# Patient Record
Sex: Male | Born: 1955 | Race: White | Hispanic: No | Marital: Married | Smoking: Former smoker
Health system: Southern US, Community
[De-identification: ages and names within clinical notes are randomized; demographics above are authoritative.]

## PROBLEM LIST (undated history)

## (undated) DIAGNOSIS — I82409 Acute embolism and thrombosis of unspecified deep veins of unspecified lower extremity: Secondary | ICD-10-CM

## (undated) DIAGNOSIS — J849 Interstitial pulmonary disease, unspecified: Secondary | ICD-10-CM

## (undated) DIAGNOSIS — D6851 Activated protein C resistance: Secondary | ICD-10-CM

## (undated) HISTORY — DX: Activated protein C resistance: D68.51

## (undated) HISTORY — DX: Acute embolism and thrombosis of unspecified deep veins of unspecified lower extremity: I82.409

## (undated) HISTORY — PX: LUNG TRANSPLANT: SHX234

## (undated) HISTORY — PX: KNEE SURGERY: SHX244

---
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
## 2016-12-26 ENCOUNTER — Emergency Department (HOSPITAL_COMMUNITY)
Admission: EM | Admit: 2016-12-26 | Discharge: 2016-12-26 | Disposition: A | Discharge status: Home / Self Care | Payer: 59 | Attending: Emergency Medicine | Admitting: Emergency Medicine

## 2016-12-26 ENCOUNTER — Encounter (HOSPITAL_COMMUNITY): Payer: Self-pay | Admitting: Emergency Medicine

## 2016-12-26 DIAGNOSIS — Z79899 Other long term (current) drug therapy: Secondary | ICD-10-CM | POA: Diagnosis not present

## 2016-12-26 DIAGNOSIS — M7989 Other specified soft tissue disorders: Secondary | ICD-10-CM

## 2016-12-26 DIAGNOSIS — F17200 Nicotine dependence, unspecified, uncomplicated: Secondary | ICD-10-CM | POA: Insufficient documentation

## 2016-12-26 DIAGNOSIS — I83891 Varicose veins of right lower extremities with other complications: Secondary | ICD-10-CM

## 2016-12-26 DIAGNOSIS — R2241 Localized swelling, mass and lump, right lower limb: Secondary | ICD-10-CM | POA: Insufficient documentation

## 2016-12-26 LAB — CBC WITH DIFFERENTIAL/PLATELET
BASOS ABS: 0 10*3/uL (ref 0.0–0.1)
BASOS PCT: 0 %
Eosinophils Absolute: 0.3 10*3/uL (ref 0.0–0.7)
Eosinophils Relative: 4 %
HEMATOCRIT: 42.1 % (ref 39.0–52.0)
HEMOGLOBIN: 14.8 g/dL (ref 13.0–17.0)
LYMPHS PCT: 18 %
Lymphs Abs: 1.5 10*3/uL (ref 0.7–4.0)
MCH: 29.4 pg (ref 26.0–34.0)
MCHC: 35.2 g/dL (ref 30.0–36.0)
MCV: 83.5 fL (ref 78.0–100.0)
MONO ABS: 1 10*3/uL (ref 0.1–1.0)
Monocytes Relative: 12 %
NEUTROS ABS: 5.6 10*3/uL (ref 1.7–7.7)
NEUTROS PCT: 66 %
Platelets: 193 10*3/uL (ref 150–400)
RBC: 5.04 MIL/uL (ref 4.22–5.81)
RDW: 13.7 % (ref 11.5–15.5)
WBC: 8.5 10*3/uL (ref 4.0–10.5)

## 2016-12-26 LAB — BASIC METABOLIC PANEL
ANION GAP: 6 (ref 5–15)
BUN: 21 mg/dL — ABNORMAL HIGH (ref 6–20)
CALCIUM: 9.1 mg/dL (ref 8.9–10.3)
CHLORIDE: 106 mmol/L (ref 101–111)
CO2: 28 mmol/L (ref 22–32)
Creatinine, Ser: 0.99 mg/dL (ref 0.61–1.24)
GFR calc non Af Amer: >60 mL/min (ref >60)
GLUCOSE: 98 mg/dL (ref 65–99)
POTASSIUM: 4.6 mmol/L (ref 3.5–5.1)
Sodium: 140 mmol/L (ref 135–145)

## 2016-12-26 MED ORDER — ENOXAPARIN SODIUM 150 MG/ML ~~LOC~~ SOLN
125.0000 mg | Freq: Once | SUBCUTANEOUS | Status: AC
  Administered 2016-12-26: 125 mg via SUBCUTANEOUS
  Filled 2016-12-26: qty 0.83

## 2016-12-26 NOTE — ED Triage Notes (Signed)
Patient c/o right lower leg swelling for a couple months. Came in today due to "a gust of bleeding from it." Patient has some varicose veins noted right above bandage. Bleeding controlled. No blood thinners.

## 2016-12-26 NOTE — ED Provider Notes (Signed)
[facility]-EMERGENCY DEPT Provider Note   CSN: [id number] Arrival date & time: 12/26/16  1924     History   Chief Complaint No chief complaint on file.   HPI Brent Holloway is a 61 y.o. male.  The history is provided by the patient.   CC: Swelling  Onset/Duration: 2-3 months Timing: Constant, worsening Location: Right lower extremity Severity: Moderate Modifying Factors:  Improved by: Nothing tried  Worsened by: Nothing tried Associated Signs/Symptoms:  Pertinent (+): New varicose veins which ruptured today; currently hemostatic  Pertinent (-): Pain, erythema. Chest pain, shortness of breath. Context: The patient denies any recent travels, surgeries, hormone replacement therapy, personal history of cancer, history of clotting disorders, no prior blood clots.  History reviewed. No pertinent past medical history.  There are no active problems to display for this patient.   Past Surgical History:  Procedure Laterality Date  . APPENDECTOMY  1976  . KNEE SURGERY Right        Home Medications    Prior to Admission medications   Medication Sig Start Date End Date Taking? Authorizing Provider  cetirizine (ZYRTEC) 10 MG tablet Take 10 mg by mouth daily.   Yes [provider]    Family History No family history on file.  Social History Social History  Substance Use Topics  . Smoking status: Current Every Day Smoker  . Smokeless tobacco: Not on file  . Alcohol use Not on file     Allergies   Patient has no known allergies.   Review of Systems Review of Systems  All other systems are reviewed and are negative for acute change except as noted in the HPI  Physical Exam Updated Vital Signs BP 125/83 (BP Location: Left Arm)   Pulse 78   Temp 97.8 F (36.6 C) (Oral)   Resp 18   SpO2 99%   Physical Exam  Constitutional: He is oriented to person, place, and time. He appears well-developed and well-nourished. No distress.  HENT:  Head: Normocephalic  and atraumatic.  Right Ear: External ear normal.  Left Ear: External ear normal.  Nose: Nose normal.  Mouth/Throat: Mucous membranes are normal. No trismus in the jaw.  Eyes: Conjunctivae and EOM are normal. No scleral icterus.  Neck: Normal range of motion and phonation normal.  Cardiovascular: Normal rate and regular rhythm.   Pulmonary/Chest: Effort normal. No stridor. No respiratory distress.  Abdominal: He exhibits no distension.  Musculoskeletal: Normal range of motion. He exhibits no edema.  Right lower extremity edema. Skin changes from prolonged edema. Varicose veins to the proximal aspect of the right lower leg, currently hemostatic. No erythema.  Neurological: He is alert and oriented to person, place, and time.  Skin: He is not diaphoretic.  Psychiatric: He has a normal mood and affect. His behavior is normal.  Vitals reviewed.    ED Treatments / Results  Labs (all labs ordered are listed, but only abnormal results are displayed) Labs Reviewed  BASIC METABOLIC PANEL - Abnormal; Notable for the following:       Result Value   BUN 21 (*)    All other components within normal limits  CBC WITH DIFFERENTIAL/PLATELET    EKG  EKG Interpretation None       Radiology No results found.  Procedures Procedures (including critical care time)  Medications Ordered in ED Medications  enoxaparin (LOVENOX) injection 125 mg (125 mg Subcutaneous Given 12/26/16 2331)     Initial Impression / Assessment and Plan / ED Course  I have  reviewed the triage vital signs and the nursing notes.  Pertinent labs & imaging results that were available during my care of the patient were reviewed by me and considered in my medical decision making (see chart for details).     Right lower leg swelling. Concerning for DVT. No evidence of compartment syndrome. Varicose veins hemostatic.  Unable to obtain a DVT ultrasound at this time. Screening labs obtained which revealed no evidence of  anemia, thrombocytopenia, or renal insufficiency.  Patient provided with a treatment dose of Lovenox. Varicose veins were bandaged. Patient provided with Pro- clot, in case the varicose veins bleed again.   [facility] for lower extremity DVT study in the morning.  The patient is safe for discharge with strict return precautions.   Final Clinical Impressions(s) / ED Diagnoses   Final diagnoses:  Right leg swelling  Varicose veins of right leg with edema   Disposition: Discharge  Condition: Good  I have discussed the results, Dx and Tx plan with the patient who expressed understanding and agree(s) with the plan. Discharge instructions discussed at great length. The patient was given strict return precautions who verbalized understanding of the instructions. No further questions at time of discharge.    New Prescriptions   No medications on file    Follow Up: [facility] [address] [place] [phone]    Brent LaddWong, Francis P, MD [address] [place] [address] [phone]  Schedule an appointment as soon as possible for a visit  if DVT study is negative      Cardama, Amadeo GarnetPedro Eduardo, MD 12/26/16 [phone]

## 2016-12-26 NOTE — ED Notes (Signed)
Cleansed right lower leg with normal saline and patted dry with gauze. Wrapped with kerlix and ace wrap.

## 2016-12-27 ENCOUNTER — Emergency Department (HOSPITAL_COMMUNITY): Admission: EM | Admit: 2016-12-27 | Payer: 59 | Source: Home / Self Care

## 2016-12-27 ENCOUNTER — Ambulatory Visit (HOSPITAL_BASED_OUTPATIENT_CLINIC_OR_DEPARTMENT_OTHER)
Admission: RE | Admit: 2016-12-27 | Discharge: 2016-12-27 | Disposition: A | Discharge status: Home / Self Care | Payer: 59 | Source: Ambulatory Visit | Attending: Emergency Medicine | Admitting: Emergency Medicine

## 2016-12-27 ENCOUNTER — Emergency Department (HOSPITAL_COMMUNITY)
Admission: EM | Admit: 2016-12-27 | Discharge: 2016-12-27 | Disposition: A | Discharge status: Home / Self Care | Payer: 59 | Attending: Emergency Medicine | Admitting: Emergency Medicine

## 2016-12-27 ENCOUNTER — Encounter (HOSPITAL_COMMUNITY): Payer: Self-pay | Admitting: Emergency Medicine

## 2016-12-27 ENCOUNTER — Encounter (HOSPITAL_COMMUNITY): Payer: Self-pay

## 2016-12-27 DIAGNOSIS — M7989 Other specified soft tissue disorders: Secondary | ICD-10-CM | POA: Insufficient documentation

## 2016-12-27 DIAGNOSIS — F17290 Nicotine dependence, other tobacco product, uncomplicated: Secondary | ICD-10-CM | POA: Diagnosis not present

## 2016-12-27 DIAGNOSIS — I824Z1 Acute embolism and thrombosis of unspecified deep veins of right distal lower extremity: Secondary | ICD-10-CM | POA: Diagnosis not present

## 2016-12-27 MED ORDER — RIVAROXABAN (XARELTO) EDUCATION KIT FOR DVT/PE PATIENTS
PACK | Freq: Once | Status: AC
  Administered 2016-12-27: 11:00:00
  Filled 2016-12-27: qty 1

## 2016-12-27 MED ORDER — RIVAROXABAN 15 MG PO TABS
15.0000 mg | ORAL_TABLET | Freq: Once | ORAL | Status: AC
  Administered 2016-12-27: 15 mg via ORAL
  Filled 2016-12-27: qty 1

## 2016-12-27 MED ORDER — RIVAROXABAN (XARELTO) VTE STARTER PACK (15 & 20 MG): ORAL_TABLET | ORAL | 0 refills | Status: DC

## 2016-12-27 NOTE — ED Triage Notes (Signed)
Pt [facility]. Sent here to have vascular study for possible DVT. Pt denies any long travel. Pt denies shortness of breath.

## 2016-12-27 NOTE — Progress Notes (Signed)
VASCULAR LAB PRELIMINARY  PRELIMINARY  PRELIMINARY  PRELIMINARY  Right lower extremity venous duplex completed.    Preliminary report:  There is subacute DVT noted in the right mid to distal femoral, popliteal, posterior tibial, and peroneal veins.  There is no propagation noted to the left lower extremity.   Koralyn Prestage, RVT 12/27/2016, 9:33 AM

## 2016-12-27 NOTE — ED Notes (Signed)
PT instructed to follow up with PCP for management and continuation of Xarelto. PT has no questions at discharge. PT instructed to return to ED for new or worsening symptoms.

## 2016-12-27 NOTE — ED Provider Notes (Signed)
This patient was here for an ultrasound study only. Has no new complaints at this time. Will return for encounter if US is positive for DVT.   SHOULD NOT BE CHARGED FOR THIS EMERGENCY DEPARTMENT ENCOUNTER.      Avice Funchess, Barbara CowerJason, MD 12/27/16 0900

## 2016-12-27 NOTE — ED Notes (Signed)
Pt scheduled to have vascular study done. Pt taken to lobby and ultrasound tech will come to get him.

## 2016-12-27 NOTE — ED Provider Notes (Signed)
[facility] DEPT Provider Note   CSN: [id number] Arrival date & time: 12/27/16  2297     History   Chief Complaint Chief Complaint  Patient presents with  . DVT    HPI Brent Holloway is a 61 y.o. male.   approximately 3 months of uchanged right lower extremtiy swelling with redness but no pain, fever, warmth. Seen at [facility] ED last night and returned for DVT study here today which was positive for DVT as seen below.  No leg pain, worsening swelling, chest pain, palpitations, sob or other new concerns. On review of records from [place] yesterday, kidney function ok, VS WNL.   History reviewed. No pertinent past medical history.  There are no active problems to display for this patient.   Past Surgical History:  Procedure Laterality Date  . APPENDECTOMY  1976  . KNEE SURGERY Right        Home Medications    Prior to Admission medications   Medication Sig Start Date End Date Taking? Authorizing Provider  cetirizine (ZYRTEC) 10 MG tablet Take 10 mg by mouth daily.    [provider]  Rivaroxaban 15 & 20 MG TBPK Take as directed on package: Start with one 46m tablet by mouth twice a day with food. On Day 22, switch to one 215mtablet once a day with food. 12/27/16   Carlean Crowl, JaCorene CorneaMD    Family History No family history on file.  Social History Social History  Substance Use Topics  . Smoking status: Current Every Day Smoker    Types: Cigars  . Smokeless tobacco: Never Used  . Alcohol use Yes     Comment: occ     Allergies   Patient has no known allergies.   Review of Systems Review of Systems  All other systems reviewed and are negative.    Physical Exam Updated Vital Signs BP 105/80   Pulse 74   Temp 98.7 F (37.1 C) (Oral)   Resp 16   Ht 6' (1.829 m)   Wt 124.7 kg (275 lb)   SpO2 97%   BMI 37.30 kg/m   Physical Exam  Constitutional: He appears well-developed and well-nourished.  HENT:  Head: Normocephalic and atraumatic.  Eyes:  Conjunctivae and EOM are normal.  Neck: Normal range of motion.  Cardiovascular: Normal rate.   Pulmonary/Chest: Effort normal. No respiratory distress.  Abdominal: Soft. He exhibits no distension.  Musculoskeletal: Normal range of motion. He exhibits edema (RLE with erythema, no tenderness). He exhibits no tenderness or deformity.  Neurological: He is alert.  Skin: Skin is warm and dry. There is erythema.  Nursing note and vitals reviewed.    ED Treatments / Results  Labs (all labs ordered are listed, but only abnormal results are displayed) Labs Reviewed - No data to display  EKG  EKG Interpretation None       Radiology No results found.  Procedures Procedures (including critical care time)  KaSharion Dove, RVS  Vascular Lab    VASCULAR LAB PRELIMINARY  PRELIMINARY  PRELIMINARY  PRELIMINARY  Right lower extremity venous duplex completed.    Preliminary report:  There is subacute DVT noted in the right mid to distal femoral, popliteal, posterior tibial, and peroneal veins.  There is no propagation noted to the left lower extremity.   KANADY, CANDACE, RVT 12/27/2016, 9:33 AM       Medications Ordered in ED Medications  rivaroxaban (XAlveda ReasonsEducation Kit for DVT/PE patients ( Does not apply Given 12/27/16 1040)  Rivaroxaban (XARELTO) tablet 15 mg (15 mg Oral Given 12/27/16 1040)     Initial Impression / Assessment and Plan / ED Course  I have reviewed the triage vital signs and the nursing notes.  Pertinent labs & imaging results that were available during my care of the patient were reviewed by me and considered in my medical decision making (see chart for details).    Positive DVT, no e/o PE. Been going on for 2-3 months, after review of vascular indciations for intervention, not a candidate at this time. No evidence of phlegmasia. Stable for dc on Xarelto. Will initiate here and starter pack Rx.    Final Clinical Impressions(s) / ED Diagnoses   Final  diagnoses:  Acute deep vein thrombosis (DVT) of distal vein of right lower extremity ([place])    New Prescriptions Discharge Medication List as of 12/27/2016 10:59 AM    START taking these medications   Details  Rivaroxaban 15 & 20 MG TBPK Take as directed on package: Start with one 6m tablet by mouth twice a day with food. On Day 22, switch to one 224mtablet once a day with food., Print         Sayyid Harewood, JaCorene CorneaMD 12/28/16 [phone]

## 2017-01-12 ENCOUNTER — Telehealth: Payer: Self-pay | Admitting: Hematology

## 2017-01-12 NOTE — Telephone Encounter (Signed)
Spoke with patient re 7/16 new patient appointment with Dr. Mosetta PuttFeng at 11 am to arrive 10:30 am. Demographic and insurance information confirmed.

## 2017-01-30 NOTE — Progress Notes (Signed)
[place]  [phone] [phone]  Clinic New Consult Note   Patient Care Team: Vernie Shanks, MD as PCP - General (Family Medicine) 02/02/2017  CHIEF COMPLAINTS/PURPOSE OF CONSULTATION:  subacute DVT of right LE  Referred By: Dr. Jacelyn Grip  HISTORY OF PRESENTING ILLNESS:  Brent Holloway 61 y.o. male is here because of subacute DVT of right lower extremity. He was referred by his primary care physician .he presents to my clinic by himself today.   He woke up one morning with pain and swelling down his right leg in late April 2018. The pain stopped spontaneously, but is swollen persistent. He did not seek medical attention until 12/27/2016, when his wife made him go to the hospital due to a popped blood vessel and  complaints of unchanged right lower leg swelling for the past three  months. He had a vascular study done that confirmed a subacute DVT in the right mid to distal femoral, popliteal, posterior tibial and peroneal veins. He denies any leg pain, worsening swelling, chest pains, palpitations, sob, fever or warmth while at the ED.  Today, he presents to the clinic to discuss treatment. His right leg is still slightly red and swollen. He has compression bandage. Denied any SOB or pain. He is a Dealer and works on trucks. He admits to sitting more than he should. He does household activities like mowing the lawn. He has been smoking cigar for about 20 years, 1-2 daily, which he is trying to stop. He denies any night sweats or itchiness.  He denies recent chest pain on exertion, shortness of breath on minimal exertion, pre-syncopal episodes, hemoptysis, or palpitation. he denies recent history of trauma,  long distance travel, dehydration, recent surgery, prolonged immobilization. He smokes Cigars a few daily he had no prior history or diagnosis of cancer. He  he had prior surgeries before and never had perioperative thromboembolic events. The patient  has never been exposed to hormone replacement therapy, testosterone replacement therapy and never had thrombotic events. There is no family history of blood clots or miscarriages.  MEDICAL HISTORY:  History reviewed. No pertinent past medical history.  SURGICAL HISTORY: Past Surgical History:  Procedure Laterality Date  . APPENDECTOMY  1976  . KNEE SURGERY Right     SOCIAL HISTORY: Social History   Social History  . Marital status: Married    Spouse name: N/A  . Number of children: N/A  . Years of education: N/A   Occupational History  . Not on file.   Social History Main Topics  . Smoking status: Current Every Day Smoker    Packs/day: 0.25    Years: 40.00    Types: Cigars  . Smokeless tobacco: Never Used  . Alcohol use Yes     Comment: occ  . Drug use: No  . Sexual activity: Not on file   Other Topics Concern  . Not on file   Social History Narrative  . No narrative on file    FAMILY HISTORY: Family History  Problem Relation Age of Onset  . Heart disease Father     ALLERGIES:  has No Known Allergies.  MEDICATIONS:  Current Outpatient Prescriptions  Medication Sig Dispense Refill  . cetirizine (ZYRTEC) 10 MG tablet Take 10 mg by mouth daily.    . Omega-3 Fatty Acids (FISH OIL) 1000 MG CAPS Take 1 capsule by mouth daily.    Alveda Reasons 20 MG TABS tablet Take 20 mg by mouth daily.  2  No current facility-administered medications for this visit.     REVIEW OF SYSTEMS:   Constitutional: Denies fevers, chills or abnormal night sweats Eyes: Denies blurriness of vision, double vision or watery eyes Ears, nose, mouth, throat, and face: Denies mucositis or sore throat Respiratory: Denies cough, dyspnea or wheezes Cardiovascular: Denies palpitation, chest discomfort or lower extremity swelling Gastrointestinal:  Denies nausea, heartburn or change in bowel habits Skin: Denies abnormal skin rashes Lymphatics: Denies new lymphadenopathy or easy  bruising Neurological:Denies numbness, tingling or new weaknesses Behavioral/Psych: Mood is stable, no new changes  MSK: (+)erythema and swelling of right leg All other systems were reviewed with the patient and are negative.  PHYSICAL EXAMINATION:  ECOG PERFORMANCE STATUS: 0 - Asymptomatic  Vitals:   02/02/17 1127  BP: (!) 154/82  Pulse: 71  Resp: 19  Temp: 98.1 F (36.7 C)   Filed Weights   02/02/17 1127  Weight: 287 lb 6.4 oz (130.4 kg)    GENERAL:alert, no distress and comfortable SKIN: skin color, texture, turgor are normal, no rashes or significant lesions (+) Diffuse mild skin erythema in the right lower extremity below knee. EYES: normal, conjunctiva are pink and non-injected, sclera clear OROPHARYNX:no exudate, no erythema and lips, buccal mucosa, and tongue normal  NECK: supple, thyroid normal size, non-tender, without nodularity LYMPH:  no palpable lymphadenopathy in the cervical, axillary or inguinal LUNGS: clear to auscultation and percussion with normal breathing effort HEART: regular rate & rhythm and no murmurs and no lower extremity edema ABDOMEN:abdomen soft, non-tender and normal bowel sounds  Musculoskeletal:no cyanosis of digits and no clubbing (+)erythema and swelling of right leg up to knee. PSYCH: alert & oriented x 3 with fluent speech NEURO: no focal motor/sensory deficits  LABORATORY DATA:  I have reviewed the data as listed Recent Results (from the past 2160 hour(s))  CBC with Differential/Platelet     Status: None   Collection Time: 12/26/16 10:02 PM  Result Value Ref Range   WBC 8.5 4.0 - 10.5 K/uL   RBC 5.04 4.22 - 5.81 MIL/uL   Hemoglobin 14.8 13.0 - 17.0 g/dL   HCT 42.1 39.0 - 52.0 %   MCV 83.5 78.0 - 100.0 fL   MCH 29.4 26.0 - 34.0 pg   MCHC 35.2 30.0 - 36.0 g/dL   RDW 13.7 11.5 - 15.5 %   Platelets 193 150 - 400 K/uL   Neutrophils Relative % 66 %   Neutro Abs 5.6 1.7 - 7.7 K/uL   Lymphocytes Relative 18 %   Lymphs Abs 1.5 0.7 -  4.0 K/uL   Monocytes Relative 12 %   Monocytes Absolute 1.0 0.1 - 1.0 K/uL   Eosinophils Relative 4 %   Eosinophils Absolute 0.3 0.0 - 0.7 K/uL   Basophils Relative 0 %   Basophils Absolute 0.0 0.0 - 0.1 K/uL  Basic metabolic panel     Status: Abnormal   Collection Time: 12/26/16 10:02 PM  Result Value Ref Range   Sodium 140 135 - 145 mmol/L   Potassium 4.6 3.5 - 5.1 mmol/L   Chloride 106 101 - 111 mmol/L   CO2 28 22 - 32 mmol/L   Glucose, Bld 98 65 - 99 mg/dL   BUN 21 (H) 6 - 20 mg/dL   Creatinine, Ser 0.99 0.61 - 1.24 mg/dL   Calcium 9.1 8.9 - 10.3 mg/dL   GFR calc non Af Amer >60 >60 mL/min   GFR calc Af Amer >60 >60 mL/min    Comment: (NOTE) The eGFR  has been calculated using the CKD EPI equation. This calculation has not been validated in all clinical situations. eGFR's persistently <60 mL/min signify possible Chronic Kidney Disease.    Anion gap 6 5 - 15    RADIOGRAPHIC STUDIES: I have personally reviewed the radiological images as listed and agreed with the findings in the report. No results found.  ASSESSMENT:  61 year old Caucasian male without past medical history, presented with right leg pain and edema  1. Unprovoked right lower extremity DVT, subacute   This is his first episode of thrombosis, unprovoked.   Hypercoagulopathy workup We discussed about the pros and cons about testing for thrombophilia disorder. his current on anticoagulation therapy will interfere with some the tests and it is not possible to interpret the test results. Taking him off the anticoagulation therapy to do the tests may precipitate another thrombotic event. I will recommend him to be tested for prothrombin gene mutation and factor V Leiden which are not interfered by his anticoagulation. I would consider to complete the rest of hypercoagulation workup after he is off anticoagulation.  We'll discussed risk factors for thrombosis, such as smoking, obesity, sedentary lifestyle, family  history, etc. His moderate smoking history and obesity are his risk factors. He is pretty physically active. Due to his age, never had cancer screening, unprovoked DVT, I recommend him to ruled out malignancy, we'll obtain a CT chest, abdomen and pelvis with contrast. His CBC is normal, no sings for myeloproliferative disorder. I also strongly encouraging him to have PSA test and screening colonoscopy for cancer screening at her primary care physician's office.  Duration of anticoagulation Giving the severity of his thrombosis, I recommend continue Xarelto for 6 months. If his factor V Leyden and a prothrombin gene mutation were negative, I will repeat a Doppler of right lower extremity in 6 months, if it is negative, will take him off Xarelto after 6 months of therapy, and do the rest of hypercoagulation workup. If he does have hypercoagulopathy, I would recommend continue anticoagulation indefinitely.   On the other hand, giving his unprovoked DVT and family history of thrombosis, it is not unreasonable to continue A/C indefinitely.  Anticoagulation options We discussed about various options of anticoagulation therapies including warfarin, low molecular weight heparin such as Lovenox or newer agents such as Rivaroxaban. Some of the risks and benefits discussed including costs involved, the need for monitoring, risks of life-threatening bleeding/hospitalization, reversibility of each agent in the event of bleeding or overdose, safety profile of each drug and taking into account other social issues such as ease of administration of medications, etc. Ultimately, we have made an informed decision for the patient to continue his treatment with Xarelto.  Other preventive strategy for DVT  I recommend the patient to use elastic compression stockings at 20-30 mmHg to reduce risks of chronic thrombophlebitis.  Finally, at the end of our consultation today, I reinforced the importance of preventive strategies  such as avoiding hormonal supplement, avoiding cigarette smoking, keeping up-to-date with screening programs for early cancer detection, frequent ambulation for long distance travel and aggressive DVT prophylaxis in all surgical settings.  Family counseling Another main issue we discussed today included the role of screening other family members for thrombophilia disorder. At present time, I would not recommend testing the patient's family members as it would not benefit them. He does not have children.  Recommendations:  - I strongly encouraged him to quit smoking, eat healthier, exercise and try to lose weight -Use compression stock to  reduce his swelling of the right leg, and prevent DVT  -We'll obtain a CT chest, abdomen and pelvis with contrast to ruled out malignancy. - I encouraged him to begin cancer screenings. I encouraged him to have PSA screening test and colonoscopy by Dr. Jacelyn Grip.  -He will f/u with Dr. Jacelyn Grip later this week. - I recommend staying on Xarelto for 6 months if repeated Doppler of right lower extremity becomes negative for DVT.  I will copy my note to Dr. Jacelyn Grip - f/u in 6 months with R LE Doppler one week before, lab today for LETs, prothrombin gene mutation and factor V Leyden mutation    Orders Placed This Encounter  Procedures  . CT Abdomen Pelvis W Contrast    Standing Status:   Future    Standing Expiration Date:   02/02/2018    Order Specific Question:   If indicated for the ordered procedure, I authorize the administration of contrast media per Radiology protocol    Answer:   Yes    Order Specific Question:   Reason for Exam (SYMPTOM  OR DIAGNOSIS REQUIRED)    Answer:   rule out malignancy    Order Specific Question:   Preferred imaging location?    Answer:   [facility]    Order Specific Question:   Radiology Contrast Protocol - do NOT remove file path    Answer:   \\[email or web address]  . CT Chest W Contrast    Standing  Status:   Future    Standing Expiration Date:   02/02/2018    Order Specific Question:   If indicated for the ordered procedure, I authorize the administration of contrast media per Radiology protocol    Answer:   Yes    Order Specific Question:   Reason for Exam (SYMPTOM  OR DIAGNOSIS REQUIRED)    Answer:   rule out malignancy    Order Specific Question:   Preferred imaging location?    Answer:   [facility]    Order Specific Question:   Radiology Contrast Protocol - do NOT remove file path    Answer:   \\[email or web address]  . Hepatic function panel    Standing Status:   Future    Number of Occurrences:   1    Standing Expiration Date:   02/02/2018  . Prothrombin gene mutation*    Standing Status:   Future    Number of Occurrences:   1    Standing Expiration Date:   02/02/2018  . Factor 5 Leiden*    Standing Status:   Future    Number of Occurrences:   1    Standing Expiration Date:   02/02/2018    All questions were answered. The patient knows to call the clinic with any problems, questions or concerns. I spent 40 minutes counseling the patient face to face. The total time spent in the appointment was 45 minutes and more than 50% was on counseling.   This document serves as a record of services personally performed by Truitt Merle, MD. It was created on her behalf by Brandt Loosen, a trained medical scribe. The creation of this record is based on the scribe's personal observations and the provider's statements to them. This document has been checked and approved by the attending provider.   Truitt Merle, MD 02/02/2017

## 2017-02-02 ENCOUNTER — Other Ambulatory Visit (HOSPITAL_BASED_OUTPATIENT_CLINIC_OR_DEPARTMENT_OTHER): Payer: 59

## 2017-02-02 ENCOUNTER — Encounter: Payer: Self-pay | Admitting: Hematology

## 2017-02-02 ENCOUNTER — Ambulatory Visit (HOSPITAL_BASED_OUTPATIENT_CLINIC_OR_DEPARTMENT_OTHER): Payer: 59 | Admitting: Hematology

## 2017-02-02 ENCOUNTER — Encounter (INDEPENDENT_AMBULATORY_CARE_PROVIDER_SITE_OTHER): Payer: Self-pay

## 2017-02-02 ENCOUNTER — Telehealth: Payer: Self-pay | Admitting: Hematology

## 2017-02-02 DIAGNOSIS — I82431 Acute embolism and thrombosis of right popliteal vein: Secondary | ICD-10-CM

## 2017-02-02 DIAGNOSIS — I82501 Chronic embolism and thrombosis of unspecified deep veins of right lower extremity: Secondary | ICD-10-CM | POA: Insufficient documentation

## 2017-02-02 DIAGNOSIS — I82441 Acute embolism and thrombosis of right tibial vein: Secondary | ICD-10-CM

## 2017-02-02 DIAGNOSIS — I82890 Acute embolism and thrombosis of other specified veins: Secondary | ICD-10-CM | POA: Diagnosis not present

## 2017-02-02 DIAGNOSIS — I82411 Acute embolism and thrombosis of right femoral vein: Secondary | ICD-10-CM

## 2017-02-02 NOTE — Telephone Encounter (Signed)
Gv pt appt for Jan 2019. Advised radiology will call to sched ct in 2-3 wks per 7/16 los and they will call to sched doppler LLE prior to Jan MD visit. Gv pt contrast and sent to labs for draw.

## 2017-02-03 LAB — HEPATIC FUNCTION PANEL
ALK PHOS: 71 IU/L (ref 39–117)
ALT: 12 IU/L (ref 0–44)
AST (SGOT): 14 IU/L (ref 0–40)
Albumin, Serum: 4.5 g/dL (ref 3.6–4.8)
Bilirubin Total: 0.4 mg/dL (ref 0.0–1.2)
Bilirubin, Direct: 0.12 mg/dL (ref 0.00–0.40)
TOTAL PROTEIN: 6.7 g/dL (ref 6.0–8.5)

## 2017-02-07 LAB — FACTOR 5 LEIDEN

## 2017-02-07 LAB — PROTHROMBIN GENE MUTATION

## 2017-02-23 ENCOUNTER — Telehealth: Payer: Self-pay | Admitting: Hematology

## 2017-02-23 ENCOUNTER — Other Ambulatory Visit: Payer: Self-pay | Admitting: *Deleted

## 2017-02-23 DIAGNOSIS — I82501 Chronic embolism and thrombosis of unspecified deep veins of right lower extremity: Secondary | ICD-10-CM

## 2017-02-23 NOTE — Telephone Encounter (Signed)
sw pt to confirm lab appt 8/9 at 0800 per sch msg

## 2017-02-26 ENCOUNTER — Ambulatory Visit (HOSPITAL_COMMUNITY)
Admission: RE | Admit: 2017-02-26 | Discharge: 2017-02-26 | Disposition: A | Discharge status: Home / Self Care | Payer: 59 | Source: Ambulatory Visit | Attending: Hematology | Admitting: Hematology

## 2017-02-26 ENCOUNTER — Other Ambulatory Visit (HOSPITAL_BASED_OUTPATIENT_CLINIC_OR_DEPARTMENT_OTHER): Payer: 59

## 2017-02-26 DIAGNOSIS — I82501 Chronic embolism and thrombosis of unspecified deep veins of right lower extremity: Secondary | ICD-10-CM

## 2017-02-26 DIAGNOSIS — I700 Atherosclerosis of aorta: Secondary | ICD-10-CM | POA: Insufficient documentation

## 2017-02-26 DIAGNOSIS — R590 Localized enlarged lymph nodes: Secondary | ICD-10-CM | POA: Insufficient documentation

## 2017-02-26 DIAGNOSIS — R918 Other nonspecific abnormal finding of lung field: Secondary | ICD-10-CM | POA: Insufficient documentation

## 2017-02-26 DIAGNOSIS — K449 Diaphragmatic hernia without obstruction or gangrene: Secondary | ICD-10-CM | POA: Diagnosis not present

## 2017-02-26 DIAGNOSIS — I82411 Acute embolism and thrombosis of right femoral vein: Secondary | ICD-10-CM

## 2017-02-26 LAB — BASIC METABOLIC PANEL
Anion Gap: 10 mEq/L (ref 3–11)
BUN: 17.6 mg/dL (ref 7.0–26.0)
CHLORIDE: 104 meq/L (ref 98–109)
CO2: 25 meq/L (ref 22–29)
Calcium: 9.4 mg/dL (ref 8.4–10.4)
Creatinine: 1 mg/dL (ref 0.7–1.3)
EGFR: 79 mL/min/{1.73_m2} — AB (ref >90)
Glucose: 100 mg/dl (ref 70–140)
POTASSIUM: 4.5 meq/L (ref 3.5–5.1)
SODIUM: 139 meq/L (ref 136–145)

## 2017-02-26 MED ORDER — IOPAMIDOL (ISOVUE-300) INJECTION 61%
INTRAVENOUS | Status: AC
  Filled 2017-02-26: qty 100

## 2017-02-26 MED ORDER — IOPAMIDOL (ISOVUE-300) INJECTION 61%
100.0000 mL | Freq: Once | INTRAVENOUS | Status: AC | PRN
  Administered 2017-02-26: 100 mL via INTRAVENOUS

## 2017-03-09 ENCOUNTER — Telehealth: Payer: Self-pay | Admitting: Hematology

## 2017-03-09 NOTE — Telephone Encounter (Signed)
sw pt to confirm 8/21 appt at 0915 per sch msg

## 2017-03-10 ENCOUNTER — Ambulatory Visit (HOSPITAL_BASED_OUTPATIENT_CLINIC_OR_DEPARTMENT_OTHER): Payer: 59 | Admitting: Hematology

## 2017-03-10 ENCOUNTER — Telehealth: Payer: Self-pay | Admitting: Hematology

## 2017-03-10 ENCOUNTER — Encounter: Payer: Self-pay | Admitting: Hematology

## 2017-03-10 VITALS — BP 135/83 | HR 73 | Temp 97.7°F | Resp 20 | Ht 72.0 in | Wt 291.1 lb

## 2017-03-10 DIAGNOSIS — M7989 Other specified soft tissue disorders: Secondary | ICD-10-CM | POA: Diagnosis not present

## 2017-03-10 DIAGNOSIS — I82441 Acute embolism and thrombosis of right tibial vein: Secondary | ICD-10-CM

## 2017-03-10 DIAGNOSIS — R918 Other nonspecific abnormal finding of lung field: Secondary | ICD-10-CM

## 2017-03-10 DIAGNOSIS — F17200 Nicotine dependence, unspecified, uncomplicated: Secondary | ICD-10-CM | POA: Diagnosis not present

## 2017-03-10 DIAGNOSIS — R591 Generalized enlarged lymph nodes: Secondary | ICD-10-CM

## 2017-03-10 DIAGNOSIS — I82890 Acute embolism and thrombosis of other specified veins: Secondary | ICD-10-CM

## 2017-03-10 DIAGNOSIS — R599 Enlarged lymph nodes, unspecified: Secondary | ICD-10-CM

## 2017-03-10 DIAGNOSIS — D6851 Activated protein C resistance: Secondary | ICD-10-CM | POA: Diagnosis not present

## 2017-03-10 DIAGNOSIS — I82411 Acute embolism and thrombosis of right femoral vein: Secondary | ICD-10-CM

## 2017-03-10 DIAGNOSIS — I82431 Acute embolism and thrombosis of right popliteal vein: Secondary | ICD-10-CM

## 2017-03-10 DIAGNOSIS — J849 Interstitial pulmonary disease, unspecified: Secondary | ICD-10-CM

## 2017-03-10 NOTE — Telephone Encounter (Signed)
Scheduled appt per 8/21 los - gave patient AVS and calender per los.  

## 2017-03-10 NOTE — Progress Notes (Signed)
[facility]  [phone] [phone]  Clinic Follow up Note   Patient Care Team: Vernie Shanks, MD as PCP - General (Family Medicine) 03/10/2017  CHIEF COMPLAINTS:  F/u subacute DVT of right LE, factor V Leyden mutation positive  HISTORY OF PRESENTING ILLNESS: 02/02/17 Brent Holloway 61 y.o. male is here because of subacute DVT of right lower extremity. He was referred by his primary care physician .he presents to my clinic by himself today.   He woke up one morning with pain and swelling down his right leg in late April 2018. The pain stopped spontaneously, but is swollen persistent. He did not seek medical attention until 12/27/2016, when his wife made him go to the hospital due to a popped blood vessel and  complaints of unchanged right lower leg swelling for the past three  months. He had a vascular study done that confirmed a subacute DVT in the right mid to distal femoral, popliteal, posterior tibial and peroneal veins. He denies any leg pain, worsening swelling, chest pains, palpitations, sob, fever or warmth while at the ED.  Today, he presents to the clinic to discuss treatment. His right leg is still slightly red and swollen. He has compression bandage. Denied any SOB or pain. He is a Dealer and works on trucks. He admits to sitting more than he should. He does household activities like mowing the lawn. He has been smoking cigar for about 20 years, 1-2 daily, which he is trying to stop. He denies any night sweats or itchiness.  He denies recent chest pain on exertion, shortness of breath on minimal exertion, pre-syncopal episodes, hemoptysis, or palpitation. he denies recent history of trauma,  long distance travel, dehydration, recent surgery, prolonged immobilization. He smokes Cigars a few daily he had no prior history or diagnosis of cancer. He  he had prior surgeries before and never had perioperative thromboembolic events. The patient has  never been exposed to hormone replacement therapy, testosterone replacement therapy and never had thrombotic events. There is no family history of blood clots or miscarriages.    CURRENT THERAPY:  Xarelto 4m once dialy   INTERVAL HISTORY:  Brent Holloway here for a follow up. He presents to the clinic today with his wife. He reports his right leg is still swollen some but no pain. In the morning he will cough aggressively and his nose will run. He reports to recently stopping smoking. They are willing to notify family of positive gene mutation.    MEDICAL HISTORY:  History reviewed. No pertinent past medical history.  SURGICAL HISTORY: Past Surgical History:  Procedure Laterality Date  . APPENDECTOMY  1976  . KNEE SURGERY Right     SOCIAL HISTORY: Social History   Social History  . Marital status: Married    Spouse name: N/A  . Number of children: N/A  . Years of education: N/A   Occupational History  . Not on file.   Social History Main Topics  . Smoking status: Current Every Day Smoker    Packs/day: 0.25    Years: 40.00    Types: Cigars  . Smokeless tobacco: Never Used  . Alcohol use Yes     Comment: occ  . Drug use: No  . Sexual activity: Not on file   Other Topics Concern  . Not on file   Social History Narrative  . No narrative on file    FAMILY HISTORY: Family History  Problem Relation Age of Onset  . Heart  disease Father     ALLERGIES:  has No Known Allergies.  MEDICATIONS:  Current Outpatient Prescriptions  Medication Sig Dispense Refill  . cetirizine (ZYRTEC) 10 MG tablet Take 10 mg by mouth daily.    . Omega-3 Fatty Acids (FISH OIL) 1000 MG CAPS Take 1 capsule by mouth daily.    Alveda Reasons 20 MG TABS tablet Take 20 mg by mouth daily.  2   No current facility-administered medications for this visit.     REVIEW OF SYSTEMS:   Constitutional: Denies fevers, chills or abnormal night sweats Eyes: Denies blurriness of vision, double vision  or watery eyes Ears, nose, mouth, throat, and face: Denies mucositis or sore throat Respiratory: Denies dyspnea or wheezes  (+) coughing and runny nose mostly in mornings Cardiovascular: Denies palpitation, chest discomfort or lower extremity swelling Gastrointestinal:  Denies nausea, heartburn or change in bowel habits Skin: Denies abnormal skin rashes Lymphatics: Denies new lymphadenopathy or easy bruising Neurological:Denies numbness, tingling or new weaknesses Behavioral/Psych: Mood is stable, no new changes  MSK: (+)erythema and swelling of right leg All other systems were reviewed with the patient and are negative.  PHYSICAL EXAMINATION:  ECOG PERFORMANCE STATUS: 0 - Asymptomatic  Vitals:   03/10/17 0925  BP: 135/83  Pulse: 73  Resp: 20  Temp: 97.7 F (36.5 C)  SpO2: 99%   Filed Weights   03/10/17 0925  Weight: 291 lb 1.6 oz (132 kg)    GENERAL:alert, no distress and comfortable SKIN: skin color, texture, turgor are normal, no rashes or significant lesions (+) Diffuse mild skin erythema in the right lower extremity below knee. EYES: normal, conjunctiva are pink and non-injected, sclera clear OROPHARYNX:no exudate, no erythema and lips, buccal mucosa, and tongue normal  NECK: supple, thyroid normal size, non-tender, without nodularity LYMPH:  no palpable lymphadenopathy in the cervical, axillary or inguinal LUNGS: clear to auscultation and percussion with normal breathing effort HEART: regular rate & rhythm and no murmurs and no lower extremity edema ABDOMEN:abdomen soft, non-tender and normal bowel sounds  Musculoskeletal:no cyanosis of digits and no clubbing (+)erythema and swelling of right leg up to knee. PSYCH: alert & oriented x 3 with fluent speech NEURO: no focal motor/sensory deficits  LABORATORY DATA:  I have reviewed the data as listed Recent Results (from the past 2160 hour(s))  CBC with Differential/Platelet     Status: None   Collection Time: 12/26/16  10:02 PM  Result Value Ref Range   WBC 8.5 4.0 - 10.5 K/uL   RBC 5.04 4.22 - 5.81 MIL/uL   Hemoglobin 14.8 13.0 - 17.0 g/dL   HCT 42.1 39.0 - 52.0 %   MCV 83.5 78.0 - 100.0 fL   MCH 29.4 26.0 - 34.0 pg   MCHC 35.2 30.0 - 36.0 g/dL   RDW 13.7 11.5 - 15.5 %   Platelets 193 150 - 400 K/uL   Neutrophils Relative % 66 %   Neutro Abs 5.6 1.7 - 7.7 K/uL   Lymphocytes Relative 18 %   Lymphs Abs 1.5 0.7 - 4.0 K/uL   Monocytes Relative 12 %   Monocytes Absolute 1.0 0.1 - 1.0 K/uL   Eosinophils Relative 4 %   Eosinophils Absolute 0.3 0.0 - 0.7 K/uL   Basophils Relative 0 %   Basophils Absolute 0.0 0.0 - 0.1 K/uL  Basic metabolic panel     Status: Abnormal   Collection Time: 12/26/16 10:02 PM  Result Value Ref Range   Sodium 140 135 - 145 mmol/L  Potassium 4.6 3.5 - 5.1 mmol/L   Chloride 106 101 - 111 mmol/L   CO2 28 22 - 32 mmol/L   Glucose, Bld 98 65 - 99 mg/dL   BUN 21 (H) 6 - 20 mg/dL   Creatinine, Ser 0.99 0.61 - 1.24 mg/dL   Calcium 9.1 8.9 - 10.3 mg/dL   GFR calc non Af Amer >60 >60 mL/min   GFR calc Af Amer >60 >60 mL/min    Comment: (NOTE) The eGFR has been calculated using the CKD EPI equation. This calculation has not been validated in all clinical situations. eGFR's persistently <60 mL/min signify possible Chronic Kidney Disease.    Anion gap 6 5 - 15  Hepatic function panel     Status: None   Collection Time: 02/02/17 12:56 PM  Result Value Ref Range   Total Protein 6.7 6.0 - 8.5 g/dL   Albumin, Serum 4.5 3.6 - 4.8 g/dL   Bilirubin Total 0.4 0.0 - 1.2 mg/dL   Bilirubin, Direct 0.12 0.00 - 0.40 mg/dL   Alkaline Phosphatase, S 71 39 - 117 IU/L   AST (SGOT) 14 0 - 40 IU/L   ALT 12 0 - 44 IU/L  Prothrombin gene mutation*     Status: None   Collection Time: 02/02/17 12:56 PM  Result Value Ref Range   Factor II, DNA Analysis Comment     Comment: NEGATIVE No mutation identified. Comment: A point mutation (G20210A) in the factor II (prothrombin) gene is the second  most common cause of inherited thrombophilia. The incidence of this mutation in the [place] Caucasian population is about 2% and in the [place] American population it is approximately 0.5%. This mutation is rare in the [place] and Native American population. Being heterozygous for a prothrombin mutation increases the risk for developing venous thrombosis about 2 to 3 times above the general population risk. Being homozygous for the prothrombin gene mutation increases the relative risk for venous thrombosis further, although it is not yet known how much further the risk is increased. In women heterozygous for the prothrombin gene mutation, the use of estrogen containing oral contraceptives increases the relative risk of venous thrombosis about 16 times and the risk of developing cerebral thrombosis is also significantly increased. In pregnancy the prothrombin  gene mutation increases risk for venous thrombosis and may increase risk for stillbirth, placental abruption, pre-eclampsia and fetal growth restriction. If the patient possesses two or more congenital or acquired thrombophilic risk factors, the risk for thrombosis may rise to more than the sum of the risk ratios for the individual mutations. This assay detects only the prothrombin G20210A mutation and does not measure genetic abnormalities elsewhere in the genome. Other thrombotic risk factors may be pursued through systematic clinical laboratory analysis. These factors include the R506Q (Leiden) mutation in the Factor V gene, plasma homocysteine levels, as well as testing for deficiencies of antithrombin III, protein C and protein S. Genetic Counselors are available for health care providers to discuss results at [phone] [phone]). Methodology: DNA analysis of the Factor II gene was performed by PCR amplification followed by restriction analysis. The diagnostic  sensitivity is >99% for both. All the tests must be combined with  clinical information for the most accurate interpretation. Molecular-based testing is highly accurate, but as in any laboratory test, diagnostic errors may occur. This test was developed and its performance characteristics determined by [facility]. It has not been cleared or approved by the Food and Drug Administration. Poort SR, et al. Blood. 1996; 16:3845-3646.  Varga EA. Circulation. 2004; 334:D56-Y61. Mervin Hack, et Redbird Smith; 19:700-703. Allison Quarry, PhD, [facility] Ruben Reason, PhD, [facility] Annetta Maw, M.S., PhD, [facility] Alfredo Bach, PhD, [facility] Norva Riffle, PhD, [facility] Earlean Polka, PhD, [facility]   Factor 5 Leiden*     Status: Abnormal   Collection Time: 02/02/17 12:56 PM  Result Value Ref Range   Factor V Leiden Comment (A)     Comment: RESULT: SINGLE R506Q MUTATION IDENTIFIED (HETEROZYGOTE) Factor V Leiden is a specific mutation (R506Q) in the factor V gene that is associated with an increased risk of venous thrombosis. Factor V Leiden is more resistant to inactivation by activated protein C.  As a result, factor V persists in the circulation leading to a mild hypercoagulable state.  Factor V Leiden has been reported in patients with deep vein thrombosis, pulmonary embolus, central retinal vein occulsion, cerebral sinus thrombosis, and hepatic vein thrombosis. The relative risk of venous thrombosis is increased approximately 4-8 fold in individuals who are heterozygous.  About 3-8% of the general [place] and European population are heterozygous.  The risk of venous thrombosis increases exponentially in patients with more than one risk factor, including:  age, surgery, oral contraceptive use, pregnancy, elevated homocysteine levels, or a Factor II/prothrombin mutation (G20210A).  Additionally, for individuals  found to be heterozygous for the Factor V Leiden mutation, presence of a second mutation, Factor V R2, further increases the risk  if venous thrombosis. Contact [facility]'s Genetics Customer Service at [phone] for further information on both the Factor II (Prothrombin) DNA Analysis, and Factor V R2 DNA Analysis tests. **Genetic counselors are available for health care providers to**   discuss results at [phone] [phone]). Methodology: DNA analysis of the Factor V gene was performed by allele-specific PCR. The diagnostic sensitivity and specificity is >99% for both. Molecular-based testing is highly accurate, but as in any laboratory test, diagnostic errors may occur. All test results must be combined with clinical information for the most accurate interpretation. This test was developed and its performance characteristics determined by [facility]. It has not been cleared or approved by the Food and Drug Administration. References: Voelkerding K (1996).  Clin Lab  Med [phone]. Allison Quarry, PhD, [facility] Ruben Reason, PhD, [facility] Annetta Maw, M.S., PhD, [facility] Alfredo Bach, PhD, [facility] Norva Riffle, PhD, [facility] Earlean Polka PhD, [facility]   Basic metabolic panel     Status: Abnormal   Collection Time: 02/26/17  8:25 AM  Result Value Ref Range   Sodium 139 136 - 145 mEq/L   Potassium 4.5 3.5 - 5.1 mEq/L   Chloride 104 98 - 109 mEq/L   CO2 25 22 - 29 mEq/L   Glucose 100 70 - 140 mg/dl    Comment: Glucose reference range is for nonfasting patients. Fasting glucose reference range is 70- 100.   BUN 17.6 7.0 - 26.0 mg/dL   Creatinine 1.0 0.7 - 1.3 mg/dL   Calcium 9.4 8.4 - 10.4 mg/dL   Anion Gap 10 3 - 11 mEq/L   EGFR 79 (L) >90 ml/min/1.73 m2    Comment: eGFR is calculated using the CKD-EPI Creatinine Equation (2009)   PATHOLOGY   Factor 5 Leiden 02/02/17 Order: [id number]  Status:  Final result  Visible to patient:  No (Not Released)  Next appt:  07/06/2017 at 09:00 AM in Vascular Surgery ([facility] Vasc US 1)  Dx:  Leg DVT (deep venous thromboembolism)...  Component 42moago  Factor V Leiden  Comment  Comment: RESULT: SINGLE R506Q MUTATION IDENTIFIED (HETEROZYGOTE)          RADIOGRAPHIC STUDIES: I have personally reviewed the radiological images as listed and agreed with the findings in the report. Ct Chest W Contrast  Result Date: 02/26/2017 CLINICAL DATA:  Recent diagnosis of unprovoked subacute deep venous thrombosis in the right lower extremity. Clinical request to evaluate for malignancy. History of appendectomy. EXAM: CT CHEST, ABDOMEN, AND PELVIS WITH CONTRAST TECHNIQUE: Multidetector CT imaging of the chest, abdomen and pelvis was performed following the standard protocol during bolus administration of intravenous contrast. CONTRAST:  168m ISOVUE-300 IOPAMIDOL (ISOVUE-300) INJECTION 61% COMPARISON:  None. FINDINGS: CT CHEST FINDINGS Cardiovascular: Normal heart size. No significant pericardial fluid/thickening. Mildly atherosclerotic nonaneurysmal thoracic aorta. Normal caliber pulmonary arteries. No central pulmonary emboli. Mediastinum/Nodes: No discrete thyroid nodules. Unremarkable esophagus. No pathologically enlarged axillary, mediastinal or hilar lymph nodes. Lungs/Pleura: No pneumothorax. No pleural effusion. No acute consolidative airspace disease or lung masses. There a few scattered solid pulmonary nodules in the right upper lobe, largest 6 mm (series 6/image 34). There is patchy confluent subpleural reticulation and ground-glass attenuation throughout both lungs with associated mild traction bronchiectasis. There is a basilar gradient to these findings. No frank honeycombing. Musculoskeletal: No aggressive appearing focal osseous lesions. Mild thoracic spondylosis. CT ABDOMEN PELVIS FINDINGS Hepatobiliary: There are several simple liver cysts scattered throughout the liver, largest 12.6 cm in the inferior right liver lobe. There are several subcentimeter hypodense lesions scattered throughout the liver, too small to characterize. Normal gallbladder with no radiopaque  cholelithiasis. No biliary ductal dilatation. Pancreas: Normal, with no mass or duct dilation. Spleen: Normal size. No mass. Adrenals/Urinary Tract: Normal adrenals. Exophytic simple 1.3 cm renal cyst in the lateral upper left kidney. No additional renal lesions. No hydronephrosis. Normal bladder. Stomach/Bowel: Small hiatal hernia. Otherwise collapsed and grossly normal stomach. Normal caliber small bowel with no small bowel wall thickening. Appendectomy. Normal large bowel with no diverticulosis, large bowel wall thickening or pericolonic fat stranding. Oral contrast progresses to the rectum. Vascular/Lymphatic: Atherosclerotic nonaneurysmal abdominal aorta. Patent portal, splenic, hepatic and renal veins. Asymmetric mildly enlarged 1.9 cm right inguinal node (series 2/ image 130). No additional pathologically enlarged abdominopelvic nodes. Reproductive: Top-normal size prostate. Other: No pneumoperitoneum, ascites or focal fluid collection. Musculoskeletal: No aggressive appearing focal osseous lesions. Moderate to marked lumbar spondylosis. IMPRESSION: 1. Nonspecific asymmetric mild right inguinal lymphadenopathy, which is probably amenable to ultrasound-guided biopsy if clinically warranted. Alternatively, a follow-up CT of the abdomen and pelvis with IV and oral contrast could be obtained in 3 months to assess stability. 2. Nonspecific scattered right upper lobe solid pulmonary nodules, largest 6 mm. Non-contrast chest CT at 3-6 months is recommended. If the nodules are stable at time of repeat CT, then future CT at 18-24 months (from today's scan) is considered optional for low-risk patients, but is recommended for high-risk patients. This recommendation follows the consensus statement: Guidelines for Management of Incidental Pulmonary Nodules Detected on CT Images: From the Fleischner Society 2017; Radiology 2017; 284:228-243. 3. 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 Differential includes early usual interstitial pneumonia (UIP) or fibrotic nonspecific interstitial pneumonia (NSIP). Consider pulmonology consultation and follow-up high-resolution chest CT study in 6-12 months, as clinically warranted . 4.  Aortic Atherosclerosis (ICD10-I70.0). 5. Small hiatal hernia. Electronically Signed   By: JIlona SorrelM.D.   On: 02/26/2017 13:33   Ct Abdomen Pelvis W Contrast  Result Date: 02/26/2017  CLINICAL DATA:  Recent diagnosis of unprovoked subacute deep venous thrombosis in the right lower extremity. Clinical request to evaluate for malignancy. History of appendectomy. EXAM: CT CHEST, ABDOMEN, AND PELVIS WITH CONTRAST TECHNIQUE: Multidetector CT imaging of the chest, abdomen and pelvis was performed following the standard protocol during bolus administration of intravenous contrast. CONTRAST:  147m ISOVUE-300 IOPAMIDOL (ISOVUE-300) INJECTION 61% COMPARISON:  None. FINDINGS: CT CHEST FINDINGS Cardiovascular: Normal heart size. No significant pericardial fluid/thickening. Mildly atherosclerotic nonaneurysmal thoracic aorta. Normal caliber pulmonary arteries. No central pulmonary emboli. Mediastinum/Nodes: No discrete thyroid nodules. Unremarkable esophagus. No pathologically enlarged axillary, mediastinal or hilar lymph nodes. Lungs/Pleura: No pneumothorax. No pleural effusion. No acute consolidative airspace disease or lung masses. There a few scattered solid pulmonary nodules in the right upper lobe, largest 6 mm (series 6/image 34). There is patchy confluent subpleural reticulation and ground-glass attenuation throughout both lungs with associated mild traction bronchiectasis. There is a basilar gradient to these findings. No frank honeycombing. Musculoskeletal: No aggressive appearing focal osseous lesions. Mild thoracic spondylosis. CT ABDOMEN PELVIS FINDINGS Hepatobiliary: There are several simple liver cysts scattered  throughout the liver, largest 12.6 cm in the inferior right liver lobe. There are several subcentimeter hypodense lesions scattered throughout the liver, too small to characterize. Normal gallbladder with no radiopaque cholelithiasis. No biliary ductal dilatation. Pancreas: Normal, with no mass or duct dilation. Spleen: Normal size. No mass. Adrenals/Urinary Tract: Normal adrenals. Exophytic simple 1.3 cm renal cyst in the lateral upper left kidney. No additional renal lesions. No hydronephrosis. Normal bladder. Stomach/Bowel: Small hiatal hernia. Otherwise collapsed and grossly normal stomach. Normal caliber small bowel with no small bowel wall thickening. Appendectomy. Normal large bowel with no diverticulosis, large bowel wall thickening or pericolonic fat stranding. Oral contrast progresses to the rectum. Vascular/Lymphatic: Atherosclerotic nonaneurysmal abdominal aorta. Patent portal, splenic, hepatic and renal veins. Asymmetric mildly enlarged 1.9 cm right inguinal node (series 2/ image 130). No additional pathologically enlarged abdominopelvic nodes. Reproductive: Top-normal size prostate. Other: No pneumoperitoneum, ascites or focal fluid collection. Musculoskeletal: No aggressive appearing focal osseous lesions. Moderate to marked lumbar spondylosis. IMPRESSION: 1. Nonspecific asymmetric mild right inguinal lymphadenopathy, which is probably amenable to ultrasound-guided biopsy if clinically warranted. Alternatively, a follow-up CT of the abdomen and pelvis with IV and oral contrast could be obtained in 3 months to assess stability. 2. Nonspecific scattered right upper lobe solid pulmonary nodules, largest 6 mm. Non-contrast chest CT at 3-6 months is recommended. If the nodules are stable at time of repeat CT, then future CT at 18-24 months (from today's scan) is considered optional for low-risk patients, but is recommended for high-risk patients. This recommendation follows the consensus statement:  Guidelines for Management of Incidental Pulmonary Nodules Detected on CT Images: From the Fleischner Society 2017; Radiology 2017; 284:228-243. 3. Spectrum of findings suggestive of basilar predominant fibrotic interstitial lung disease, without frank honeycombing at this time. Differential includes early usual interstitial pneumonia (UIP) or fibrotic nonspecific interstitial pneumonia (NSIP). Consider pulmonology consultation and follow-up high-resolution chest CT study in 6-12 months, as clinically warranted . 4.  Aortic Atherosclerosis (ICD10-I70.0). 5. Small hiatal hernia. Electronically Signed   By: JIlona SorrelM.D.   On: 02/26/2017 13:33    ASSESSMENT:  61year old Caucasian male without past medical history, presented with right leg pain and edema  1. Unprovoked right lower extremity DVT, subacute -I reviewed her lab tests results, which showed positive factor V Leyden mutation, heterozygous. This is a high risk for thrombosis, increased risk of thrombosis by 3-4  times. - I recommend continue Xarelto indefinitely   --I previously recommended the patient to use elastic compression stockings at 20-30 mmHg to reduce risks of chronic thrombophlebitis. -I again reinforced the importance of preventive strategies such as avoiding hormonal supplement, avoiding cigarette smoking, keeping up-to-date with screening programs for early cancer detection, frequent ambulation for long distance travel and aggressive DVT prophylaxis in all surgical settings.  2. Inguinal adenopathy -Found on 02/26/17 CT Scan, but no palpable adenopathy found on PE. Most likely due to DVT in lower right leg. He has no other adenopathy on the scan and exam, lymphoma or other malignancy I much less likely. -Will repeat CT scan before next visit to monitor   3. Interstitial pneumonitis -We further reviewed his 02/26/17 CT scan and shows evidence of lesions in his lung as well as inflammation. I suggest if change occurs in repeat CT he  should see a Pulmonologist. I strongly encouraged him to stop smoking as it can negatively effects his lungs.  -Will repeat scan to monitor   4. Family counseling/Genetics for factor V Leyden mutation -Another main issue we previously discussed included the role of screening other family members for thrombophilia disorder. He does not have children. 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 I suggest he notifies his siblings and recommend them to be tested for factor V Leyden mutation. I discussed avoiding increased risks of blood clots if they are positive.   5. Left Leg swelling, secondary to blood clot -I suggest to help his swelling to elevate his feet and wear compression socks -I discussed this blood clot may not resolve due to his condition. I do not suggest a doppler/US at this time -We will continue to monitor.    PLAN:   -Continue Xarelto indefinitely  -F/u in 4 months with lab and CT CAP a few days before     Orders Placed This Encounter  Procedures  . CT Chest W Contrast    Standing Status:   Future    Standing Expiration Date:   03/10/2018    Order Specific Question:   If indicated for the ordered procedure, I authorize the administration of contrast media per Radiology protocol    Answer:   Yes    Order Specific Question:   Preferred imaging location?    Answer:   [facility]    Order Specific Question:   Radiology Contrast Protocol - do NOT remove file path    Answer:   _0 [email or web address]  . CT Abdomen Pelvis W Contrast    Standing Status:   Future    Standing Expiration Date:   03/10/2018    Order Specific Question:   If indicated for the ordered procedure, I authorize the administration of contrast media per Radiology protocol    Answer:   Yes    Order Specific Question:   Preferred imaging location?    Answer:   [facility]    Order Specific Question:   Radiology Contrast Protocol - do NOT remove  file path    Answer:   _1 [email or web address]    All questions were answered. The patient knows to call the clinic with any problems, questions or concerns. I spent 25 minutes counseling the patient face to face. The total time spent in the appointment was 30 minutes and more than 50% was on counseling.  This document serves as a record of services personally performed by Truitt Merle, MD. It was created on her behalf by Joslyn Devon,  a trained medical scribe. The creation of this record is based on the scribe's personal observations and the provider's statements to them. This document has been checked and approved by the attending provider.    Truitt Merle, MD 03/10/2017

## 2017-05-29 ENCOUNTER — Other Ambulatory Visit: Payer: Self-pay | Admitting: Hematology

## 2017-07-03 ENCOUNTER — Telehealth: Payer: Self-pay | Admitting: Hematology

## 2017-07-03 NOTE — Telephone Encounter (Signed)
Spoke to patient regarding upcoming January appointment updates  °

## 2017-07-06 ENCOUNTER — Ambulatory Visit (HOSPITAL_COMMUNITY)
Admission: RE | Admit: 2017-07-06 | Discharge: 2017-07-06 | Disposition: A | Discharge status: Home / Self Care | Payer: 59 | Source: Ambulatory Visit | Attending: Hematology | Admitting: Hematology

## 2017-07-06 ENCOUNTER — Telehealth: Payer: Self-pay | Admitting: *Deleted

## 2017-07-06 ENCOUNTER — Other Ambulatory Visit: Payer: Self-pay | Admitting: Hematology

## 2017-07-06 ENCOUNTER — Other Ambulatory Visit (HOSPITAL_BASED_OUTPATIENT_CLINIC_OR_DEPARTMENT_OTHER): Payer: 59

## 2017-07-06 DIAGNOSIS — I82501 Chronic embolism and thrombosis of unspecified deep veins of right lower extremity: Secondary | ICD-10-CM

## 2017-07-06 DIAGNOSIS — I82411 Acute embolism and thrombosis of right femoral vein: Secondary | ICD-10-CM

## 2017-07-06 LAB — CBC WITH DIFFERENTIAL/PLATELET
BASO%: 1.1 % (ref 0.0–2.0)
BASOS ABS: 0.1 10*3/uL (ref 0.0–0.1)
EOS%: 2.5 % (ref 0.0–7.0)
Eosinophils Absolute: 0.2 10*3/uL (ref 0.0–0.5)
HEMATOCRIT: 40.3 % (ref 38.4–49.9)
HGB: 14 g/dL (ref 13.0–17.1)
LYMPH#: 1.1 10*3/uL (ref 0.9–3.3)
LYMPH%: 15.3 % (ref 14.0–49.0)
MCH: 28.9 pg (ref 27.2–33.4)
MCHC: 34.8 g/dL (ref 32.0–36.0)
MCV: 83.1 fL (ref 79.3–98.0)
MONO#: 0.8 10*3/uL (ref 0.1–0.9)
MONO%: 11 % (ref 0.0–14.0)
NEUT#: 5.2 10*3/uL (ref 1.5–6.5)
NEUT%: 70.1 % (ref 39.0–75.0)
PLATELETS: 193 10*3/uL (ref 140–400)
RBC: 4.85 10*6/uL (ref 4.20–5.82)
RDW: 13.9 % (ref 11.0–14.6)
WBC: 7.5 10*3/uL (ref 4.0–10.3)

## 2017-07-06 LAB — COMPREHENSIVE METABOLIC PANEL
ALT: 18 U/L (ref 0–55)
AST: 14 U/L (ref 5–34)
Albumin: 3.7 g/dL (ref 3.5–5.0)
Alkaline Phosphatase: 75 U/L (ref 40–150)
Anion Gap: 8 mEq/L (ref 3–11)
BILIRUBIN TOTAL: 0.68 mg/dL (ref 0.20–1.20)
BUN: 15.1 mg/dL (ref 7.0–26.0)
CO2: 27 meq/L (ref 22–29)
Calcium: 9.7 mg/dL (ref 8.4–10.4)
Chloride: 105 mEq/L (ref 98–109)
Creatinine: 1 mg/dL (ref 0.7–1.3)
GLUCOSE: 87 mg/dL (ref 70–140)
POTASSIUM: 5.1 meq/L (ref 3.5–5.1)
SODIUM: 140 meq/L (ref 136–145)
TOTAL PROTEIN: 6.8 g/dL (ref 6.4–8.3)

## 2017-07-06 NOTE — Progress Notes (Signed)
Right lower extremity venous duplex has been completed. There is evidence of age indeterminate deep vein thrombosis involving the distal femoral, and popliteal veins of the right lower extremity. [facility] Latanya MaudlinFeng's office.  07/06/17 9:51 AM Olen CordialGreg Theran Vandergrift RVT

## 2017-07-06 NOTE — Telephone Encounter (Signed)
Received call from [facility]Greg reporting that pt US was positive for DVT RLE, age indeterminate but in same place as previous clot.  Reported to Dr Mosetta PuttFeng & pt is on xarelto.

## 2017-07-08 ENCOUNTER — Ambulatory Visit (HOSPITAL_COMMUNITY)
Admission: RE | Admit: 2017-07-08 | Discharge: 2017-07-08 | Disposition: A | Discharge status: Home / Self Care | Payer: 59 | Source: Ambulatory Visit | Attending: Hematology | Admitting: Hematology

## 2017-07-08 ENCOUNTER — Encounter (HOSPITAL_COMMUNITY): Payer: Self-pay

## 2017-07-08 DIAGNOSIS — R590 Localized enlarged lymph nodes: Secondary | ICD-10-CM | POA: Diagnosis not present

## 2017-07-08 DIAGNOSIS — J849 Interstitial pulmonary disease, unspecified: Secondary | ICD-10-CM | POA: Insufficient documentation

## 2017-07-08 DIAGNOSIS — M47816 Spondylosis without myelopathy or radiculopathy, lumbar region: Secondary | ICD-10-CM | POA: Insufficient documentation

## 2017-07-08 DIAGNOSIS — R591 Generalized enlarged lymph nodes: Secondary | ICD-10-CM | POA: Diagnosis present

## 2017-07-08 DIAGNOSIS — R918 Other nonspecific abnormal finding of lung field: Secondary | ICD-10-CM | POA: Insufficient documentation

## 2017-07-08 DIAGNOSIS — K7689 Other specified diseases of liver: Secondary | ICD-10-CM | POA: Insufficient documentation

## 2017-07-08 DIAGNOSIS — I700 Atherosclerosis of aorta: Secondary | ICD-10-CM | POA: Diagnosis not present

## 2017-07-08 DIAGNOSIS — N281 Cyst of kidney, acquired: Secondary | ICD-10-CM | POA: Insufficient documentation

## 2017-07-08 DIAGNOSIS — R599 Enlarged lymph nodes, unspecified: Secondary | ICD-10-CM

## 2017-07-08 MED ORDER — IOPAMIDOL (ISOVUE-300) INJECTION 61%
INTRAVENOUS | Status: AC
  Filled 2017-07-08: qty 100

## 2017-07-08 MED ORDER — IOPAMIDOL (ISOVUE-300) INJECTION 61%
100.0000 mL | Freq: Once | INTRAVENOUS | Status: AC | PRN
  Administered 2017-07-08: 100 mL via INTRAVENOUS

## 2017-07-09 ENCOUNTER — Ambulatory Visit: Payer: 59 | Admitting: Hematology

## 2017-07-23 ENCOUNTER — Ambulatory Visit (HOSPITAL_BASED_OUTPATIENT_CLINIC_OR_DEPARTMENT_OTHER): Payer: Managed Care, Other (non HMO) | Admitting: Hematology

## 2017-07-23 ENCOUNTER — Encounter: Payer: Self-pay | Admitting: Hematology

## 2017-07-23 VITALS — BP 131/92 | HR 90 | Temp 98.1°F | Resp 18 | Ht 72.0 in | Wt 303.6 lb

## 2017-07-23 DIAGNOSIS — M7989 Other specified soft tissue disorders: Secondary | ICD-10-CM

## 2017-07-23 DIAGNOSIS — I82431 Acute embolism and thrombosis of right popliteal vein: Secondary | ICD-10-CM

## 2017-07-23 DIAGNOSIS — I82890 Acute embolism and thrombosis of other specified veins: Secondary | ICD-10-CM | POA: Diagnosis not present

## 2017-07-23 DIAGNOSIS — J8489 Other specified interstitial pulmonary diseases: Secondary | ICD-10-CM | POA: Diagnosis not present

## 2017-07-23 DIAGNOSIS — I82441 Acute embolism and thrombosis of right tibial vein: Secondary | ICD-10-CM | POA: Diagnosis not present

## 2017-07-23 DIAGNOSIS — D6851 Activated protein C resistance: Secondary | ICD-10-CM | POA: Diagnosis not present

## 2017-07-23 DIAGNOSIS — R599 Enlarged lymph nodes, unspecified: Secondary | ICD-10-CM

## 2017-07-23 DIAGNOSIS — I82501 Chronic embolism and thrombosis of unspecified deep veins of right lower extremity: Secondary | ICD-10-CM

## 2017-07-23 DIAGNOSIS — J849 Interstitial pulmonary disease, unspecified: Secondary | ICD-10-CM | POA: Insufficient documentation

## 2017-07-23 DIAGNOSIS — I82411 Acute embolism and thrombosis of right femoral vein: Secondary | ICD-10-CM

## 2017-07-23 NOTE — Progress Notes (Signed)
[facility]  Telephone:([phone]) [phone] [phone]) [phone]  Clinic Follow up Note   Patient Care Team: Vernie Shanks, MD as PCP - General (Family Medicine) 07/23/2017  CHIEF COMPLAINTS:  F/u subacute DVT of right LE, factor V Leyden mutation positive  HISTORY OF PRESENTING ILLNESS: 02/02/17 Brent Holloway 62 y.o. male is here because of subacute DVT of right lower extremity. He was referred by his primary care physician .he presents to my clinic by himself today.   He woke up one morning with pain and swelling down his right leg in late April 2018. The pain stopped spontaneously, but is swollen persistent. He did not seek medical attention until 12/27/2016, when his wife made him go to the hospital due to a popped blood vessel and  complaints of unchanged right lower leg swelling for the past three  months. He had a vascular study done that confirmed a subacute DVT in the right mid to distal femoral, popliteal, posterior tibial and peroneal veins. He denies any leg pain, worsening swelling, chest pains, palpitations, sob, fever or warmth while at the ED.  Today, he presents to the clinic to discuss treatment. His right leg is still slightly red and swollen. He has compression bandage. Denied any SOB or pain. He is a Dealer and works on trucks. He admits to sitting more than he should. He does household activities like mowing the lawn. He has been smoking cigar for about 20 years, 1-2 daily, which he is trying to stop. He denies any night sweats or itchiness.  He denies recent chest pain on exertion, shortness of breath on minimal exertion, pre-syncopal episodes, hemoptysis, or palpitation. he denies recent history of trauma,  long distance travel, dehydration, recent surgery, prolonged immobilization. He smokes Cigars a few daily he had no prior history or diagnosis of cancer. He  he had prior surgeries before and never had perioperative thromboembolic events. The patient has  never been exposed to hormone replacement therapy, testosterone replacement therapy and never had thrombotic events. There is no family history of blood clots or miscarriages.   CURRENT THERAPY:  Xarelto 40m once dialy   INTERVAL HISTORY:  Brent Nesteris here for a follow up and review his repeated CT scan findings. He reports that he is doing well since we last saw him. He reports that he has been tolerating Xarelto well and he has not been having any issues with bruising/bleeding. He continues to have swelling in his right lower leg, but this has been improving. He denies any dyspnea on exertion, shortness of breath, or claudication.  MEDICAL HISTORY:  History reviewed. No pertinent past medical history.  SURGICAL HISTORY: Past Surgical History:  Procedure Laterality Date  . APPENDECTOMY  1976  . KNEE SURGERY Right     SOCIAL HISTORY: Social History   Socioeconomic History  . Marital status: Married    Spouse name: Not on file  . Number of children: Not on file  . Years of education: Not on file  . Highest education level: Not on file  Social Needs  . Financial resource strain: Not on file  . Food insecurity - worry: Not on file  . Food insecurity - inability: Not on file  . Transportation needs - medical: Not on file  . Transportation needs - non-medical: Not on file  Occupational History  . Not on file  Tobacco Use  . Smoking status: Current Every Day Smoker    Packs/day: 0.25    Years: 40.00  Pack years: 10.00    Types: Cigars  . Smokeless tobacco: Never Used  Substance and Sexual Activity  . Alcohol use: Yes    Comment: occ  . Drug use: No  . Sexual activity: Not on file  Other Topics Concern  . Not on file  Social History Narrative  . Not on file    FAMILY HISTORY: Family History  Problem Relation Age of Onset  . Heart disease Father     ALLERGIES:  has No Known Allergies.  MEDICATIONS:  Current Outpatient Medications  Medication Sig  Dispense Refill  . cetirizine (ZYRTEC) 10 MG tablet Take 10 mg by mouth daily.    . Omega-3 Fatty Acids (FISH OIL) 1000 MG CAPS Take 1 capsule by mouth daily.    Alveda Reasons 20 MG TABS tablet Take 20 mg by mouth daily.  2   No current facility-administered medications for this visit.     REVIEW OF SYSTEMS:   Constitutional: Denies fevers, chills or abnormal night sweats Eyes: Denies blurriness of vision, double vision or watery eyes Ears, nose, mouth, throat, and face: Denies mucositis or sore throat Respiratory: Denies dyspnea or wheezes  (+) coughing and runny nose mostly in mornings Cardiovascular: Denies palpitation, chest discomfort or lower extremity swelling Gastrointestinal:  Denies nausea, heartburn or change in bowel habits Skin: Denies abnormal skin rashes Lymphatics: Denies new lymphadenopathy or easy bruising Neurological:Denies numbness, tingling or new weaknesses Behavioral/Psych: Mood is stable, no new changes  MSK: (+)erythema and swelling of right leg All other systems were reviewed with the patient and are negative.  PHYSICAL EXAMINATION:  ECOG PERFORMANCE STATUS: 0 - Asymptomatic  Vitals:   07/23/17 1120  BP: (!) 131/92  Pulse: 90  Resp: 18  Temp: 98.1 F (36.7 C)  SpO2: 98%   Filed Weights   07/23/17 1120  Weight: (!) 303 lb 9.6 oz (137.7 kg)    GENERAL:alert, no distress and comfortable SKIN: skin color, texture, turgor are normal, no rashes or significant lesions (+) Diffuse mild skin erythema in the right lower extremity below knee. EYES: normal, conjunctiva are pink and non-injected, sclera clear OROPHARYNX:no exudate, no erythema and lips, buccal mucosa, and tongue normal  NECK: supple, thyroid normal size, non-tender, without nodularity LYMPH:  no palpable lymphadenopathy in the cervical, axillary or inguinal LUNGS: clear to auscultation and percussion with normal breathing effort HEART: regular rate & rhythm and no murmurs and no lower extremity  edema ABDOMEN:abdomen soft, non-tender and normal bowel sounds  Musculoskeletal:no cyanosis of digits and no clubbing (+) mild swelling of right leg up to knee. PSYCH: alert & oriented x 3 with fluent speech NEURO: no focal motor/sensory deficits  LABORATORY DATA:  I have reviewed the data as listed Recent Results (from the past 2160 hour(s))  Comprehensive metabolic panel     Status: None   Collection Time: 07/06/17  8:45 AM  Result Value Ref Range   Sodium 140 136 - 145 mEq/L   Potassium 5.1 3.5 - 5.1 mEq/L   Chloride 105 98 - 109 mEq/L   CO2 27 22 - 29 mEq/L   Glucose 87 70 - 140 mg/dl    Comment: Glucose reference range is for nonfasting patients. Fasting glucose reference range is 70- 100.   BUN 15.1 7.0 - 26.0 mg/dL   Creatinine 1.0 0.7 - 1.3 mg/dL   Total Bilirubin 0.68 0.20 - 1.20 mg/dL   Alkaline Phosphatase 75 40 - 150 U/L   AST 14 5 - 34 U/L  ALT 18 0 - 55 U/L   Total Protein 6.8 6.4 - 8.3 g/dL   Albumin 3.7 3.5 - 5.0 g/dL   Calcium 9.7 8.4 - 10.4 mg/dL   Anion Gap 8 3 - 11 mEq/L   EGFR >60 >60 ml/min/1.73 m2    Comment: eGFR is calculated using the CKD-EPI Creatinine Equation (2009)  CBC with Differential     Status: None   Collection Time: 07/06/17  8:45 AM  Result Value Ref Range   WBC 7.5 4.0 - 10.3 10e3/uL   NEUT# 5.2 1.5 - 6.5 10e3/uL   HGB 14.0 13.0 - 17.1 g/dL   HCT 40.3 38.4 - 49.9 %   Platelets 193 140 - 400 10e3/uL   MCV 83.1 79.3 - 98.0 fL   MCH 28.9 27.2 - 33.4 pg   MCHC 34.8 32.0 - 36.0 g/dL   RBC 4.85 4.20 - 5.82 10e6/uL   RDW 13.9 11.0 - 14.6 %   lymph# 1.1 0.9 - 3.3 10e3/uL   MONO# 0.8 0.1 - 0.9 10e3/uL   Eosinophils Absolute 0.2 0.0 - 0.5 10e3/uL   Basophils Absolute 0.1 0.0 - 0.1 10e3/uL   NEUT% 70.1 39.0 - 75.0 %   LYMPH% 15.3 14.0 - 49.0 %   MONO% 11.0 0.0 - 14.0 %   EOS% 2.5 0.0 - 7.0 %   BASO% 1.1 0.0 - 2.0 %   PATHOLOGY   Factor 5 Leiden 02/02/17 Order: [id number]  Status:  Final result  Visible to patient:  No (Not Released)   Next appt:  07/06/2017 at 09:00 AM in Vascular Surgery ([facility] US [facility])  Dx:  Leg DVT (deep venous thromboembolism)...  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  Ct Chest W Contrast Result Date: 07/08/2017  IMPRESSION: 1. Stable mildly enlarged right inguinal lymph node from baseline study of 4.5 months ago. This favors a benign etiology. No other enlarged lymph nodes. 2. Stable small pulmonary nodules bilaterally, also favoring a benign etiology. 3. Mildly progressive basilar predominant fibrotic interstitial lung disease, again favoring usual interstitial pneumonia or nonspecific interstitial pneumonia. Pulmonary consultation and follow-up high-resolution chest CT suggested. 4. Stable additional incidental findings, including hepatic and left renal cysts, mild aortic atherosclerosis and lumbar spondylosis. Electronically Signed   By: WRichardean SaleM.D.   On: 07/08/2017 13:31   Ct Abdomen Pelvis W Contrast Result Date: 07/08/2017  IMPRESSION: 1. Stable mildly enlarged right inguinal lymph node from baseline study of 4.5 months ago. This favors a benign etiology. No other enlarged lymph nodes. 2. Stable small pulmonary nodules bilaterally, also favoring a benign etiology. 3. Mildly progressive basilar predominant fibrotic interstitial lung disease, again favoring usual interstitial pneumonia or nonspecific interstitial pneumonia. Pulmonary consultation and follow-up high-resolution chest CT suggested. 4. Stable additional incidental findings, including hepatic and left renal cysts, mild aortic atherosclerosis and lumbar spondylosis. Electronically Signed   By: WRichardean SaleM.D.   On: 07/08/2017 13:31    ASSESSMENT:  62year old Caucasian male without past medical history, presented with  right leg pain and edema  1. Unprovoked right lower extremity DVT, subacute -I reviewed her lab tests results, which showed positive factor V Leyden mutation, heterozygous. This is a high risk for thrombosis, increased risk of thrombosis by 3-4 times. -I recommend continue Xarelto indefinitely   -  I previously recommended the patient to use elastic compression stockings at 20-30 mmHg to reduce risks of chronic thrombophlebitis. -I again reinforced the importance of preventive strategies such as avoiding hormonal supplement, avoiding cigarette smoking, keeping up-to-date with screening programs for early cancer detection, frequent ambulation for long distance travel and aggressive DVT prophylaxis in all surgical settings. -His right lower lower extremity swelling has improved, but not completely resolved.  2. Inguinal adenopathy -Found on 02/26/17 CT Scan, but no palpable adenopathy found on PE. Most likely due to DVT in lower right leg. He has no other adenopathy on the scan and exam, lymphoma or other malignancy I much less likely. -Repeat CT in 06/2017 showed this to be stable and not worsened from previous.  This is likely benign and reactive.  No further workup at this point.  3. Interstitial pneumonitis -We reviewed his 02/26/17 CT scan and shows evidence of lesions in his lung as well as inflammation. I strongly encouraged him to stop smoking as it can negatively effects his lungs.  -Given his recent repeat CT Chest in 06/2017 findings of persistent interstitial lung disease, I have recommended that he seek referral from his PCP for consultation with a Pulmonologist for further workup of this.   4. Family counseling/Genetics for factor V Leyden mutation -Another main issue we previously discussed included the role of screening other family members for thrombophilia disorder. He does not have children. 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 I suggest he notifies his  siblings and recommend them to be tested for factor V Leyden mutation. I discussed avoiding increased risks of blood clots if they are positive.   5. Left Leg swelling, secondary to blood clot -I suggest to help his swelling to elevate his feet and wear compression socks -I discussed this blood clot may not resolve due to his condition. I do not suggest a doppler/US at this time  PLAN:   -Continue Xarelto indefinitely  -F/u in clinic PRN -he will discuss with PCP Dr. Jacelyn Grip regarding pulmonary referral. I will copy Dr. Jacelyn Grip     No orders of the defined types were placed in this encounter.   All questions were answered. The patient knows to call the clinic with any problems, questions or concerns. I spent 25 minutes counseling the patient face to face. The total time spent in the appointment was 30 minutes and more than 50% was on counseling.  This document serves as a record of services personally performed by Truitt Merle, MD. It was created on her behalf by Reola Mosher, a trained medical scribe. The creation of this record is based on the scribe's personal observations and the provider's statements to them. This document has been checked and approved by the attending provider.  I have reviewed the above documentation for accuracy and completeness, and I agree with the above.    Truitt Merle, MD 07/23/2017

## 2017-08-05 ENCOUNTER — Ambulatory Visit: Payer: 59 | Admitting: Hematology

## 2017-08-05 ENCOUNTER — Other Ambulatory Visit: Payer: 59

## 2019-03-30 ENCOUNTER — Telehealth: Payer: Self-pay | Admitting: Pulmonary Disease

## 2019-03-30 ENCOUNTER — Other Ambulatory Visit: Payer: Self-pay

## 2019-03-30 ENCOUNTER — Ambulatory Visit (INDEPENDENT_AMBULATORY_CARE_PROVIDER_SITE_OTHER): Payer: Managed Care, Other (non HMO) | Admitting: Pulmonary Disease

## 2019-03-30 ENCOUNTER — Encounter: Payer: Self-pay | Admitting: Pulmonary Disease

## 2019-03-30 VITALS — BP 134/82 | HR 89 | Temp 98.0°F | Ht 71.0 in | Wt 274.0 lb

## 2019-03-30 DIAGNOSIS — J849 Interstitial pulmonary disease, unspecified: Secondary | ICD-10-CM | POA: Diagnosis not present

## 2019-03-30 DIAGNOSIS — R0602 Shortness of breath: Secondary | ICD-10-CM | POA: Diagnosis not present

## 2019-03-30 MED ORDER — ALBUTEROL SULFATE HFA 108 (90 BASE) MCG/ACT IN AERS: 2.0000 | INHALATION_SPRAY | Freq: Four times a day (QID) | RESPIRATORY_TRACT | 6 refills | Status: DC | PRN

## 2019-03-30 NOTE — Telephone Encounter (Signed)
Dr.  [name] would like PFT ASAP with follow with her

## 2019-03-30 NOTE — Patient Instructions (Addendum)
Shortness of breath Pulmonary function test ASAP START Albuterol inhaler for shortness of breath. You can take this inhaler before activity  Consider sleep study if PFTs return normal (borderline low oxygen status 89% on exertion)  Follow-up with me after PFTs

## 2019-03-30 NOTE — Progress Notes (Signed)
Subjective:   PATIENT ID: Brent Holloway GENDER: male DOB: 04/02/1956, MRN: [id number]   HPI  Chief Complaint  Patient presents with  . Consult    increased shortness of breath with activity 01/19/19 worse    Reason for Visit: New consult for shortness of breath  [name] is a 63 year old male former smoker (pipes and cigar) with heterozygous factor V Leyden mutation on Xarelto who presents with shortness of breath.  Reviewed PCP notes from Dr. Leodis SiasFrancis Wong on 03/08/19 and 03/15/19. Summarized as follows: He reports shortness of breath with exertion and worsened by hot weather and humidity. Has chest mucous which improves with steamy showers. CXR was negative.   For the last three months, he began having gradually worsening shortness of breath with exertion. Associated with productive cough. No wheezing. Every morning he lets his dogs out in the kennel and walk with them and noticed he was not able to keep up. He does not stop but feels his symptoms are significant. Worsens with heat and humidity. Improves with rest and air conditioning. 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 When he showers, he has chest congestion that improves productive cough and does not have issues after that.   He has gained 50lbs since the pandemic. He is restarting weight watchers which he previously did well with in the past. Reports snoring. Denies excessive daytime sleepiness, witnessed apnea or morning headaches. Energy and concentration is good.  Social History: Quit 2 years ago 4-5 cigars a day  Environmental exposures: Truck [employer]  I have personally reviewed patient's past medical/family/social history, allergies, current medications.  Past Medical History:  Diagnosis Date  . DVT (deep venous thrombosis) (HCC)      Family History  Problem Relation Age of Onset  . Heart disease Father   . Congestive Heart Failure Father   . Dementia Mother      Social History    Occupational History  . Not on file  Tobacco Use  . Smoking status: Former Smoker    Packs/day: 0.25    Years: 44.00    Pack years: 11.00    Types: Cigars    Start date: 341974    Quit date: 03/29/2017    Years since quitting: 2.0  . Smokeless tobacco: Never Used  . Tobacco comment: 5-6 cigars daily   Substance and Sexual Activity  . Alcohol use: Yes    Comment: occ  . Drug use: No  . Sexual activity: Not on file    No Known Allergies   Outpatient Medications Prior to Visit  Medication Sig Dispense Refill  . cetirizine (ZYRTEC) 10 MG tablet Take 10 mg by mouth daily.    . Omega-3 Fatty Acids (FISH OIL) 1000 MG CAPS Take 1 capsule by mouth daily.    Carlena Hurl. XARELTO 20 MG TABS tablet Take 20 mg by mouth daily.  2   No facility-administered medications prior to visit.     Review of Systems  Constitutional: Negative for chills, diaphoresis, fever, malaise/fatigue and weight loss.  HENT: Negative for congestion, ear pain and sore throat.   Respiratory: Positive for cough, sputum production and shortness of breath. Negative for hemoptysis and wheezing.   Cardiovascular: Negative for chest pain, palpitations and leg swelling.  Gastrointestinal: Negative for abdominal pain, heartburn and nausea.  Genitourinary: Positive for frequency.  Musculoskeletal: Negative for joint pain and myalgias.  Skin: Negative for itching and rash.  Neurological: Negative for dizziness, weakness and headaches.  Endo/Heme/Allergies:  Does not bruise/bleed easily.  Psychiatric/Behavioral: Negative for depression. The patient is not nervous/anxious.      Objective:   Vitals:   03/30/19 1020 03/30/19 1022  BP:  134/82  Pulse:  89  Temp: 98 F (36.7 C)   TempSrc: Temporal   SpO2:  98%  Weight: 274 lb (124.3 kg)   Height: 5\' 11"  (1.803 m)    SpO2: 98 % O2 Device: None (Room air)  Physical Exam: General: BMI 38.22. Well-appearing, no acute distress HENT: [place], AT Eyes: EOMI, no scleral icterus  Respiratory: Clear to auscultation bilaterally.  No crackles, wheezing or rales Cardiovascular: RRR, -M/R/G, no JVD GI: BS+, soft, nontender Extremities:Mild RLE edema, non-pitting,-tenderness Neuro: AAO x4, CNII-XII grossly intact Skin: RLE chronic venous stasis changes Psych: Normal mood, normal affect  Data Reviewed:  Imaging: CT chest 07/08/2017 - Upper lobe predominant L>R subpleural reticulation with cystic-appearing lesions that may represent early honeycombing  PFT: None  Labs: CBC 03/15/19 WBC 6.9 with N 70% (4800) L 15% (1100) M 11% (800) E 2.3% (200) Hg 14.9 Plt 232  BMP 03/15/19 CO2 31, BUN/Cr 12/0.9  Imaging, labs and tests noted above have been reviewed independently by me.    Assessment & Plan:   Discussion: 63 year old male with hx RLE DVT secondary to heterozygous Factor V Leiden who presents with shortness of breath with exertion x 3 months. Differential is broad however due to his prior smoking hx (cigars) and work as a Dealer, will need to rule out airway disease. Of note, he has previously had evidence of fibrotic changes in the upper lobes of his lungs that are suggestive of interstitial lung process. On his walk into clinic, he also had low-normal oxygen saturations with exertion (nadir 89%). We discussed work-up for sleep apnea as would be intermediate risk based on BMI, dyspnea and recent serum CO2 suggestive of compensatory metabolic alkalosis however patient preferred to wait on additional work-up before pursuing.  Shortness of breath Pulmonary function test ASAP START Albuterol inhaler for shortness of breath. You can take this inhaler before activity  Consider sleep study if PFTs return normal (borderline low oxygen status 89% on exertion)  Fibrotic lung disease seen on CT Consider further evaluation   Health Maintenance Immunization History  Administered Date(s) Administered  . Influenza Inj Mdck Quad Pf 03/23/2019   CT Lung Screen - not  qualified  Orders Placed This Encounter  Procedures  . Pulmonary function test    Standing Status:   Future    Standing Expiration Date:   03/29/2020    Scheduling Instructions:     ASAP    Order Specific Question:   Where should this test be performed?    Answer:   [facility]    Order Specific Question:   Full PFT: includes the following: basic spirometry, spirometry pre & post bronchodilator, diffusion capacity (DLCO), lung volumes    Answer:   Full PFT   Meds ordered this encounter  Medications  . albuterol (VENTOLIN HFA) 108 (90 Base) MCG/ACT inhaler    Sig: Inhale 2 puffs into the lungs every 6 (six) hours as needed for wheezing or shortness of breath.    Dispense:  6.7 g    Refill:  6    Return After PFTs.  Turner, MD [facility] 03/30/2019 10:58 AM  Office Number [phone]

## 2019-03-31 ENCOUNTER — Encounter: Payer: Self-pay | Admitting: Cardiology

## 2019-03-31 ENCOUNTER — Ambulatory Visit (INDEPENDENT_AMBULATORY_CARE_PROVIDER_SITE_OTHER): Payer: Managed Care, Other (non HMO) | Admitting: Cardiology

## 2019-03-31 ENCOUNTER — Encounter: Payer: Self-pay | Admitting: Pulmonary Disease

## 2019-03-31 ENCOUNTER — Other Ambulatory Visit: Payer: Self-pay

## 2019-03-31 VITALS — BP 122/90 | HR 90 | Temp 96.8°F | Ht 71.0 in | Wt 268.0 lb

## 2019-03-31 DIAGNOSIS — R0600 Dyspnea, unspecified: Secondary | ICD-10-CM

## 2019-03-31 NOTE — Patient Instructions (Signed)
Medication Instructions:  Your physician recommends that you continue on your current medications as directed. Please refer to the Current Medication list given to you today.  If you need a refill on your cardiac medications before your next appointment, please call your pharmacy.   Lab work:None If you have labs (blood work) drawn today and your tests are completely normal, you will receive your results only by: . MyChart Message (if you have MyChart) OR . A paper copy in the mail If you have any lab test that is abnormal or we need to change your treatment, we will call you to review the results.  Testing/Procedures: Your physician has requested that you have an echocardiogram. Echocardiography is a painless test that uses sound waves to create images of your heart. It provides your doctor with information about the size and shape of your heart and how well your heart's chambers and valves are working. This procedure takes approximately one hour. There are no restrictions for this procedure.    Follow-Up: At [facility], you and your health needs are our priority.  As part of our continuing mission to provide you with exceptional heart care, we have created designated Provider Care Teams.  These Care Teams include your primary Cardiologist (physician) and Advanced Practice Providers (APPs -  Physician Assistants and Nurse Practitioners) who all work together to provide you with the care you need, when you need it. You will need a follow up appointment in 2 months with Dr Tobb. Any Other Special Instructions Will Be Listed Below (If Applicable).    Echocardiogram An echocardiogram is a procedure that uses painless sound waves (ultrasound) to produce an image of the heart. Images from an echocardiogram can provide important information about:  Signs of coronary artery disease (CAD).  Aneurysm detection. An aneurysm is a weak or damaged part of an artery wall that bulges out from the  normal force of blood pumping through the body.  Heart size and shape. Changes in the size or shape of the heart can be associated with certain conditions, including heart failure, aneurysm, and CAD.  Heart muscle function.  Heart valve function.  Signs of a past heart attack.  Fluid buildup around the heart.  Thickening of the heart muscle.  A tumor or infectious growth around the heart valves. Tell a health care provider about:  Any allergies you have.  All medicines you are taking, including vitamins, herbs, eye drops, creams, and over-the-counter medicines.  Any blood disorders you have.  Any surgeries you have had.  Any medical conditions you have.  Whether you are pregnant or may be pregnant. What are the risks? Generally, this is a safe procedure. However, problems may occur, including:  Allergic reaction to dye (contrast) that may be used during the procedure. What happens before the procedure? No specific preparation is needed. You may eat and drink normally. What happens during the procedure?   An IV tube may be inserted into one of your veins.  You may receive contrast through this tube. A contrast is an injection that improves the quality of the pictures from your heart.  A gel will be applied to your chest.  A wand-like tool (transducer) will be moved over your chest. The gel will help to transmit the sound waves from the transducer.  The sound waves will harmlessly bounce off of your heart to allow the heart images to be captured in real-time motion. The images will be recorded on a computer. The procedure may   vary among health care providers and hospitals. What happens after the procedure?  You may return to your normal, everyday life, including diet, activities, and medicines, unless your health care provider tells you not to do that. Summary  An echocardiogram is a procedure that uses painless sound waves (ultrasound) to produce an image of the  heart.  Images from an echocardiogram can provide important information about the size and shape of your heart, heart muscle function, heart valve function, and fluid buildup around your heart.  You do not need to do anything to prepare before this procedure. You may eat and drink normally.  After the echocardiogram is completed, you may return to your normal, everyday life, unless your health care provider tells you not to do that. This information is not intended to replace advice given to you by your health care provider. Make sure you discuss any questions you have with your health care provider. Document Released: 07/04/2000 Document Revised: 10/28/2018 Document Reviewed: 08/09/2016 Elsevier Patient Education  2020 Elsevier Inc.    

## 2019-03-31 NOTE — Progress Notes (Signed)
Cardiology Office Note:    Date:  03/31/2019   ID:  Brent Holloway, DOB 01/20/1956, MRN [id number]  PCP:  Brent LaddWong, Francis P, MD  Cardiologist:  No primary care provider on file.  Electrophysiologist:  None   Referring MD: Brent LaddWong, Francis P, MD   Chief Complaint  Patient presents with  . Shortness of Breath    History of Present Illness:    Brent Holloway is a 63 y.o. male with a hx of factor V Leiden mutation with previous DVT currently on Xarelto, recent CT scan with characteristics suggestive of interstitial lung disease, former smoker presents to be evaluated for shortness of breath.  The patient reports that since July 2020  he started experience gradual onset of shortness of breath with exertion.  He states that it would initially begin when he did excessive work around the house where he will be limited by shortness of breath.  He notes at that time he was able to let his dog out walked him without any problems.  But recently he tells me that even walking the dogs is now a significant problem with the shortness of breath.  It is getting worse.  He reports that on humid days is worse than other days.  He tells me that his shortness of breath improved with rest and also much better when he sits either in his truck or in the house and put on the air conditioning. He denies any chest pain, palpitations, nausea, vomiting.  Past Medical History:  Diagnosis Date  . DVT (deep venous thrombosis) (HCC)   . Factor 5 Leiden mutation, heterozygous [name]     Past Surgical History:  Procedure Laterality Date  . APPENDECTOMY  1976  . KNEE SURGERY Right     Current Medications: Current Meds  Medication Sig  . albuterol (VENTOLIN HFA) 108 (90 Base) MCG/ACT inhaler Inhale 2 puffs into the lungs every 6 (six) hours as needed for wheezing or shortness of breath.  . cetirizine (ZYRTEC) 10 MG tablet Take 10 mg by mouth daily.  . Omega-3 Fatty Acids (FISH OIL) 1000 MG CAPS Take 1 capsule by mouth daily.   Carlena Hurl. XARELTO 20 MG TABS tablet Take 20 mg by mouth daily.     Allergies:   Patient has no known allergies.   Social History   Socioeconomic History  . Marital status: Married    Spouse name: Not on file  . Number of children: Not on file  . Years of education: Not on file  . Highest education level: Not on file  Occupational History  . Not on file  Social Needs  . Financial resource strain: Not on file  . Food insecurity    Worry: Not on file    Inability: Not on file  . Transportation needs    Medical: Not on file    Non-medical: Not on file  Tobacco Use  . Smoking status: Former Smoker    Packs/day: 0.25    Years: 44.00    Pack years: 11.00    Types: Cigars    Start date: 341974    Quit date: 03/29/2017    Years since quitting: 2.0  . Smokeless tobacco: Never Used  . Tobacco comment: 5-6 cigars daily   Substance and Sexual Activity  . Alcohol use: Yes    Comment: occ  . Drug use: No  . Sexual activity: Not on file  Lifestyle  . Physical activity    Days per week: Not on file  Minutes per session: Not on file  . Stress: Not on file  Relationships  . Social Musician on phone: Not on file    Gets together: Not on file    Attends religious service: Not on file    Active member of club or organization: Not on file    Attends meetings of clubs or organizations: Not on file    Relationship status: Not on file  Other Topics Concern  . Not on file  Social History Narrative  . Not on file     Family History: The patient's family history includes Congestive Heart Failure in his father; Dementia in his mother; Heart disease in his father.  ROS:   Please see the history of present illness.   Review of Systems  Constitution: Negative for decreased appetite, malaise/fatigue and weight gain.  HENT: Negative for ear pain, nosebleeds and sore throat.   Eyes: Negative for blurred vision, pain and vision loss in left eye.  Cardiovascular: Positive for dyspnea  on exertion. Negative for chest pain, near-syncope and syncope.  Respiratory: Negative for cough and sputum production.   Endocrine: Negative for heat intolerance.  Hematologic/Lymphatic: Negative for bleeding problem.  Skin: Negative for nail changes, skin cancer and suspicious lesions.  Musculoskeletal: Negative for arthritis, joint pain and muscle weakness.  Gastrointestinal: Negative for bloating, change in bowel habit, constipation, diarrhea, hematemesis and melena.  Genitourinary: Negative for flank pain, hesitancy and urgency.  Neurological: Negative for dizziness, headaches, light-headedness, seizures and tremors.  Psychiatric/Behavioral: Negative for depression. The patient does not have insomnia and is not nervous/anxious.   Allergic/Immunologic: Negative for HIV exposure and persistent infections.      EKGs/Labs/Other Studies Reviewed:    The following studies were reviewed today:  EKG:  The ekg ordered today demonstrates sinus rhythm, heart rate 82 bpm right axis deviation  Recent Labs: Chemistry: Creatinine 0.9, potassium 4.8 ALT 14 BUN 12 Recent Lipid Panel   Lipid panel performed March 15, 2019 report total cholesterol 144, HDL 46, LDL 86, triglycerides 61  Physical Exam:    VS:  BP 122/90 (BP Location: Right Arm, Patient Position: Sitting, Cuff Size: Normal)   Pulse 90   Temp (!) 96.8 F (36 C)   Ht 5\' 11"  (1.803 m)   Wt 268 lb (121.6 kg)   SpO2 96%   BMI 37.38 kg/m     Wt Readings from Last 3 Encounters:  03/31/19 268 lb (121.6 kg)  03/30/19 274 lb (124.3 kg)  07/23/17 (!) 303 lb 9.6 oz (137.7 kg)     GEN: Well nourished, well developed in no acute distress HEENT: Normal NECK: No JVD; No carotid bruits LYMPHATICS: No lymphadenopathy CARDIAC: S1,S2 noted, RRR, no murmurs, rubs, gallops RESPIRATORY:  Clear to auscultation without rales, wheezing or rhonchi  ABDOMEN: Soft, non-tender, non-distended EXTREMITIES: Right leg with evidence of varicose  veins and chronic venous stasis, left leg no edema.  Bilateral legs no cyanosis no clubbing MUSCULOSKELETAL:  No edema; No deformity  SKIN: Warm and dry NEUROLOGIC:  Alert and oriented x 3, nonfocal PSYCHIATRIC:  Normal affect, good insight  ASSESSMENT:    1. Dyspnea, unspecified type   2. Morbid obesity (HCC)    PLAN:    At time a transthoracic echocardiogram will be performed to assess right ventricular and left ventricular function, and structural abnormalities.  I do believe that his shortness of breath could be predominantly from his lungs given his abnormal CAT scan suggestive of interstitial lung disease  as well as possible COPD (significant history of smoking).  He was started on nebulizer treatment by his primary care physician yesterday.  I discussed with patient that we will reassess the need for any further cardiovascular work-up at at our follow-up visit which will be in 2 months.  We advised to continue take his inhalers and exert himself as can tolerate.  He was also advised to go to the nearest ED if this gets worse and if chest pain occurs.  Obesity-lifestyle modification advised.  He may also benefit from a sleep study.  The patient is in agreement with the above plan.  He will follow-up in 2 months.  All of his questions were answered.   Medication Adjustments/Labs and Tests Ordered: Current medicines are reviewed at length with the patient today.  Concerns regarding medicines are outlined above.  Orders Placed This Encounter  Procedures  . EKG 12-Lead  . ECHOCARDIOGRAM COMPLETE   No orders of the defined types were placed in this encounter.   Patient Instructions  Medication Instructions:  Your physician recommends that you continue on your current medications as directed. Please refer to the Current Medication list given to you today.  If you need a refill on your cardiac medications before your next appointment, please call your pharmacy.   Lab work: None  If you have labs (blood work) drawn today and your tests are completely normal, you will receive your results only by: Marland Kitchen MyChart Message (if you have MyChart) OR . A paper copy in the mail If you have any lab test that is abnormal or we need to change your treatment, we will call you to review the results.  Testing/Procedures: Your physician has requested that you have an echocardiogram. Echocardiography is a painless test that uses sound waves to create images of your heart. It provides your doctor with information about the size and shape of your heart and how well your heart's chambers and valves are working. This procedure takes approximately one hour. There are no restrictions for this procedure.    Follow-Up: At [facility], you and your health needs are our priority.  As part of our continuing mission to provide you with exceptional heart care, we have created designated Provider Care Teams.  These Care Teams include your primary Cardiologist (physician) and Advanced Practice Providers (APPs -  Physician Assistants and Nurse Practitioners) who all work together to provide you with the care you need, when you need it. You will need a follow up appointment in 2 months with Dr Harriet Masson. Any Other Special Instructions Will Be Listed Below (If Applicable).  Echocardiogram An echocardiogram is a procedure that uses painless sound waves (ultrasound) to produce an image of the heart. Images from an echocardiogram can provide important information about:  Signs of coronary artery disease (CAD).  Aneurysm detection. An aneurysm is a weak or damaged part of an artery wall that bulges out from the normal force of blood pumping through the body.  Heart size and shape. Changes in the size or shape of the heart can be associated with certain conditions, including heart failure, aneurysm, and CAD.  Heart muscle function.  Heart valve function.  Signs of a past heart attack.  Fluid buildup around  the heart.  Thickening of the heart muscle.  A tumor or infectious growth around the heart valves. Tell a health care provider about:  Any allergies you have.  All medicines you are taking, including vitamins, herbs, eye drops, creams, and over-the-counter medicines.  Any blood disorders you have.  Any surgeries you have had.  Any medical conditions you have.  Whether you are pregnant or may be pregnant. What are the risks? Generally, this is a safe procedure. However, problems may occur, including:  Allergic reaction to dye (contrast) that may be used during the procedure. What happens before the procedure? No specific preparation is needed. You may eat and drink normally. What happens during the procedure?   An IV tube may be inserted into one of your veins.  You may receive contrast through this tube. A contrast is an injection that improves the quality of the pictures from your heart.  A gel will be applied to your chest.  A wand-like tool (transducer) will be moved over your chest. The gel will help to transmit the sound waves from the transducer.  The sound waves will harmlessly bounce off of your heart to allow the heart images to be captured in real-time motion. The images will be recorded on a computer. The procedure may vary among health care providers and hospitals. What happens after the procedure?  You may return to your normal, everyday life, including diet, activities, and medicines, unless your health care provider tells you not to do that. Summary  An echocardiogram is a procedure that uses painless sound waves (ultrasound) to produce an image of the heart.  Images from an echocardiogram can provide important information about the size and shape of your heart, heart muscle function, heart valve function, and fluid buildup around your heart.  You do not need to do anything to prepare before this procedure. You may eat and drink normally.  After the  echocardiogram is completed, you may return to your normal, everyday life, unless your health care provider tells you not to do that. This information is not intended to replace advice given to you by your health care provider. Make sure you discuss any questions you have with your health care provider. Document Released: 07/04/2000 Document Revised: 10/28/2018 Document Reviewed: 08/09/2016 Elsevier Patient Education  [address]2020 Elsevier Inc.      Signed, Thomasene RippleKardie Gradie Butrick, DO  03/31/2019 10:13 AM    [facility]

## 2019-04-01 NOTE — Telephone Encounter (Signed)
L/m on pt vm to call back to schedule pft -pr  °

## 2019-04-04 ENCOUNTER — Other Ambulatory Visit: Payer: Self-pay

## 2019-04-04 ENCOUNTER — Ambulatory Visit (HOSPITAL_BASED_OUTPATIENT_CLINIC_OR_DEPARTMENT_OTHER)
Admission: RE | Admit: 2019-04-04 | Discharge: 2019-04-04 | Disposition: A | Discharge status: Home / Self Care | Payer: Managed Care, Other (non HMO) | Source: Ambulatory Visit | Attending: Cardiology | Admitting: Cardiology

## 2019-04-04 DIAGNOSIS — R0600 Dyspnea, unspecified: Secondary | ICD-10-CM

## 2019-04-04 NOTE — Progress Notes (Signed)
  Echocardiogram 2D Echocardiogram has been performed.  Cardell Peach 04/04/2019, 10:53 AM

## 2019-04-06 ENCOUNTER — Other Ambulatory Visit: Payer: Self-pay | Admitting: Pulmonary Disease

## 2019-04-06 NOTE — Telephone Encounter (Signed)
Scheduled pft on 04/15/2019-pr  °

## 2019-04-08 ENCOUNTER — Telehealth: Payer: Self-pay | Admitting: *Deleted

## 2019-04-08 DIAGNOSIS — I779 Disorder of arteries and arterioles, unspecified: Secondary | ICD-10-CM

## 2019-04-08 NOTE — Telephone Encounter (Signed)
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 At this point we will get a CT scan chest no contrast to double check this. Please ordered a CT scan of the chest noncontrast. He will follow up as planned 

## 2019-04-12 ENCOUNTER — Other Ambulatory Visit (HOSPITAL_COMMUNITY)
Admission: RE | Admit: 2019-04-12 | Discharge: 2019-04-12 | Disposition: A | Discharge status: Home / Self Care | Payer: Managed Care, Other (non HMO) | Source: Ambulatory Visit | Attending: Pulmonary Disease | Admitting: Pulmonary Disease

## 2019-04-12 ENCOUNTER — Telehealth: Payer: Self-pay | Admitting: *Deleted

## 2019-04-12 DIAGNOSIS — Z01812 Encounter for preprocedural laboratory examination: Secondary | ICD-10-CM | POA: Insufficient documentation

## 2019-04-12 DIAGNOSIS — Z20828 Contact with and (suspected) exposure to other viral communicable diseases: Secondary | ICD-10-CM | POA: Insufficient documentation

## 2019-04-12 NOTE — Telephone Encounter (Signed)
Telephone call to patient. Informed him that insurance has denied our request for CT chest w/o contrast for aortic root dilitation. An appeal has been made and will contact him if successful otherwise Dr Harriet Masson said we would repeat echo in 6 months. Pt agreeable to plan.

## 2019-04-12 NOTE — Telephone Encounter (Signed)
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 At this point we will get a CT scan chest no contrast to double check this. Please ordered a CT scan of the chest noncontrast. He will follow up as planned

## 2019-04-13 LAB — NOVEL CORONAVIRUS, NAA (HOSP ORDER, SEND-OUT TO REF LAB; TAT 18-24 HRS): SARS-CoV-2, NAA: NOT DETECTED

## 2019-04-15 ENCOUNTER — Encounter: Payer: Self-pay | Admitting: Primary Care

## 2019-04-15 ENCOUNTER — Other Ambulatory Visit: Payer: Self-pay

## 2019-04-15 ENCOUNTER — Ambulatory Visit: Payer: Managed Care, Other (non HMO) | Admitting: Primary Care

## 2019-04-15 ENCOUNTER — Ambulatory Visit (INDEPENDENT_AMBULATORY_CARE_PROVIDER_SITE_OTHER): Payer: Managed Care, Other (non HMO) | Admitting: Pulmonary Disease

## 2019-04-15 VITALS — BP 124/80 | HR 88 | Temp 97.5°F | Ht 71.0 in | Wt 269.0 lb

## 2019-04-15 DIAGNOSIS — J849 Interstitial pulmonary disease, unspecified: Secondary | ICD-10-CM

## 2019-04-15 DIAGNOSIS — R0683 Snoring: Secondary | ICD-10-CM

## 2019-04-15 DIAGNOSIS — R0609 Other forms of dyspnea: Secondary | ICD-10-CM

## 2019-04-15 DIAGNOSIS — R0602 Shortness of breath: Secondary | ICD-10-CM | POA: Diagnosis not present

## 2019-04-15 LAB — PULMONARY FUNCTION TEST
DL/VA % pred: 78 %
DL/VA: 3.24 ml/min/mmHg/L
DLCO unc % pred: 50 %
DLCO unc: 14.23 ml/min/mmHg
FEF 25-75 Post: 3.82 L/sec
FEF 25-75 Pre: 3.56 L/sec
FEF2575-%Change-Post: 7 %
FEF2575-%Pred-Post: 130 %
FEF2575-%Pred-Pre: 121 %
FEV1-%Change-Post: 1 %
FEV1-%Pred-Post: 80 %
FEV1-%Pred-Pre: 79 %
FEV1-Post: 2.93 L
FEV1-Pre: 2.88 L
FEV1FVC-%Change-Post: 2 %
FEV1FVC-%Pred-Pre: 116 %
FEV6-%Change-Post: 0 %
FEV6-%Pred-Post: 71 %
FEV6-%Pred-Pre: 71 %
FEV6-Post: 3.29 L
FEV6-Pre: 3.3 L
FEV6FVC-%Pred-Post: 105 %
FEV6FVC-%Pred-Pre: 105 %
FVC-%Change-Post: 0 %
FVC-%Pred-Post: 67 %
FVC-%Pred-Pre: 68 %
FVC-Post: 3.29 L
FVC-Pre: 3.3 L
Post FEV1/FVC ratio: 89 %
Post FEV6/FVC ratio: 100 %
Pre FEV1/FVC ratio: 87 %
Pre FEV6/FVC Ratio: 100 %
RV % pred: 50 %
RV: 1.18 L
TLC % pred: 61 %
TLC: 4.43 L

## 2019-04-15 NOTE — Assessment & Plan Note (Addendum)
-   PFTs today showed no evidence of obstruction, mild-moderate restriction with decreased diffusion capacity - Clinical symptoms not consistent with asthma, most likely d/t reported weight gain and physical deconditioning-  concerned about potential for interstitial lung disease which needs further imaging to rule out as potential cause of dyspnea - Continue Albuterol hfa 2 puffs every 4-6 hours as needed  - Ordering HRCT (hx fibrotic changes on CT)

## 2019-04-15 NOTE — Progress Notes (Signed)
PFT done today. 

## 2019-04-15 NOTE — Assessment & Plan Note (Addendum)
-   Reported loud snoring  - Moderate risk for sleep apnea d/t BMI 37 - Epworth 6 - Ordering home sleep test

## 2019-04-15 NOTE — Patient Instructions (Addendum)
Continue attending weight watchers Work on weight loss and increasing physical activity  Orders: Hight resolution CT Chest re: ILD Home Sleep study re: snoring   Follow-up: 4-6 week follow-up with Dr. Everardo Brent Holloway     Exercising to Lose Weight Exercise is structured, repetitive physical activity to improve fitness and health. Getting regular exercise is important for everyone. It is especially important if you are overweight. Being overweight increases your risk of heart disease, stroke, diabetes, high blood pressure, and several types of cancer. Reducing your calorie intake and exercising can help you lose weight. Exercise is usually categorized as moderate or vigorous intensity. To lose weight, most people need to do a certain amount of moderate-intensity or vigorous-intensity exercise each week. Moderate-intensity exercise  Moderate-intensity exercise is any activity that gets you moving enough to burn at least three times more energy (calories) than if you were sitting. Examples of moderate exercise include:  Walking a mile in 15 minutes.  Doing light yard work.  Biking at an easy pace. Most people should get at least 150 minutes (2 hours and 30 minutes) a week of moderate-intensity exercise to maintain their body weight. Vigorous-intensity exercise Vigorous-intensity exercise is any activity that gets you moving enough to burn at least six times more calories than if you were sitting. When you exercise at this intensity, you should be working hard enough that you are not able to carry on a conversation. Examples of vigorous exercise include:  Running.  Playing a team sport, such as football, basketball, and soccer.  Jumping rope. Most people should get at least 75 minutes (1 hour and 15 minutes) a week of vigorous-intensity exercise to maintain their body weight. How can exercise affect me? When you exercise enough to burn more calories than you eat, you lose weight. Exercise  also reduces body fat and builds muscle. The more muscle you have, the more calories you burn. Exercise also:  Improves mood.  Reduces stress and tension.  Improves your overall fitness, flexibility, and endurance.  Increases bone strength. The amount of exercise you need to lose weight depends on:  Your age.  The type of exercise.  Any health conditions you have.  Your overall physical ability. Talk to your health care provider about how much exercise you need and what types of activities are safe for you. What actions can I take to lose weight? Nutrition   Make changes to your diet as told by your health care provider or diet and nutrition specialist (dietitian). This may include: ? Eating fewer calories. ? Eating more protein. ? Eating less unhealthy fats. ? Eating a diet that includes fresh fruits and vegetables, whole grains, low-fat dairy products, and lean protein. ? Avoiding foods with added fat, salt, and sugar.  Drink plenty of water while you exercise to prevent dehydration or heat stroke. Activity  Choose an activity that you enjoy and set realistic goals. Your health care provider can help you make an exercise plan that works for you.  Exercise at a moderate or vigorous intensity most days of the week. ? The intensity of exercise may vary from person to person. You can tell how intense a workout is for you by paying attention to your breathing and heartbeat. Most people will notice their breathing and heartbeat get faster with more intense exercise.  Do resistance training twice each week, such as: ? Push-ups. ? Sit-ups. ? Lifting weights. ? Using resistance bands.  Getting short amounts of exercise can be just as helpful as  long structured periods of exercise. If you have trouble finding time to exercise, try to include exercise in your daily routine. ? Get up, stretch, and walk around every 30 minutes throughout the day. ? Go for a walk during your lunch  break. ? Park your car farther away from your destination. ? If you take public transportation, get off one stop early and walk the rest of the way. ? Make phone calls while standing up and walking around. ? Take the stairs instead of elevators or escalators.  Wear comfortable clothes and shoes with good support.  Do not exercise so much that you hurt yourself, feel dizzy, or get very short of breath. Where to find more information  [place] Department of Health and Human Services: [employer]at  Centers for Disease Control and Prevention (CDC): http://www.wolf.info/ Contact a health care provider:  Before starting a new exercise program.  If you have questions or concerns about your weight.  If you have a medical problem that keeps you from exercising. Get help right away if you have any of the following while exercising:  Injury.  Dizziness.  Difficulty breathing or shortness of breath that does not go away when you stop exercising.  Chest pain.  Rapid heartbeat. Summary  Being overweight increases your risk of heart disease, stroke, diabetes, high blood pressure, and several types of cancer.  Losing weight happens when you burn more calories than you eat.  Reducing the amount of calories you eat in addition to getting regular moderate or vigorous exercise each week helps you lose weight. This information is not intended to replace advice given to you by your health care provider. Make sure you discuss any questions you have with your health care provider. Document Released: 08/09/2010 Document Revised: 07/20/2017 Document Reviewed: 07/20/2017 Elsevier Patient Education  2020 [employer].

## 2019-04-15 NOTE — Progress Notes (Signed)
@Patient  ID: Brent Holloway, male    DOB: 1955/10/31, 63 y.o.   MRN: [id number]  Chief Complaint  Patient presents with  . Follow-up    Patient reports that he still gets sob with exertion. He reports using his rescue inhaler 4 times daily    Referring provider: Vernie Shanks, MD  HPI: 63 year old male, former smoker quit in 2018 (11 cigar pack year hx). 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 Patient of Dr. [name], seen for initial consult on 03/30/19 for shortness of breath on exertion x 3 months with associated productive cough. Significant weight gain of 50lbs since pandemic. Intermediate risk for sleep apnea based on BMI. Borderline low oxygen saturation 89% on exertion. Fibrotic lung disease seen on CT, consider further evaluation. Ordered for PFTs.   04/15/2019 Patient presents today for 2 week follow-up with PFTs. He still reports shortness of breath on exertion, less since weather has cooled. Reports occasional dry cough, worse with talking.  Some am mucus production. He does not experience any wheezing. No improvement from Albuterol hfa. Former smoker, quit in 2018. He smoked 3-4 cigars a day for 40 years. Reports loud snoring when he lies on his back, normally sleeps on his side. States that he lost 75 lbs with weight watchers in 2019 but has recently put on 45 lbs. He is working at losing weight again and is attending [employer].   PFTs: 04/15/2019 - FVC 3.29 (67%), FEV1 2.93 (80%), ratio 89, TLC 61, DLCOunc 50%  Mild-moderate restriction, no obstruction. No BD response, moderate diffusion defect but corrects for lung volumes  Imaging: CT chest 07/08/2017 - Upper lobe predominant L>R subpleural reticulation with cystic-appearing lesions that may represent early honeycombing  No Known Allergies  Immunization History  Administered Date(s) Administered  . Influenza Inj Mdck Quad Pf 03/23/2019    Past Medical History:  Diagnosis Date  . DVT (deep  venous thrombosis) ([place])   . Factor 5 Leiden mutation, heterozygous ([place])     Tobacco History: Social History   Tobacco Use  Smoking Status Former Smoker  . Packs/day: 0.25  . Years: 44.00  . Pack years: 11.00  . Types: Cigars  . Start date: 10  . Quit date: 03/29/2017  . Years since quitting: 2.0  Smokeless Tobacco Never Used  Tobacco Comment   5-6 cigars daily    Counseling given: Not Answered Comment: 5-6 cigars daily    Outpatient Medications Prior to Visit  Medication Sig Dispense Refill  . albuterol (VENTOLIN HFA) 108 (90 Base) MCG/ACT inhaler Inhale 2 puffs into the lungs every 6 (six) hours as needed for wheezing or shortness of breath. 6.7 g 6  . cetirizine (ZYRTEC) 10 MG tablet Take 10 mg by mouth daily.    . Omega-3 Fatty Acids (FISH OIL) 1000 MG CAPS Take 1 capsule by mouth daily.    Alveda Reasons 20 MG TABS tablet Take 20 mg by mouth daily.  2   No facility-administered medications prior to visit.    Review of Systems  Review of Systems  Constitutional: Negative.   HENT: Negative.   Respiratory: Positive for cough and shortness of breath. Negative for wheezing.        Snoring   Physical Exam  BP 124/80 (BP Location: Left Arm, Cuff Size: Normal)   Pulse 88   Temp (!) 97.5 F (36.4 C) (Temporal)   Ht 5\' 11"  (1.803 m)   Wt 269 lb (122 kg)  SpO2 95%   BMI 37.52 kg/m  Physical Exam Constitutional:      General: He is not in acute distress.    Appearance: Normal appearance. He is obese. He is not ill-appearing.  HENT:     Head: Normocephalic and atraumatic.     Mouth/Throat:     Mouth: Mucous membranes are moist.     Pharynx: Oropharynx is clear.     Comments: Mallampati class I-II Neck:     Musculoskeletal: Normal range of motion and neck supple.  Cardiovascular:     Rate and Rhythm: Normal rate and regular rhythm.  Pulmonary:     Effort: Pulmonary effort is normal. No respiratory distress.     Breath sounds: Normal breath sounds.      Comments: Possible fine crackles L>R base Skin:    General: Skin is warm and dry.  Neurological:     General: No focal deficit present.     Mental Status: He is alert and oriented to person, place, and time. Mental status is at baseline.  Psychiatric:        Mood and Affect: Mood normal.        Behavior: Behavior normal.        Thought Content: Thought content normal.        Judgment: Judgment normal.      Lab Results:  CBC    Component Value Date/Time   WBC 7.5 07/06/2017 0845   WBC 8.5 12/26/2016 2202   RBC 4.85 07/06/2017 0845   RBC 5.04 12/26/2016 2202   HGB 14.0 07/06/2017 0845   HCT 40.3 07/06/2017 0845   PLT 193 07/06/2017 0845   MCV 83.1 07/06/2017 0845   MCH 28.9 07/06/2017 0845   MCH 29.4 12/26/2016 2202   MCHC 34.8 07/06/2017 0845   MCHC 35.2 12/26/2016 2202   RDW 13.9 07/06/2017 0845   LYMPHSABS 1.1 07/06/2017 0845   MONOABS 0.8 07/06/2017 0845   EOSABS 0.2 07/06/2017 0845   BASOSABS 0.1 07/06/2017 0845    BMET    Component Value Date/Time   NA 140 07/06/2017 0845   K 5.1 07/06/2017 0845   CL 106 12/26/2016 2202   CO2 27 07/06/2017 0845   GLUCOSE 87 07/06/2017 0845   BUN 15.1 07/06/2017 0845   CREATININE 1.0 07/06/2017 0845   CALCIUM 9.7 07/06/2017 0845   GFRNONAA >60 12/26/2016 2202   GFRAA >60 12/26/2016 2202    BNP No results found for: BNP  ProBNP No results found for: PROBNP  Imaging: No results found.   Assessment & Plan:   Exertional dyspnea - PFTs today showed no evidence of obstruction, mild-moderate restriction with decreased diffusion capacity - Clinical symptoms not consistent with asthma, most likely d/t reported weight gain and physical deconditioning-  concerned about potential for interstitial lung disease which needs further imaging to rule out as potential cause of dyspnea - Ordering HRCT (hx fibrotic changes on CT)  Snoring - Reported loud snoring  - Moderate risk for sleep apnea d/t BMI 37 - Epworth 6 - Ordering  home sleep test    Glenford Bayley, NP 04/15/2019

## 2019-04-15 NOTE — Progress Notes (Signed)
Agree with HRCT for restrictive lung disease and reduced DLCO.

## 2019-05-09 ENCOUNTER — Ambulatory Visit (INDEPENDENT_AMBULATORY_CARE_PROVIDER_SITE_OTHER)
Admission: RE | Admit: 2019-05-09 | Discharge: 2019-05-09 | Disposition: A | Discharge status: Home / Self Care | Payer: Managed Care, Other (non HMO) | Source: Ambulatory Visit | Attending: Primary Care | Admitting: Primary Care

## 2019-05-09 ENCOUNTER — Telehealth: Payer: Self-pay | Admitting: Primary Care

## 2019-05-09 ENCOUNTER — Other Ambulatory Visit: Payer: Self-pay

## 2019-05-09 DIAGNOSIS — J849 Interstitial pulmonary disease, unspecified: Secondary | ICD-10-CM

## 2019-05-09 NOTE — Telephone Encounter (Signed)
Dr. [name], [facility] are seeing Brent Holloway next week. 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 Substantially progressed since 06/2017 and consistent with UIP. He also had 63mm RUL nodules increased from 56mm- recommending CT chest, PET or tissue sampling in 3 months.  I will call him and let him know the result, this would be a new diagnosis from what I gather. I will send an ILD questionnaire to patient to fill out. Please address at visit.

## 2019-05-10 NOTE — Telephone Encounter (Signed)
ILD packet placed in the mail for patient Brent Holloway sign off

## 2019-05-17 ENCOUNTER — Encounter: Payer: Self-pay | Admitting: Pulmonary Disease

## 2019-05-17 ENCOUNTER — Ambulatory Visit: Payer: Managed Care, Other (non HMO) | Admitting: Pulmonary Disease

## 2019-05-17 ENCOUNTER — Other Ambulatory Visit: Payer: Self-pay

## 2019-05-17 VITALS — BP 136/80 | HR 84 | Temp 97.9°F | Ht 71.0 in | Wt 272.4 lb

## 2019-05-17 DIAGNOSIS — R911 Solitary pulmonary nodule: Secondary | ICD-10-CM | POA: Diagnosis not present

## 2019-05-17 DIAGNOSIS — J849 Interstitial pulmonary disease, unspecified: Secondary | ICD-10-CM | POA: Diagnosis not present

## 2019-05-17 DIAGNOSIS — R0609 Other forms of dyspnea: Secondary | ICD-10-CM

## 2019-05-17 DIAGNOSIS — R0600 Dyspnea, unspecified: Secondary | ICD-10-CM

## 2019-05-17 NOTE — Patient Instructions (Addendum)
Interstitial Lung Disease, unknown cause Initial work-up will include autoimmune labs. Ordered labs through [facility] [employer] [facility] except for ANCA with reflex titer  Chronic hypoxemic respiratory failure secondary to interstitial lung disease Wear supplemental oxygen for goal greater than 88% Please complete your home sleep study to determine if you how severe your oxygen needs are at night  Right upper lobe pulmonary nodule Mediastinal and hilar lymphadenopathy Will schedule PET/CT in 3 months (~08/08/18)  Depending on your results, we will schedule either a bronchoscopy with wash or with biopsy   Follow-up with me in 3 months after your PET/CT

## 2019-05-17 NOTE — Progress Notes (Signed)
Subjective:   PATIENT ID: Brent Holloway GENDER: male DOB: [birth year], MRN: [id number]   HPI  Chief Complaint  Patient presents with  . Follow-up    shortness of breath with activity   Reason for Visit: Follow-up for shortness of breath  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  On our initial consult on 03/30/19, he presented with three month history of gradually worsening shortness of breath with exertion associated with productive cough.  Worsens with heat and humidity. Improves with rest and air conditioning.   On follow-up visit with our Pulmonary NP, PFTs demonstrated moderate restrictive defect with DLCO 50%. He has had prior CT in 2018 which demonstrated findings of mild subpleural reticulation associated with mild bronchiectasis, scattered cysts and multiple pulmonary nodules with the largest <59mm that were stable compared to CT four months prior. CT Chest was repeated for abnormal PFT and chest imaging with progressive fibrotic disease with diffuse subpleural involvement predominantly worse in the basilar regions with honeycombing and traction bronchiectasis.  He overall feels his symptoms are unchanged since the last visit. He is still able to perform his regular activities and chores but is notably more short of breath with moderate exertion. Continues to have associated cough with and without mucous production. Denies wheezing, chest pain. Has chronic lower extremity edema related to his prior blood clots.  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 Quit in 2018.  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  Past Medical History:  Diagnosis Date  . DVT (deep venous thrombosis) ([place])   . Factor 5 Leiden mutation, heterozygous ([place])      Family History  Problem Relation Age of Onset  . Heart disease Father   . Congestive Heart Failure Father   . Dementia Mother      Social History   Occupational History  . Not on file  Tobacco Use  . Smoking status: Former Smoker    Packs/day: 0.25    Years: 44.00    Pack years: 11.00    Types: Cigars    Start date: 53    Quit date: 03/29/2017    Years since quitting: 2.1  . Smokeless tobacco: Never Used  . Tobacco comment: 5-6 cigars daily   Substance and Sexual Activity  . Alcohol use: Yes    Comment: occ  . Drug use: No  . Sexual activity: Not on file    No Known Allergies   Outpatient Medications Prior to Visit  Medication Sig Dispense Refill  . albuterol (VENTOLIN HFA) 108 (90 Base) MCG/ACT inhaler Inhale 2 puffs into the lungs every 6 (six) hours as needed for wheezing or shortness of breath. 6.7 g 6  . cetirizine (ZYRTEC) 10 MG tablet Take 10 mg by mouth daily.    . Omega-3 Fatty Acids (FISH OIL) 1000 MG CAPS Take 1 capsule by mouth daily.    Alveda Reasons 20 MG TABS tablet Take 20 mg by mouth daily.  2   No facility-administered medications prior to visit.  Review of Systems  Constitutional: Positive for malaise/fatigue. Negative for chills, diaphoresis, fever and weight loss.  HENT: Negative for congestion, ear pain and sore throat.   Respiratory: Positive for cough, sputum production and shortness of breath. Negative for hemoptysis and wheezing.   Cardiovascular: Positive for leg swelling. Negative for chest pain and palpitations.  Gastrointestinal: Negative for abdominal pain, heartburn and nausea.  Genitourinary: Negative for frequency.   Musculoskeletal: Negative for joint pain and myalgias.  Skin: Negative for itching and rash.  Neurological: Negative for dizziness, weakness and headaches.  Endo/Heme/Allergies: Does not bruise/bleed easily.  Psychiatric/Behavioral: Negative for depression. The patient is not nervous/anxious.     Objective:   Vitals:   05/17/19 0941  BP: 136/80  Pulse: 84  Temp: 97.9 F (36.6 C)  TempSrc: Temporal  SpO2: 91%  Weight: 272 lb 6.4 oz (123.6 kg)  Height: 5\' 11"  (1.803 m)      Physical Exam: General: BMI 38. Well-appearing, no acute distress HENT: [place], AT Eyes: EOMI, no scleral icterus Respiratory: Bibasilar inspiratory rales Cardiovascular: RRR, -M/R/G, no JVD GI: BS+, soft, nontender Extremities:Mild RLE edema Neuro: AAO x4, CNII-XII grossly intact Skin: RLE chronic venous stasis changes, Intact, no rashes or bruising Psych: Normal mood, normal affect  Data Reviewed:  Imaging: CT chest 07/08/2017 - Upper lobe predominant L>R subpleural reticulation with cystic-appearing lesions that may represent early honeycombing CT Chest 05/09/19 - Progressive subpleural reticulation associated with GGO with diffuse involvement predominantly worse in the basilar regions with honeycombing and traction bronchiectasis. RUL nodule also noted to be increased 536mm>>9mm compared to 07/09/19 imaging.  PFT: 04/15/19 FVC 3.29 (67%) FEV1 2.93 (80%) Ratio 87  TLC 61% DLCO 50% Interpretation: Mild restrictive defect with moderately reduced DLCO  Labs: CBC    Component Value Date/Time   WBC 7.5 07/06/2017 0845   WBC 8.5 12/26/2016 2202   RBC 4.85 07/06/2017 0845   RBC 5.04 12/26/2016 2202   HGB 14.0 07/06/2017 0845   HCT 40.3 07/06/2017 0845   PLT 193 07/06/2017 0845   MCV 83.1 07/06/2017 0845   MCH 28.9 07/06/2017 0845   MCH 29.4 12/26/2016 2202   MCHC 34.8 07/06/2017 0845   MCHC 35.2 12/26/2016 2202   RDW 13.9 07/06/2017 0845   LYMPHSABS 1.1 07/06/2017 0845   MONOABS 0.8 07/06/2017  0845   EOSABS 0.2 07/06/2017 0845   BASOSABS 0.1 07/06/2017 0845   BMET    Component Value Date/Time   NA 140 07/06/2017 0845   K 5.1 07/06/2017 0845   CL 106 12/26/2016 2202   CO2 27 07/06/2017 0845   GLUCOSE 87 07/06/2017 0845   BUN 15.1 07/06/2017 0845   CREATININE 1.0 07/06/2017 0845   CALCIUM 9.7 07/06/2017 0845   GFRNONAA >60 12/26/2016 2202   GFRAA >60 12/26/2016 2202   Ambulatory O2 Patient Saturations on Room Air at Rest = 98%  Patient Saturations on Room Air while Ambulating = 86%  Patient Saturations on 3 Liters of oxygen while Ambulating = 92%  Imaging, labs and test noted above have been reviewed independently by me.    Assessment & Plan:   Discussion: 63 year old male with hx RLE DVT secondary to heterozygous Factor V Leiden who presents for follow-up for progressive ILD suggestive of IPF/UIP. 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 In-clinic ambulatory sats demonstrate new O2 requirement secondary to ILD. We extensively reviewed his chest imaging and PFTs. We discussed  management as noted below.  Interstitial Lung Disease, unknown cause Initial work-up will include autoimmune labs. Ordered labs through [employer] except for ANCA with reflex titer.  Will likely need bronchoscopy for further evaluation of etiology. Will defer procedure pending need for tissue sampling based on PET/CT.  Chronic hypoxemic respiratory failure secondary to interstitial lung disease Wear 3L supplemental oxygen via [place] for goal greater than 88% with exertion and sleep. Please complete your home sleep study to determine if you how severe your oxygen needs are at night  Right upper lobe pulmonary nodule Mediastinal and hilar lymphadenopathy Will schedule PET/CT in 3 months (~08/08/18)  Depending on your results, we will schedule either a bronchoscopy with wash or with biopsy  Health  Maintenance Immunization History  Administered Date(s) Administered  . Influenza Inj Mdck Quad Pf 03/23/2019   Orders Placed This Encounter  Procedures  . NM PET Image Initial (PI) Skull Base To Thigh    Schedule just after 08/08/18    Standing Status:   Future    Standing Expiration Date:   07/16/2020    Order Specific Question:   If indicated for the ordered procedure, I authorize the administration of a radiopharmaceutical per Radiology protocol    Answer:   Yes    Order Specific Question:   Radiology Contrast Protocol - do NOT remove file path    Answer:   [email or web address]  . ANA    Standing Status:   Future    Number of Occurrences:   1    Standing Expiration Date:   05/16/2020  . Rheumatoid factor    Standing Status:   Future    Number of Occurrences:   1    Standing Expiration Date:   05/16/2020  . HIV Antibody (routine testing w rflx)    Standing Status:   Future    Number of Occurrences:   1    Standing Expiration Date:   06/17/2019  . Anti-scleroderma antibody    Standing Status:   Future    Number of Occurrences:   1    Standing Expiration Date:   06/17/2019  . Anti-DNA antibody, double-stranded    Standing Status:   Future    Number of Occurrences:   1    Standing Expiration Date:   05/16/2020  . Aldolase    Standing Status:   Future    Number of Occurrences:   1    Standing Expiration Date:   06/17/2019  . CK    Standing Status:   Future    Number of Occurrences:   1    Standing Expiration Date:   06/17/2019  . ANCA screen with reflex titer    Standing Status:   Future    Number of Occurrences:   1    Standing Expiration Date:   06/17/2019  . Anti-ribonucleic acid antibody    Standing Status:   Future    Number of Occurrences:   1    Standing Expiration Date:   06/17/2019  . Anti-Smith antibody    Standing Status:   Future    Number of Occurrences:   1    Standing Expiration Date:   06/17/2019  . RA  Qn+CCP(IgG/A)+SjoSSA+SjoSSB    Standing Status:   Future    Number of Occurrences:   1    Standing Expiration Date:   05/16/2020  . C-reactive protein    Standing Status:   Future    Number of Occurrences:   1  Standing Expiration Date:   05/16/2020  . Ambulatory Referral for DME    Referral Priority:   Routine    Referral Type:   Durable Medical Equipment Purchase    Number of Visits Requested:   1   No orders of the defined types were placed in this encounter.   Return in about 3 months (around 08/17/2019), or after PT/CT.  Greater than 50% of this patient 60-minute office visit was spent face-to-face in counseling with the patient/family. We discussed medical diagnosis and treatment plan as noted. I also spoke with patient outside of clinic time as noted in addendum below for an additional 40 minutes.  Lemario Chaikin Mechele Collin, MD [facility] 05/17/2019 8:44 AM  Office Number [phone]  ADDENDUM: Patient and wife wished to discuss work-up further. I addressed their questions to the best of my ability. We discussed testing to rule out other causes of his ILD though I suspect his prior work is the culprit. We discussed the progression of the disease and management including oxygen, pulmonary rehab and potential evaluation for transplant if he worsens. We also reviewed some of his available labs that have returned, which so far demonstrates negative autoimmune work-up.  Plan --Will send order of portable oxygen to DME asap. Patient is picking up oxygen tomorrow and would like oxygen that he can take on his upcoming trip. --Will refer to Pulmonary Rehab --Will start enrollment for Esbriet, anti-fibrotic agent --Patient agrees to await PET/CT to evaluate lung nodule with plan for bronchoscopy with BAL +/- biopsy pending results --Follow-up with me in 3 months after PET/CT

## 2019-05-18 ENCOUNTER — Telehealth: Payer: Self-pay | Admitting: Pulmonary Disease

## 2019-05-18 LAB — ANCA SCREEN W REFLEX TITER: ANCA Screen: NEGATIVE

## 2019-05-18 NOTE — Telephone Encounter (Signed)
Called patient and his wife on 05/18/19. See addendum on 05/17/19 progress note for additional details.

## 2019-05-18 NOTE — Telephone Encounter (Signed)
Called and spoke to patient's wife, who is listed on DPR.  If possible she would like Dr. [name] to give her a call back.  Patient's wife stated that Dr. [name] mentioned to patient about lung exercises but that they don't have any instructions on what type of exercises to do or a plan.  Wife is concerned about why they are waiting 3 months for PET scan and not doing anything.  She shared that she is nervous about the wait and wondering if there is any treatment he could do now.  Patient's wife also wanted to ask if patient's condition is one where a lung transplant might be involved.  I did let her know that Dr. [name] may not be available right away but that we will get back with her with answers when she is able.  Dr. [name], please advise. Thank you!

## 2019-05-19 ENCOUNTER — Encounter (HOSPITAL_COMMUNITY): Payer: Self-pay | Admitting: *Deleted

## 2019-05-19 ENCOUNTER — Telehealth: Payer: Self-pay | Admitting: *Deleted

## 2019-05-19 DIAGNOSIS — R0609 Other forms of dyspnea: Secondary | ICD-10-CM

## 2019-05-19 DIAGNOSIS — J849 Interstitial pulmonary disease, unspecified: Secondary | ICD-10-CM

## 2019-05-19 NOTE — Progress Notes (Signed)
Received referral from Dr. [name] for this pt to participate in pulmonary rehab with the diagnosis of ILD. Clinical review of pt follow up appt on 10/27 with Dr. [name] Pulmonary office note.   Pt appropriate for scheduling for Pulmonary rehab. Pt covid risk score is 3.  Will forward to support staff for scheduling and verification of insurance eligibility/benefits with pt  consent. Cherre Huger, BSN Cardiac and Training and development officer

## 2019-05-19 NOTE — Telephone Encounter (Signed)
-----   Message from [place], MD sent at 05/18/2019  8:05 PM EDT ----- Please ensure the following orders ASAP.--Will send order of portable oxygen to DME asap. Patient is picking up oxygen tomorrow and would like oxygen that he can take on his upcoming trip.--Will refer to Pulmonary Rehab--Will start enrollment for Esbriet, anti-fibrotic agent

## 2019-05-19 NOTE — Telephone Encounter (Signed)
ATC patient, unable to reach Orders placed Try to let patient know we needed him to come into office. Left message to call back

## 2019-05-20 ENCOUNTER — Telehealth (HOSPITAL_COMMUNITY): Payer: Self-pay

## 2019-05-30 ENCOUNTER — Telehealth (HOSPITAL_COMMUNITY): Payer: Self-pay | Admitting: *Deleted

## 2019-05-30 ENCOUNTER — Telehealth (HOSPITAL_COMMUNITY): Payer: Self-pay | Admitting: Family Medicine

## 2019-05-30 NOTE — Telephone Encounter (Signed)
Returned call to patient to reschedule walk test/orientation for pulmonary rehab to 06/13/2019 @ 1030.

## 2019-05-31 ENCOUNTER — Ambulatory Visit: Payer: Managed Care, Other (non HMO) | Admitting: Cardiology

## 2019-06-02 LAB — ANA: Anti Nuclear Antibody (ANA): NEGATIVE

## 2019-06-02 LAB — HIV ANTIBODY (ROUTINE TESTING W REFLEX): HIV Screen 4th Generation wRfx: NONREACTIVE

## 2019-06-02 LAB — C-REACTIVE PROTEIN: CRP: 4 mg/L (ref 0–10)

## 2019-06-02 LAB — ANTI-DNA ANTIBODY, DOUBLE-STRANDED: dsDNA Ab: 1 IU/mL (ref 0–9)

## 2019-06-02 LAB — RA QN+CCP(IGG/A)+SJOSSA+SJOSSB
Cyclic Citrullin Peptide Ab: 6 units (ref 0–19)
ENA SSA (RO) Ab: 0.5 AI (ref 0.0–0.9)
ENA SSB (LA) Ab: <0.2 AI (ref 0.0–0.9)
Rheumatoid fact SerPl-aCnc: 13.1 IU/mL (ref 0.0–13.9)

## 2019-06-02 LAB — ALDOLASE: Aldolase: 7 U/L (ref 3.3–10.3)

## 2019-06-02 LAB — CK: Total CK: 129 U/L (ref 41–331)

## 2019-06-02 LAB — ANTI-RIBONUCLEIC ACID ANTIBODY: Sm/rnp: 11.2 EU/ml

## 2019-06-02 LAB — ANTI-SCLERODERMA ANTIBODY: Scleroderma (Scl-70) (ENA) Antibody, IgG: <0.2 AI (ref 0.0–0.9)

## 2019-06-06 ENCOUNTER — Ambulatory Visit (HOSPITAL_COMMUNITY): Payer: Managed Care, Other (non HMO)

## 2019-06-09 ENCOUNTER — Telehealth: Payer: Self-pay | Admitting: Pulmonary Disease

## 2019-06-09 NOTE — Telephone Encounter (Signed)
Spoke to pt's wife and made her aware I will call next month to schedule PET and Dr Cordelia Pen schedule for January hasn't been set up yet.  Told her I will call her next month to schedule.  She states ok and nothing further needed.

## 2019-06-09 NOTE — Telephone Encounter (Signed)
atc patient unable to reach left message for patient to call back or come by office to sign paperwork for esbriet.  Paperwork in [place] blue sign folder, in black bin on counter in pod C.

## 2019-06-09 NOTE — Telephone Encounter (Signed)
Pt wife returned call and would like a call back to speak with Judeen Hammans

## 2019-06-09 NOTE — Telephone Encounter (Signed)
I have pt's PET scan order.  It is for pt to have it in January.  I tried to call pt's wife back to let her know we schedule these the month prior and also she wants to make appt with Dr [name] and her schedule isn't out for January yet.  I had to leave a vm for her to call me back.

## 2019-06-13 ENCOUNTER — Ambulatory Visit: Payer: Managed Care, Other (non HMO)

## 2019-06-13 ENCOUNTER — Other Ambulatory Visit: Payer: Self-pay

## 2019-06-13 ENCOUNTER — Encounter (HOSPITAL_COMMUNITY)
Admission: RE | Admit: 2019-06-13 | Discharge: 2019-06-13 | Disposition: A | Discharge status: Home / Self Care | Payer: Managed Care, Other (non HMO) | Source: Ambulatory Visit | Attending: Pulmonary Disease | Admitting: Pulmonary Disease

## 2019-06-13 VITALS — BP 142/80 | HR 85 | Ht 71.0 in | Wt 276.5 lb

## 2019-06-13 DIAGNOSIS — J849 Interstitial pulmonary disease, unspecified: Secondary | ICD-10-CM

## 2019-06-13 DIAGNOSIS — G4733 Obstructive sleep apnea (adult) (pediatric): Secondary | ICD-10-CM | POA: Diagnosis not present

## 2019-06-13 DIAGNOSIS — R0683 Snoring: Secondary | ICD-10-CM

## 2019-06-13 NOTE — Progress Notes (Signed)
Brent Holloway 63 y.o. male Pulmonary Rehab Orientation Note Patient arrived today in Cardiac and Pulmonary Rehab for orientation and walk test to Pulmonary Rehab. He walked from the [place] lot carry oxygen and was quite short of breath with that activity.He does carry portable oxygen. Per pt, he uses oxygen continuously. Color good, skin warm and dry. Patient is oriented to time and place. Patient's medical history, psychosocial health, and medications reviewed. Psychosocial assessment reveals pt lives with their spouse. Pt is currently full time job doing Dealer work for trucks. Pt hobbies include raising and showing dogs and going on trips in his new RV. Pt reports his stress level is low. Areas of stress/anxiety include Health.  Pt does not exhibit  signs of depression.  PHQ2/9 score 0/0. Pt shows good  coping skills with positive outlook . Will continue to monitor and evaluate progress toward psychosocial goal(s) of no barriers or psychosocial concerns while in pulmonary rehab. Physical assessment reveals heart rate is normal, breath sounds clear to auscultation, no wheezes, rales, or rhonchi. Grip strength equal, strong. Patient reports he does take medications as prescribed. Patient states he follows a Regular diet. The patient reports no specific efforts to gain or lose weight. He lost 75 pounds 1 year ago on Weight Watcher's but has gained approximately 50 pounds back and he and his wife are ready to go back to the Weight Watcher's guidelines. Patient's weight will be monitored closely. Demonstration and practice of PLB using pulse oximeter. Patient able to return demonstration satisfactorily. Safety and hand hygiene in the exercise area reviewed with patient. Patient voices understanding of the information reviewed. Department expectations discussed with patient and achievable goals were set. The patient shows enthusiasm about attending the program and we look forward to working with this nice  gentleman. The patient completed a 6 min walk test today and to begin exercise on 06/21/2019 in the 1345 class.  [id number]

## 2019-06-13 NOTE — Progress Notes (Signed)
Pulmonary Individual Treatment Plan  Patient Details  Name: Gracy BruinsWilliam Mauriello MRN: [id number] Date of Birth: 10/07/1955 Referring Provider:     [facility]  Referring Provider  Dr. Everardo AllEllison      Initial Encounter Date:    Pulmonary Rehab Walk [facility]  Date  06/13/19      Visit Diagnosis: Interstitial lung disease (HCC)  Patient's Home Medications on Admission:   Current Outpatient Medications:  .  albuterol (VENTOLIN HFA) 108 (90 Base) MCG/ACT inhaler, Inhale 2 puffs into the lungs every 6 (six) hours as needed for wheezing or shortness of breath., Disp: 6.7 g, Rfl: 6 .  cetirizine (ZYRTEC) 10 MG tablet, Take 10 mg by mouth daily., Disp: , Rfl:  .  Omega-3 Fatty Acids (FISH OIL) 1000 MG CAPS, Take 1 capsule by mouth daily., Disp: , Rfl:  .  XARELTO 20 MG TABS tablet, Take 20 mg by mouth daily., Disp: , Rfl: 2  Past Medical History: Past Medical History:  Diagnosis Date  . DVT (deep venous thrombosis) (HCC)   . Factor 5 Leiden mutation, heterozygous (HCC)     Tobacco Use: Social History   Tobacco Use  Smoking Status Former Smoker  . Packs/day: 0.25  . Years: 44.00  . Pack years: 11.00  . Types: Cigars  . Start date: 741974  . Quit date: 03/29/2017  . Years since quitting: 2.2  Smokeless Tobacco Never Used  Tobacco Comment   5-6 cigars daily     Labs: Recent Review Flowsheet Data    There is no flowsheet data to display.      Capillary Blood Glucose: No results found for: GLUCAP   Pulmonary Assessment Scores: Pulmonary Assessment Scores    Row Name 06/13/19 1123 06/13/19 1216       ADL UCSD   ADL Phase  Entry  Entry    SOB Score total  58  -      CAT Score   CAT Score  17  -      mMRC Score   mMRC Score  -  4      UCSD: Self-administered rating of dyspnea associated with activities of daily living (ADLs) 6-point scale  (0 = "not at all" to 5 = "maximal or unable to do because of breathlessness")  Scoring Scores range from 0 to 120.  Minimally important difference is 5 units  CAT: CAT can identify the health impairment of COPD patients and is better correlated with disease progression.  CAT has a scoring range of zero to 40. The CAT score is classified into four groups of low (less than 10), medium (10 - 20), high (21-30) and very high (31-40) based on the impact level of disease on health status. A CAT score over 10 suggests significant symptoms.  A worsening CAT score could be explained by an exacerbation, poor medication adherence, poor inhaler technique, or progression of COPD or comorbid conditions.  CAT MCID is 2 points  mMRC: mMRC (Modified Medical Research Council) Dyspnea Scale is used to assess the degree of baseline functional disability in patients of respiratory disease due to dyspnea. No minimal important difference is established. A decrease in score of 1 point or greater is considered a positive change.   Pulmonary Function Assessment: Pulmonary Function Assessment - 06/13/19 1123      Breath   Shortness of Breath  Yes;Limiting activity  Exercise Target Goals: Exercise Program Goal: Individual exercise prescription set using results from initial 6 min walk test and THRR while considering  patient's activity barriers and safety.   Exercise Prescription Goal: Initial exercise prescription builds to 30-45 minutes a day of aerobic activity, 2-3 days per week.  Home exercise guidelines will be given to patient during program as part of exercise prescription that the participant will acknowledge.  Activity Barriers & Risk Stratification: Activity Barriers & Cardiac Risk Stratification - 06/13/19 1117      Activity Barriers & Cardiac Risk Stratification   Activity Barriers  Back Problems;Arthritis;Deconditioning;Muscular Weakness;Shortness of Breath       6 Minute Walk: 6 Minute  Walk    Row Name 06/13/19 1217         6 Minute Walk   Phase  Initial     Distance  659 feet     Walk Time  5 minutes     # of Rest Breaks  1     MPH  1.25     METS  2.01     RPE  12     Perceived Dyspnea   2     VO2 Peak  7.05     Symptoms  Yes (comment)     Comments  1 min standing rest due to desat     Resting HR  75 bpm     Resting BP  144/90     Resting Oxygen Saturation   100 %     Exercise Oxygen Saturation  during 6 min walk  80 %     Max Ex. HR  121 bpm     Max Ex. BP  164/92     2 Minute Post BP  130/78       Interval HR   1 Minute HR  106     2 Minute HR  117     3 Minute HR  111     4 Minute HR  101     5 Minute HR  114     6 Minute HR  121     2 Minute Post HR  97     Interval Heart Rate?  Yes       Interval Oxygen   Interval Oxygen?  Yes     Baseline Oxygen Saturation %  100 %     1 Minute Oxygen Saturation %  98 %     1 Minute Liters of Oxygen  3 L     2 Minute Oxygen Saturation %  81 %     2 Minute Liters of Oxygen  3 L     3 Minute Oxygen Saturation %  80 %     3 Minute Liters of Oxygen  4 L     4 Minute Oxygen Saturation %  96 %     4 Minute Liters of Oxygen  4 L     5 Minute Oxygen Saturation %  93 %     5 Minute Liters of Oxygen  4 L     6 Minute Oxygen Saturation %  86 %     6 Minute Liters of Oxygen  4 L     2 Minute Post Oxygen Saturation %  91 %     2 Minute Post Liters of Oxygen  3 L        Oxygen Initial Assessment: Oxygen Initial Assessment - 06/13/19 1215      Home Oxygen   Home  Oxygen Device  Portable Concentrator;E-Tanks    Sleep Oxygen Prescription  Continuous    Liters per minute  3    Home Exercise Oxygen Prescription  Continuous    Liters per minute  3    Home at Rest Exercise Oxygen Prescription  Continuous    Liters per minute  3    Compliance with Home Oxygen Use  Yes      Initial 6 min Walk   Oxygen Used  Continuous    Liters per minute  4      Program Oxygen Prescription   Program Oxygen Prescription   Continuous    Liters per minute  4    Comments  may need to increase to 6      Intervention   Short Term Goals  To learn and exhibit compliance with exercise, home and travel O2 prescription;To learn and understand importance of monitoring SPO2 with pulse oximeter and demonstrate accurate use of the pulse oximeter.;To learn and understand importance of maintaining oxygen saturations>88%;To learn and demonstrate proper pursed lip breathing techniques or other breathing techniques.;To learn and demonstrate proper use of respiratory medications    Long  Term Goals  Exhibits compliance with exercise, home and travel O2 prescription;Verbalizes importance of monitoring SPO2 with pulse oximeter and return demonstration;Maintenance of O2 saturations>88%;Exhibits proper breathing techniques, such as pursed lip breathing or other method taught during program session;Compliance with respiratory medication       Oxygen Re-Evaluation:   Oxygen Discharge (Final Oxygen Re-Evaluation):   Initial Exercise Prescription: Initial Exercise Prescription - 06/13/19 1200      Date of Initial Exercise RX and Referring Provider   Date  06/13/19    Referring Provider  Dr. Everardo All      Oxygen   Oxygen  Continuous    Liters  4      Treadmill   MPH  1.2    Grade  1    Minutes  15      NuStep   Level  2    SPM  80    Minutes  15      Prescription Details   Frequency (times per week)  2    Duration  Progress to 30 minutes of continuous aerobic without signs/symptoms of physical distress      Intensity   THRR 40-80% of Max Heartrate  63-126    Ratings of Perceived Exertion  11-13    Perceived Dyspnea  0-4      Progression   Progression  Continue progressive overload as per policy without signs/symptoms or physical distress.      Resistance Training   Training Prescription  Yes    Weight  blue bands    Reps  10-15       Perform Capillary Blood Glucose checks as needed.  Exercise  Prescription Changes:   Exercise Comments:   Exercise Goals and Review: Exercise Goals    Row Name 06/13/19 1223             Exercise Goals   Increase Physical Activity  Yes       Intervention  Provide advice, education, support and counseling about physical activity/exercise needs.;Develop an individualized exercise prescription for aerobic and resistive training based on initial evaluation findings, risk stratification, comorbidities and participant's personal goals.       Expected Outcomes  Short Term: Attend rehab on a regular basis to increase amount of physical activity.;Long Term: Add in home exercise to make exercise part of routine and  to increase amount of physical activity.;Long Term: Exercising regularly at least 3-5 days a week.       Increase Strength and Stamina  Yes       Intervention  Provide advice, education, support and counseling about physical activity/exercise needs.;Develop an individualized exercise prescription for aerobic and resistive training based on initial evaluation findings, risk stratification, comorbidities and participant's personal goals.       Expected Outcomes  Short Term: Increase workloads from initial exercise prescription for resistance, speed, and METs.;Short Term: Perform resistance training exercises routinely during rehab and add in resistance training at home;Long Term: Improve cardiorespiratory fitness, muscular endurance and strength as measured by increased METs and functional capacity (6MWT)       Able to understand and use rate of perceived exertion (RPE) scale  Yes       Intervention  Provide education and explanation on how to use RPE scale       Expected Outcomes  Short Term: Able to use RPE daily in rehab to express subjective intensity level;Long Term:  Able to use RPE to guide intensity level when exercising independently       Able to understand and use Dyspnea scale  Yes       Intervention  Provide education and explanation on how  to use Dyspnea scale       Expected Outcomes  Short Term: Able to use Dyspnea scale daily in rehab to express subjective sense of shortness of breath during exertion;Long Term: Able to use Dyspnea scale to guide intensity level when exercising independently       Knowledge and understanding of Target Heart Rate Range (THRR)  Yes       Expected Outcomes  Short Term: Able to state/look up THRR;Short Term: Able to use daily as guideline for intensity in rehab;Long Term: Able to use THRR to govern intensity when exercising independently       Understanding of Exercise Prescription  Yes       Intervention  Provide education, explanation, and written materials on patient's individual exercise prescription       Expected Outcomes  Short Term: Able to explain program exercise prescription;Long Term: Able to explain home exercise prescription to exercise independently          Exercise Goals Re-Evaluation :   Discharge Exercise Prescription (Final Exercise Prescription Changes):   Nutrition:  Target Goals: Understanding of nutrition guidelines, daily intake of sodium 1500mg , cholesterol 200mg , calories 30% from fat and 7% or less from saturated fats, daily to have 5 or more servings of fruits and vegetables.  Biometrics: Pre Biometrics - 06/13/19 1159      Pre Biometrics   Grip Strength  45 kg        Nutrition Therapy Plan and Nutrition Goals:   Nutrition Assessments:   Nutrition Goals Re-Evaluation:   Nutrition Goals Discharge (Final Nutrition Goals Re-Evaluation):   Psychosocial: Target Goals: Acknowledge presence or absence of significant depression and/or stress, maximize coping skills, provide positive support system. Participant is able to verbalize types and ability to use techniques and skills needed for reducing stress and depression.  Initial Review & Psychosocial Screening:   Quality of Life Scores:  Scores of 19 and below usually indicate a poorer quality of life  in these areas.  A difference of  2-3 points is a clinically meaningful difference.  A difference of 2-3 points in the total score of the Quality of Life Index has been associated with significant improvement in overall  quality of life, self-image, physical symptoms, and general health in studies assessing change in quality of life.  PHQ-9: Recent Review Flowsheet Data    Depression screen [facility] 2/9 06/13/2019 06/13/2019   Decreased Interest 0 0   Down, Depressed, Hopeless 0 0   PHQ - 2 Score 0 0   Altered sleeping 0 -   Tired, decreased energy 0 -   Change in appetite 0 -   Feeling bad or failure about yourself  0 -   Trouble concentrating 0 -   Moving slowly or fidgety/restless 0 -   Suicidal thoughts 0 -   PHQ-9 Score 0 -   Difficult doing work/chores Not difficult at all -     Interpretation of Total Score  Total Score Depression Severity:  1-4 = Minimal depression, 5-9 = Mild depression, 10-14 = Moderate depression, 15-19 = Moderately severe depression, 20-27 = Severe depression   Psychosocial Evaluation and Intervention:   Psychosocial Re-Evaluation:   Psychosocial Discharge (Final Psychosocial Re-Evaluation):   Education: Education Goals: Education classes will be provided on a weekly basis, covering required topics. Participant will state understanding/return demonstration of topics presented.  Learning Barriers/Preferences:   Education Topics: Risk Factor Reduction:  -Group instruction that is supported by a PowerPoint presentation. Instructor discusses the definition of a risk factor, different risk factors for pulmonary disease, and how the heart and lungs work together.     Nutrition for Pulmonary Patient:  -Group instruction provided by PowerPoint slides, verbal discussion, and written materials to support subject matter. The instructor gives an explanation and review of healthy diet recommendations, which includes a discussion on weight management,  recommendations for fruit and vegetable consumption, as well as protein, fluid, caffeine, fiber, sodium, sugar, and alcohol. Tips for eating when patients are short of breath are discussed.   Pursed Lip Breathing:  -Group instruction that is supported by demonstration and informational handouts. Instructor discusses the benefits of pursed lip and diaphragmatic breathing and detailed demonstration on how to preform both.     Oxygen Safety:  -Group instruction provided by PowerPoint, verbal discussion, and written material to support subject matter. There is an overview of "What is Oxygen" and "Why do we need it".  Instructor also reviews how to create a safe environment for oxygen use, the importance of using oxygen as prescribed, and the risks of noncompliance. There is a brief discussion on traveling with oxygen and resources the patient may utilize.   Oxygen Equipment:  -Group instruction provided by [facility] Staff utilizing handouts, written materials, and equipment demonstrations.   Signs and Symptoms:  -Group instruction provided by written material and verbal discussion to support subject matter. Warning signs and symptoms of infection, stroke, and heart attack are reviewed and when to call the physician/911 reinforced. Tips for preventing the spread of infection discussed.   Advanced Directives:  -Group instruction provided by verbal instruction and written material to support subject matter. Instructor reviews Advanced Directive laws and proper instruction for filling out document.   Pulmonary Video:  -Group video education that reviews the importance of medication and oxygen compliance, exercise, good nutrition, pulmonary hygiene, and pursed lip and diaphragmatic breathing for the pulmonary patient.   Exercise for the Pulmonary Patient:  -Group instruction that is supported by a PowerPoint presentation. Instructor discusses benefits of exercise, core components of exercise,  frequency, duration, and intensity of an exercise routine, importance of utilizing pulse oximetry during exercise, safety while exercising, and options of places to exercise outside of rehab.  Pulmonary Medications:  -Verbally interactive group education provided by instructor with focus on inhaled medications and proper administration.   Anatomy and Physiology of the Respiratory System and Intimacy:  -Group instruction provided by PowerPoint, verbal discussion, and written material to support subject matter. Instructor reviews respiratory cycle and anatomical components of the respiratory system and their functions. Instructor also reviews differences in obstructive and restrictive respiratory diseases with examples of each. Intimacy, Sex, and Sexuality differences are reviewed with a discussion on how relationships can change when diagnosed with pulmonary disease. Common sexual concerns are reviewed.   MD DAY -A group question and answer session with a medical doctor that allows participants to ask questions that relate to their pulmonary disease state.   OTHER EDUCATION -Group or individual verbal, written, or video instructions that support the educational goals of the pulmonary rehab program.   Holiday Eating Survival Tips:  -Group instruction provided by PowerPoint slides, verbal discussion, and written materials to support subject matter. The instructor gives patients tips, tricks, and techniques to help them not only survive but enjoy the holidays despite the onslaught of food that accompanies the holidays.   Knowledge Questionnaire Score: Knowledge Questionnaire Score - 06/13/19 1137      Knowledge Questionnaire Score   Pre Score  16/18       Core Components/Risk Factors/Patient Goals at Admission: Personal Goals and Risk Factors at Admission - 06/13/19 1137      Core Components/Risk Factors/Patient Goals on Admission    Weight Management  Weight Loss;Yes    Admit  Weight  276 lb 7.3 oz (125.4 kg)    Goal Weight: Short Term  270 lb (122.5 kg)    Goal Weight: Long Term  250 lb (113.4 kg)    Improve shortness of breath with ADL's  Yes    Intervention  Provide education, individualized exercise plan and daily activity instruction to help decrease symptoms of SOB with activities of daily living.    Expected Outcomes  Short Term: Improve cardiorespiratory fitness to achieve a reduction of symptoms when performing ADLs;Long Term: Be able to perform more ADLs without symptoms or delay the onset of symptoms       Core Components/Risk Factors/Patient Goals Review:    Core Components/Risk Factors/Patient Goals at Discharge (Final Review):    ITP Comments:   Comments:

## 2019-06-21 ENCOUNTER — Encounter (HOSPITAL_COMMUNITY)
Admission: RE | Admit: 2019-06-21 | Discharge: 2019-06-21 | Disposition: A | Discharge status: Home / Self Care | Payer: Managed Care, Other (non HMO) | Source: Ambulatory Visit | Attending: Pulmonary Disease | Admitting: Pulmonary Disease

## 2019-06-21 ENCOUNTER — Other Ambulatory Visit: Payer: Self-pay

## 2019-06-21 VITALS — Wt 276.5 lb

## 2019-06-21 DIAGNOSIS — Z87891 Personal history of nicotine dependence: Secondary | ICD-10-CM | POA: Diagnosis not present

## 2019-06-21 DIAGNOSIS — J849 Interstitial pulmonary disease, unspecified: Secondary | ICD-10-CM | POA: Diagnosis present

## 2019-06-21 DIAGNOSIS — Z20828 Contact with and (suspected) exposure to other viral communicable diseases: Secondary | ICD-10-CM | POA: Insufficient documentation

## 2019-06-21 NOTE — Progress Notes (Signed)
Daily Session Note  Patient Details  Name: Brent Holloway MRN: [id number] Date of Birth: 09-29-1955 Referring Provider:     Pulmonary Rehab Walk Test from 06/13/2019 in [place]  Referring Provider  Dr. [name]      Encounter Date: 06/21/2019  Check In: Session Check In - 06/21/19 1340      Check-In   Supervising physician immediately available to respond to emergencies  [facility] immediately available    Physician(s)  Dr. Eliseo Squires    Location  [facility]    Staff Present  Rosebud Poles, RN, Bjorn Loser, MS, Exercise Physiologist;Nivia Gervase Ysidro Evert, RN    Virtual Visit  No    Medication changes reported      No    Fall or balance concerns reported     No    Tobacco Cessation  No Change    Warm-up and Cool-down  Performed on first and last piece of equipment    Resistance Training Performed  Yes    VAD Patient?  No    PAD/SET Patient?  No      Pain Assessment   Currently in Pain?  No/denies    Multiple Pain Sites  No       Capillary Blood Glucose: No results found for this or any previous visit (from the past 24 hour(s)).  Exercise Prescription Changes - 06/21/19 1500      Response to Exercise   Blood Pressure (Admit)  126/64    Blood Pressure (Exercise)  144/70    Blood Pressure (Exit)  124/78    Heart Rate (Admit)  98 bpm    Heart Rate (Exercise)  134 bpm   When O2 sat down to 78%   Heart Rate (Exit)  100 bpm    Oxygen Saturation (Admit)  99 %    Oxygen Saturation (Exercise)  78 %   On treadmill, O2 increased to 6 L   Oxygen Saturation (Exit)  100 %    Rating of Perceived Exertion (Exercise)  13    Perceived Dyspnea (Exercise)  3    Duration  Progress to 30 minutes of  aerobic without signs/symptoms of physical distress    Intensity  Other (comment)      Progression   Progression  --   40-80% of HRR     Resistance Training   Training Prescription  Yes    Weight  blue bands    Reps  10-15    Time  10  Minutes      Oxygen   Oxygen  Continuous    Liters  4-6      Treadmill   MPH  1.4    Grade  1    Minutes  15      NuStep   Level  2    SPM  80    Minutes  15    METs  1.5       Social History   Tobacco Use  Smoking Status Former Smoker  . Packs/day: 0.25  . Years: 44.00  . Pack years: 11.00  . Types: Cigars  . Start date: 3  . Quit date: 03/29/2017  . Years since quitting: 2.2  Smokeless Tobacco Never Used  Tobacco Comment   5-6 cigars daily     Goals Met:  No report of cardiac concerns or symptoms Strength training completed today  Goals Unmet:  O2 Sat  Comments: Service time is from 1335 to 1450    Dr.  Rush Farmer is Medical Director for Pulmonary Rehab at [facility].

## 2019-06-23 ENCOUNTER — Encounter (HOSPITAL_COMMUNITY)
Admission: RE | Admit: 2019-06-23 | Discharge: 2019-06-23 | Disposition: A | Discharge status: Home / Self Care | Payer: Managed Care, Other (non HMO) | Source: Ambulatory Visit | Attending: Pulmonary Disease | Admitting: Pulmonary Disease

## 2019-06-23 ENCOUNTER — Other Ambulatory Visit: Payer: Self-pay

## 2019-06-23 DIAGNOSIS — J849 Interstitial pulmonary disease, unspecified: Secondary | ICD-10-CM

## 2019-06-23 NOTE — Progress Notes (Signed)
Daily Session Note  Patient Details  Name: Markis Langland MRN: [id number] Date of Birth: February 03, 1956 Referring Provider:     Pulmonary Rehab Walk Test from 06/13/2019 in [place]  Referring Provider  Dr. [name]      Encounter Date: 06/23/2019  Check In: Session Check In - 06/23/19 1448      Check-In   Supervising physician immediately available to respond to emergencies  [facility] immediately available    Physician(s)  Dr. Benny Lennert    Location  [facility]    Staff Present  Rosebud Poles, RN, Bjorn Loser, MS, Exercise Physiologist;Marri Mcneff Ysidro Evert, RN    Virtual Visit  No    Medication changes reported      No    Fall or balance concerns reported     No    Tobacco Cessation  No Change    Warm-up and Cool-down  Performed on first and last piece of equipment    Resistance Training Performed  Yes    VAD Patient?  No    PAD/SET Patient?  No      Pain Assessment   Currently in Pain?  No/denies    Multiple Pain Sites  No       Capillary Blood Glucose: No results found for this or any previous visit (from the past 24 hour(s)).    Social History   Tobacco Use  Smoking Status Former Smoker  . Packs/day: 0.25  . Years: 44.00  . Pack years: 11.00  . Types: Cigars  . Start date: 37  . Quit date: 03/29/2017  . Years since quitting: 2.2  Smokeless Tobacco Never Used  Tobacco Comment   5-6 cigars daily     Goals Met:  Exercise tolerated well No report of cardiac concerns or symptoms Strength training completed today  Goals Unmet:  Not Applicable  Comments: Service time is from 1335 to 1448    Dr. Rush Farmer is Medical Director for Pulmonary Rehab at [facility].

## 2019-06-24 DIAGNOSIS — G4733 Obstructive sleep apnea (adult) (pediatric): Secondary | ICD-10-CM

## 2019-06-27 ENCOUNTER — Encounter: Payer: Self-pay | Admitting: Cardiology

## 2019-06-27 ENCOUNTER — Telehealth (INDEPENDENT_AMBULATORY_CARE_PROVIDER_SITE_OTHER): Payer: Managed Care, Other (non HMO) | Admitting: Cardiology

## 2019-06-27 ENCOUNTER — Other Ambulatory Visit: Payer: Self-pay

## 2019-06-27 VITALS — Ht 71.0 in

## 2019-06-27 DIAGNOSIS — D682 Hereditary deficiency of other clotting factors: Secondary | ICD-10-CM | POA: Diagnosis not present

## 2019-06-27 DIAGNOSIS — R0602 Shortness of breath: Secondary | ICD-10-CM

## 2019-06-27 DIAGNOSIS — I77810 Thoracic aortic ectasia: Secondary | ICD-10-CM | POA: Diagnosis not present

## 2019-06-27 NOTE — Patient Instructions (Signed)
Your physician recommends that you continue on your current medications as directed. Please refer to the Current Medication list given to you today.  *If you need a refill on your cardiac medications before your next appointment, please call your pharmacy*  Lab Work: NOne If you have labs (blood work) drawn today and your tests are completely normal, you will receive your results only by: . MyChart Message (if you have MyChart) OR . A paper copy in the mail If you have any lab test that is abnormal or we need to change your treatment, we will call you to review the results.  Testing/Procedures: None  Follow-Up: At [facility], you and your health needs are our priority.  As part of our continuing mission to provide you with exceptional heart care, we have created designated Provider Care Teams.  These Care Teams include your primary Cardiologist (physician) and Advanced Practice Providers (APPs -  Physician Assistants and Nurse Practitioners) who all work together to provide you with the care you need, when you need it.  Your next appointment:   6 month(s)  The format for your next appointment:   In Person  Provider:   Kardie Tobb, DO  Other Instructions   

## 2019-06-27 NOTE — Progress Notes (Signed)
Telephonw   Date:  06/27/2019   ID:  Brent Holloway, DOB 04-13-1956, MRN [id number]  The patient is at home. I am in the office.   PCP:  Ileana Ladd, MD  Cardiologist:  Thomasene Ripple, DO  Electrophysiologist:  None   Evaluation Performed:  Telephone Follow-up visit  Chief Complaint:  Follow up visit.  History of Present Illness:    Brent Holloway is a 63 y.o. male with a hx of factor V Leiden mutation with previous DVT currently on Xarelto, recent CT scan with characteristics suggestive of interstitial lung disease, former smoker.  I did see the patient on 04/30/2019 at which time a total hysterectomy which breath was secondary to his lung pathology.  I recommended the patient follow-up with pulmonary especially given his abnormal CAT scan.  Since that time he has seen pulmonary he has been started on oxygen and is going to pulmonary rehab.   The patient does not have symptoms concerning for COVID-19 infection.  He offers no complaints at this time.  He denies any chest pain, lightheadedness or dizziness.  His shortness of breath is his baseline.  Past Medical History:  Diagnosis Date  . DVT (deep venous thrombosis) (HCC)   . Factor 5 Leiden mutation, heterozygous [facility])    Past Surgical History:  Procedure Laterality Date  . APPENDECTOMY  1976  . KNEE SURGERY Right      Current Meds  Medication Sig  . cetirizine (ZYRTEC) 10 MG tablet Take 10 mg by mouth daily.  . Omega-3 Fatty Acids (FISH OIL) 1000 MG CAPS Take 1 capsule by mouth daily.  Carlena Hurl 20 MG TABS tablet Take 20 mg by mouth daily.  . [DISCONTINUED] albuterol (VENTOLIN HFA) 108 (90 Base) MCG/ACT inhaler Inhale 2 puffs into the lungs every 6 (six) hours as needed for wheezing or shortness of breath.     Allergies:   Patient has no known allergies.   Social History   Tobacco Use  . Smoking status: Former Smoker    Packs/day: 0.25    Years: 44.00    Pack years: 11.00    Types: Cigars    Start date: 58   Quit date: 03/29/2017    Years since quitting: 2.2  . Smokeless tobacco: Never Used  . Tobacco comment: 5-6 cigars daily   Substance Use Topics  . Alcohol use: Yes    Comment: occ  . Drug use: No     Family Hx: The patient's family history includes Congestive Heart Failure in his father; Dementia in his mother; Heart disease in his father.  ROS:   Review of Systems  Constitution: Negative for decreased appetite, fever and weight gain.  HENT: Negative for congestion, ear discharge, hoarse voice and sore throat.   Eyes: Negative for discharge, redness, vision loss in right eye and visual halos.  Cardiovascular: Negative for chest pain, dyspnea on exertion, leg swelling, orthopnea and palpitations.  Respiratory: Reports baseline shortness of breath.  Negative for cough, hemoptysis, and snoring.   Endocrine: Negative for heat intolerance and polyphagia.  Hematologic/Lymphatic: Negative for bleeding problem. Does not bruise/bleed easily.  Skin: Negative for flushing, nail changes, rash and suspicious lesions.  Musculoskeletal: Negative for arthritis, joint pain, muscle cramps, myalgias, neck pain and stiffness.  Gastrointestinal: Negative for abdominal pain, bowel incontinence, diarrhea and excessive appetite.  Genitourinary: Negative for decreased libido, genital sores and incomplete emptying.  Neurological: Negative for brief paralysis, focal weakness, headaches and loss of balance.  Psychiatric/Behavioral: Negative for altered mental  status, depression and suicidal ideas.  Allergic/Immunologic: Negative for HIV exposure and persistent infections.   Prior CV studies:   The following studies were reviewed today:  Transthoracic echocardiogram IMPRESSIONS    1. The left ventricle has normal systolic function with an ejection fraction of 60-65%. The cavity size was normal. There is mildly increased left ventricular wall thickness. Left ventricular diastolic Doppler parameters are  indeterminate.  2. The right ventricle has normal systolic function. The cavity was normal. There is no increase in right ventricular wall thickness.  3. The aorta is normal unless otherwise noted.  4. There is mild dilatation of the aortic root measuring 38 mm.  Labs/Other Tests and Data Reviewed:    EKG: None today  Recent Labs: No results found for requested labs within last 8760 hours.   Recent Lipid Panel No results found for: CHOL, TRIG, HDL, CHOLHDL, LDLCALC, LDLDIRECT  Wt Readings from Last 3 Encounters:  06/21/19 276 lb 7.3 oz (125.4 kg)  06/13/19 276 lb 7.3 oz (125.4 kg)  05/17/19 272 lb 6.4 oz (123.6 kg)     Objective:    Vital Signs:  Ht 5\' 11"  (1.803 m)   BMI 38.56 kg/m    Patient does not have reports of his blood pressure and heart rate.  Virtual visit no physical exam performed.  ASSESSMENT & PLAN:    Shortness of breath-as stated in prior visit I do not believe his shortness of breath is cardiac in nature.  His interstitial lung disease appears to be worsening.  He has been placed on oxygen and is going to pulmonary rehab.  The patient tells me that he is pending evaluation  for potential lung transplant.  In terms of his mildly dilated aortic root advised the patient that we will continue to monitor his blood pressure aggressively to make sure that it is within target of less than 130/80 mmHg.  Continue his Xarelto for his factor V Leiden.  Follow-up in 6 months.  COVID-19 Education: The signs and symptoms of COVID-19 were discussed with the patient and how to seek care for testing (follow up with PCP or arrange E-visit). The importance of social distancing was discussed today.  Time:   Today, I have spent 15 minutes with the patient with telehealth technology discussing the above problems.     Medication Adjustments/Labs and Tests Ordered: Current medicines are reviewed at length with the patient today.  Concerns regarding medicines are outlined  above.   Tests Ordered: No orders of the defined types were placed in this encounter.   Medication Changes: No orders of the defined types were placed in this encounter.   Follow Up:   6 months  Signed, Berniece Salines, DO  06/27/2019 10:08 AM    [facility]

## 2019-06-28 ENCOUNTER — Telehealth: Payer: Self-pay | Admitting: Pulmonary Disease

## 2019-06-28 ENCOUNTER — Telehealth: Payer: Self-pay

## 2019-06-28 ENCOUNTER — Encounter (HOSPITAL_COMMUNITY)
Admission: RE | Admit: 2019-06-28 | Discharge: 2019-06-28 | Disposition: A | Discharge status: Home / Self Care | Payer: Managed Care, Other (non HMO) | Source: Ambulatory Visit | Attending: Pulmonary Disease | Admitting: Pulmonary Disease

## 2019-06-28 DIAGNOSIS — J849 Interstitial pulmonary disease, unspecified: Secondary | ICD-10-CM | POA: Diagnosis not present

## 2019-06-28 NOTE — Progress Notes (Signed)
Daily Session Note  Patient Details  Name: Brent Holloway MRN: [id number] Date of Birth: 1956/04/25 Referring Provider:     Pulmonary Rehab Walk Test from 06/13/2019 in [place]  Referring Provider  Dr. [name]      Encounter Date: 06/28/2019  Check In: Session Check In - 06/28/19 1454      Check-In   Supervising physician immediately available to respond to emergencies  [facility] immediately available    Physician(s)  Dr. Benny Lennert    Location  [facility]    Staff Present  Rosebud Poles, RN, Bjorn Loser, MS, Exercise Physiologist;Malacai Grantz Ysidro Evert, RN    Virtual Visit  No    Medication changes reported      No    Fall or balance concerns reported     No    Tobacco Cessation  No Change    Warm-up and Cool-down  Performed on first and last piece of equipment    Resistance Training Performed  Yes    VAD Patient?  No    PAD/SET Patient?  No      Pain Assessment   Currently in Pain?  No/denies    Multiple Pain Sites  No       Capillary Blood Glucose: No results found for this or any previous visit (from the past 24 hour(s)).    Social History   Tobacco Use  Smoking Status Former Smoker  . Packs/day: 0.25  . Years: 44.00  . Pack years: 11.00  . Types: Cigars  . Start date: 36  . Quit date: 03/29/2017  . Years since quitting: 2.2  Smokeless Tobacco Never Used  Tobacco Comment   5-6 cigars daily     Goals Met:  No report of cardiac concerns or symptoms Strength training completed today  Goals Unmet:  Not Applicable  Comments: Service time is from 1343 to 1512 On arrival today pt was on his oxygen 2 liters, saturation was 81%. We had explained to [facility] that he needs to use 6 liters of oxygen with walking. He states that he didn't have his glasses and he thought that it was on 6 liters.We placed him on 4 liters and rested his saturation improved to 96%. During exercise he is requiring 4 liters of oxygen for  seated exercise and 8 liters for walking on the treadmill.   Dr. Rush Farmer is Medical Director for Pulmonary Rehab at [facility].

## 2019-06-28 NOTE — Telephone Encounter (Signed)
ATC pt, no answer. Left message for pt to call back.  

## 2019-06-28 NOTE — Progress Notes (Signed)
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 DVT (deep venous thrombosis) ([place])   . Factor 5 Leiden mutation, heterozygous ([place])      Medications reviewed.   Current Outpatient Medications:  .  cetirizine (ZYRTEC) 10 MG tablet, Take 10 mg by mouth daily., Disp: , Rfl:  .  Omega-3 Fatty Acids (FISH OIL) 1000 MG CAPS, Take 1 capsule by mouth daily., Disp: , Rfl:  .  XARELTO 20 MG TABS tablet, Take 20 mg by mouth daily., Disp: , Rfl: 2   Ht Readings from Last 1 Encounters:  06/27/19 5\' 11"  (1.803 m)     Wt Readings from Last 3 Encounters:  06/21/19 276 lb 7.3 oz (125.4 kg)  06/13/19 276 lb 7.3 oz (125.4 kg)  05/17/19 272 lb 6.4 oz (123.6 kg)     There is no height or weight on file to calculate BMI.   Social History   Tobacco Use  Smoking Status Former Smoker  . Packs/day: 0.25  . Years: 44.00  . Pack years: 11.00  . Types: Cigars  . Start date: 5  . Quit date: 03/29/2017  . Years since quitting: 2.2  Smokeless Tobacco Never Used  Tobacco Comment   5-6 cigars daily      No results found for: CHOL No results found for: HDL No results found for: LDLCALC No results found for: TRIG No results found for: CHOLHDL   No results found for: HGBA1C   CBG (last 3)  No results for input(s): GLUCAP in the last 72 hours.    Nutrition Diagnosis ? Food-and nutrition-related knowledge deficit related to lack of exposure to information as related to diagnosis of ILD and history of weight cycling  Obese  II = 35-39.9 related to excessive energy intake as evidenced by a 38.5  Nutrition Intervention   ? Continue client-centered nutrition education by RD, as part of interdisciplinary care.  Goal(s) ? Pt to identify and limit food sources of sodium ? Pt to identify food quantities necessary to achieve weight loss of 6-24 lb at graduation from pulmonary rehab.   Plan:   Will provide client-centered nutrition education as  part of interdisciplinary care  Monitor and evaluate progress toward nutrition goal with team.   Michaele Offer, MS, RDN, LDN

## 2019-06-28 NOTE — Telephone Encounter (Signed)
Sleep study was brought to me by [facility].   LMTCB with patient. Will route to triage to f/u.   HST results:  Severe OSA, AHI 34.1/hr with oxygen desaturations.   Recommendation: Cpap titration study.

## 2019-06-28 NOTE — Telephone Encounter (Signed)
Ok to schedule for next Tuesday in-person. If patient has more urgent need to be seen, please contact the NP of the day.  Rodman Pickle, M.D. [facility] Pulmonary/Critical Care Medicine 06/28/2019 7:01 PM

## 2019-06-28 NOTE — Telephone Encounter (Signed)
Spoke with Brent Holloway, she states he is getting worse with his breathing with increased activity. He is not been able to do things like he was doing before. She noticed that he is getting down about it and wants to know if there is any medication he could start on. He is supposed to come back in January but they are wondering he should be seen sooner. She also has questions about a lung transplant. Should we schedule an appt for pt to discuss. JE has appts on Tuesday and they are willing to come in. JE please advise.

## 2019-06-29 NOTE — Telephone Encounter (Signed)
Spoke with the pt's spouse and notified of recs The pt has already been scheduled for this appt  Nothing further needed

## 2019-06-29 NOTE — Telephone Encounter (Signed)
LMTCB x2 for pt.  Message will routed to [facility] for follow up as Beth ordered the home sleep study.

## 2019-06-30 ENCOUNTER — Other Ambulatory Visit: Payer: Self-pay

## 2019-06-30 ENCOUNTER — Encounter (HOSPITAL_COMMUNITY)
Admission: RE | Admit: 2019-06-30 | Discharge: 2019-06-30 | Disposition: A | Discharge status: Home / Self Care | Payer: Managed Care, Other (non HMO) | Source: Ambulatory Visit | Attending: Pulmonary Disease | Admitting: Pulmonary Disease

## 2019-06-30 DIAGNOSIS — J849 Interstitial pulmonary disease, unspecified: Secondary | ICD-10-CM

## 2019-06-30 NOTE — Progress Notes (Signed)
Daily Session Note  Patient Details  Name: Brent Holloway MRN: [id number] Date of Birth: 1955/12/18 Referring Provider:     Pulmonary Rehab Walk Test from 06/13/2019 in [place]  Referring Provider  Dr. [name]      Encounter Date: 06/30/2019  Check In: Session Check In - 06/30/19 1512      Check-In   Supervising physician immediately available to respond to emergencies  [facility] immediately available    Physician(s)  Dr. Benny Lennert    Location  [facility]    Staff Present  Rosebud Poles, RN, Bjorn Loser, MS, Exercise Physiologist;[name], RN    Virtual Visit  No    Medication changes reported      No    Fall or balance concerns reported     No    Tobacco Cessation  No Change    Warm-up and Cool-down  Performed as group-led instruction    Resistance Training Performed  Yes    VAD Patient?  No    PAD/SET Patient?  No      Pain Assessment   Currently in Pain?  No/denies    Multiple Pain Sites  No       Capillary Blood Glucose: No results found for this or any previous visit (from the past 24 hour(s)).    Social History   Tobacco Use  Smoking Status Former Smoker  . Packs/day: 0.25  . Years: 44.00  . Pack years: 11.00  . Types: Cigars  . Start date: 35  . Quit date: 03/29/2017  . Years since quitting: 2.2  Smokeless Tobacco Never Used  Tobacco Comment   5-6 cigars daily     Goals Met:  Exercise tolerated well No report of cardiac concerns or symptoms Strength training completed today  Goals Unmet:  Not Applicable  Comments: Service time is from 1320 to 30    Dr. Rush Farmer is Medical Director for Pulmonary Rehab at [facility].

## 2019-07-05 ENCOUNTER — Other Ambulatory Visit (HOSPITAL_COMMUNITY)
Admission: RE | Admit: 2019-07-05 | Discharge: 2019-07-05 | Disposition: A | Discharge status: Home / Self Care | Payer: Managed Care, Other (non HMO) | Source: Ambulatory Visit | Attending: Pulmonary Disease | Admitting: Pulmonary Disease

## 2019-07-05 ENCOUNTER — Other Ambulatory Visit: Payer: Self-pay

## 2019-07-05 ENCOUNTER — Telehealth: Payer: Self-pay | Admitting: Pulmonary Disease

## 2019-07-05 ENCOUNTER — Encounter: Payer: Self-pay | Admitting: Pulmonary Disease

## 2019-07-05 ENCOUNTER — Encounter (HOSPITAL_COMMUNITY)
Admission: RE | Admit: 2019-07-05 | Discharge: 2019-07-05 | Disposition: A | Discharge status: Home / Self Care | Payer: Managed Care, Other (non HMO) | Source: Ambulatory Visit | Attending: Pulmonary Disease | Admitting: Pulmonary Disease

## 2019-07-05 ENCOUNTER — Ambulatory Visit: Payer: Managed Care, Other (non HMO) | Admitting: Pulmonary Disease

## 2019-07-05 VITALS — BP 138/88 | Temp 97.2°F | Ht 71.0 in | Wt 275.6 lb

## 2019-07-05 VITALS — Wt 272.5 lb

## 2019-07-05 DIAGNOSIS — G4733 Obstructive sleep apnea (adult) (pediatric): Secondary | ICD-10-CM | POA: Diagnosis not present

## 2019-07-05 DIAGNOSIS — J849 Interstitial pulmonary disease, unspecified: Secondary | ICD-10-CM | POA: Diagnosis not present

## 2019-07-05 DIAGNOSIS — Z20828 Contact with and (suspected) exposure to other viral communicable diseases: Secondary | ICD-10-CM | POA: Insufficient documentation

## 2019-07-05 DIAGNOSIS — J84112 Idiopathic pulmonary fibrosis: Secondary | ICD-10-CM | POA: Diagnosis not present

## 2019-07-05 DIAGNOSIS — Z01812 Encounter for preprocedural laboratory examination: Secondary | ICD-10-CM | POA: Insufficient documentation

## 2019-07-05 DIAGNOSIS — J9611 Chronic respiratory failure with hypoxia: Secondary | ICD-10-CM | POA: Diagnosis not present

## 2019-07-05 NOTE — Telephone Encounter (Signed)
88mw results are in patient's chart from today. Results have the distance the patient walked and sats. Will route to [place] so she is aware.

## 2019-07-05 NOTE — Progress Notes (Signed)
Pulmonary Individual Treatment Plan  Patient Details  Name: Brent Holloway MRN: [id number] Date of Birth: 1955/11/03 Referring Provider:     Pulmonary Rehab Walk Test from 06/13/2019 in [place]  Referring Provider  Dr. [name]      Initial Encounter Date:    Pulmonary Rehab Walk Test from 06/13/2019 in [place]  Date  06/13/19      Visit Diagnosis: Interstitial lung disease ([place])  Patient's Home Medications on Admission:   Current Outpatient Medications:  .  cetirizine (ZYRTEC) 10 MG tablet, Take 10 mg by mouth daily., Disp: , Rfl:  .  Omega-3 Fatty Acids (FISH OIL) 1000 MG CAPS, Take 1 capsule by mouth daily., Disp: , Rfl:  .  XARELTO 20 MG TABS tablet, Take 20 mg by mouth daily., Disp: , Rfl: 2  Past Medical History: Past Medical History:  Diagnosis Date  . DVT (deep venous thrombosis) ([place])   . Factor 5 Leiden mutation, heterozygous ([place])     Tobacco Use: Social History   Tobacco Use  Smoking Status Former Smoker  . Packs/day: 0.25  . Years: 44.00  . Pack years: 11.00  . Types: Cigars  . Start date: 16  . Quit date: 03/29/2017  . Years since quitting: 2.2  Smokeless Tobacco Never Used  Tobacco Comment   5-6 cigars daily     Labs: Recent Review Flowsheet Data    There is no flowsheet data to display.      Capillary Blood Glucose: No results found for: GLUCAP   Pulmonary Assessment Scores: Pulmonary Assessment Scores    Row Name 06/13/19 1123 06/13/19 1216       ADL UCSD   ADL Phase  Entry  Entry    SOB Score total  58  --      CAT Score   CAT Score  17  --      mMRC Score   mMRC Score  --  4      UCSD: Self-administered rating of dyspnea associated with activities of daily living (ADLs) 6-point scale (0 = "not at all" to 5 = "maximal or unable to do because of breathlessness")  Scoring Scores range from 0 to 120.  Minimally important difference is 5 units  CAT: CAT can  identify the health impairment of COPD patients and is better correlated with disease progression.  CAT has a scoring range of zero to 40. The CAT score is classified into four groups of low (less than 10), medium (10 - 20), high (21-30) and very high (31-40) based on the impact level of disease on health status. A CAT score over 10 suggests significant symptoms.  A worsening CAT score could be explained by an exacerbation, poor medication adherence, poor inhaler technique, or progression of COPD or comorbid conditions.  CAT MCID is 2 points  mMRC: mMRC (Modified Medical Research Council) Dyspnea Scale is used to assess the degree of baseline functional disability in patients of respiratory disease due to dyspnea. No minimal important difference is established. A decrease in score of 1 point or greater is considered a positive change.   Pulmonary Function Assessment: Pulmonary Function Assessment - 06/13/19 1123      Breath   Shortness of Breath  Yes;Limiting activity       Exercise Target Goals: Exercise Program Goal: Individual exercise prescription set using results from initial 6 min walk test and THRR while considering  patient's activity barriers and safety.   Exercise  Prescription Goal: Initial exercise prescription builds to 30-45 minutes a day of aerobic activity, 2-3 days per week.  Home exercise guidelines will be given to patient during program as part of exercise prescription that the participant will acknowledge.  Activity Barriers & Risk Stratification: Activity Barriers & Cardiac Risk Stratification - 06/13/19 1117      Activity Barriers & Cardiac Risk Stratification   Activity Barriers  Back Problems;Arthritis;Deconditioning;Muscular Weakness;Shortness of Breath       6 Minute Walk: 6 Minute Walk    Row Name 06/13/19 1217         6 Minute Walk   Phase  Initial     Distance  659 feet     Walk Time  5 minutes     # of Rest Breaks  1     MPH  1.25     METS   2.01     RPE  12     Perceived Dyspnea   2     VO2 Peak  7.05     Symptoms  Yes (comment)     Comments  1 min standing rest due to desat     Resting HR  75 bpm     Resting BP  144/90     Resting Oxygen Saturation   100 %     Exercise Oxygen Saturation  during 6 min walk  80 %     Max Ex. HR  121 bpm     Max Ex. BP  164/92     2 Minute Post BP  130/78       Interval HR   1 Minute HR  106     2 Minute HR  117     3 Minute HR  111     4 Minute HR  101     5 Minute HR  114     6 Minute HR  121     2 Minute Post HR  97     Interval Heart Rate?  Yes       Interval Oxygen   Interval Oxygen?  Yes     Baseline Oxygen Saturation %  100 %     1 Minute Oxygen Saturation %  98 %     1 Minute Liters of Oxygen  3 L     2 Minute Oxygen Saturation %  81 %     2 Minute Liters of Oxygen  3 L     3 Minute Oxygen Saturation %  80 %     3 Minute Liters of Oxygen  4 L     4 Minute Oxygen Saturation %  96 %     4 Minute Liters of Oxygen  4 L     5 Minute Oxygen Saturation %  93 %     5 Minute Liters of Oxygen  4 L     6 Minute Oxygen Saturation %  86 %     6 Minute Liters of Oxygen  4 L     2 Minute Post Oxygen Saturation %  91 %     2 Minute Post Liters of Oxygen  3 L        Oxygen Initial Assessment: Oxygen Initial Assessment - 06/13/19 1215      Home Oxygen   Home Oxygen Device  Portable Concentrator;E-Tanks    Sleep Oxygen Prescription  Continuous    Liters per minute  3    Home Exercise Oxygen Prescription  Continuous  Liters per minute  3    Home at Rest Exercise Oxygen Prescription  Continuous    Liters per minute  3    Compliance with Home Oxygen Use  Yes      Initial 6 min Walk   Oxygen Used  Continuous    Liters per minute  4      Program Oxygen Prescription   Program Oxygen Prescription  Continuous    Liters per minute  4    Comments  may need to increase to 6      Intervention   Short Term Goals  To learn and exhibit compliance with exercise, home and travel  O2 prescription;To learn and understand importance of monitoring SPO2 with pulse oximeter and demonstrate accurate use of the pulse oximeter.;To learn and understand importance of maintaining oxygen saturations>88%;To learn and demonstrate proper pursed lip breathing techniques or other breathing techniques.;To learn and demonstrate proper use of respiratory medications    Long  Term Goals  Exhibits compliance with exercise, home and travel O2 prescription;Verbalizes importance of monitoring SPO2 with pulse oximeter and return demonstration;Maintenance of O2 saturations>88%;Exhibits proper breathing techniques, such as pursed lip breathing or other method taught during program session;Compliance with respiratory medication       Oxygen Re-Evaluation: Oxygen Re-Evaluation    Row Name 07/04/19 1007             Program Oxygen Prescription   Program Oxygen Prescription  Continuous       Liters per minute  8       Comments  4 L while seated and 8 L on TM         Home Oxygen   Home Oxygen Device  Portable Concentrator;E-Tanks       Sleep Oxygen Prescription  Continuous       Liters per minute  3       Home Exercise Oxygen Prescription  Continuous       Liters per minute  8       Home at Rest Exercise Oxygen Prescription  Continuous       Liters per minute  3       Compliance with Home Oxygen Use  No Pt routinely walks in to rehab from car on 2 L and sats <80%         Goals/Expected Outcomes   Short Term Goals  To learn and exhibit compliance with exercise, home and travel O2 prescription;To learn and understand importance of monitoring SPO2 with pulse oximeter and demonstrate accurate use of the pulse oximeter.;To learn and understand importance of maintaining oxygen saturations>88%;To learn and demonstrate proper pursed lip breathing techniques or other breathing techniques.;To learn and demonstrate proper use of respiratory medications       Long  Term Goals  Exhibits compliance with  exercise, home and travel O2 prescription;Verbalizes importance of monitoring SPO2 with pulse oximeter and return demonstration;Maintenance of O2 saturations>88%;Exhibits proper breathing techniques, such as pursed lip breathing or other method taught during program session;Compliance with respiratory medication       Goals/Expected Outcomes  compliance          Oxygen Discharge (Final Oxygen Re-Evaluation): Oxygen Re-Evaluation - 07/04/19 1007      Program Oxygen Prescription   Program Oxygen Prescription  Continuous    Liters per minute  8    Comments  4 L while seated and 8 L on TM      Home Oxygen   Home Oxygen Device  Portable Concentrator;E-Tanks  Sleep Oxygen Prescription  Continuous    Liters per minute  3    Home Exercise Oxygen Prescription  Continuous    Liters per minute  8    Home at Rest Exercise Oxygen Prescription  Continuous    Liters per minute  3    Compliance with Home Oxygen Use  No   Pt routinely walks in to rehab from car on 2 L and sats <80%     Goals/Expected Outcomes   Short Term Goals  To learn and exhibit compliance with exercise, home and travel O2 prescription;To learn and understand importance of monitoring SPO2 with pulse oximeter and demonstrate accurate use of the pulse oximeter.;To learn and understand importance of maintaining oxygen saturations>88%;To learn and demonstrate proper pursed lip breathing techniques or other breathing techniques.;To learn and demonstrate proper use of respiratory medications    Long  Term Goals  Exhibits compliance with exercise, home and travel O2 prescription;Verbalizes importance of monitoring SPO2 with pulse oximeter and return demonstration;Maintenance of O2 saturations>88%;Exhibits proper breathing techniques, such as pursed lip breathing or other method taught during program session;Compliance with respiratory medication    Goals/Expected Outcomes  compliance       Initial Exercise Prescription: Initial  Exercise Prescription - 06/13/19 1200      Date of Initial Exercise RX and Referring Provider   Date  06/13/19    Referring Provider  Dr. [name]      Oxygen   Oxygen  Continuous    Liters  4      Treadmill   MPH  1.2    Grade  1    Minutes  15      NuStep   Level  2    SPM  80    Minutes  15      Prescription Details   Frequency (times per week)  2    Duration  Progress to 30 minutes of continuous aerobic without signs/symptoms of physical distress      Intensity   THRR 40-80% of Max Heartrate  63-126    Ratings of Perceived Exertion  11-13    Perceived Dyspnea  0-4      Progression   Progression  Continue progressive overload as per policy without signs/symptoms or physical distress.      Resistance Training   Training Prescription  Yes    Weight  blue bands    Reps  10-15       Perform Capillary Blood Glucose checks as needed.  Exercise Prescription Changes: Exercise Prescription Changes    Row Name 06/21/19 1500 07/05/19 1400           Response to Exercise   Blood Pressure (Admit)  126/64  128/74      Blood Pressure (Exercise)  144/70  140/72      Blood Pressure (Exit)  124/78  128/70      Heart Rate (Admit)  98 bpm  91 bpm      Heart Rate (Exercise)  134 bpm When O2 sat down to 78%  122 bpm      Heart Rate (Exit)  100 bpm  101 bpm      Oxygen Saturation (Admit)  99 %  98 %      Oxygen Saturation (Exercise)  78 % On treadmill, O2 increased to 6 L  89 %      Oxygen Saturation (Exit)  100 %  98 %      Rating of Perceived Exertion (Exercise)  13  12      Perceived Dyspnea (Exercise)  3  1      Duration  Progress to 30 minutes of  aerobic without signs/symptoms of physical distress  Continue with 30 min of aerobic exercise without signs/symptoms of physical distress.      Intensity  Other (comment)  THRR unchanged        Progression   Progression  -- 40-80% of HRR  Continue to progress workloads to maintain intensity without signs/symptoms of physical  distress.        Resistance Training   Training Prescription  Yes  Yes      Weight  blue bands  blue bands      Reps  10-15  10-15      Time  10 Minutes  10 Minutes        Oxygen   Oxygen  Continuous  Continuous      Liters  4-6  4-8        Treadmill   MPH  1.4  1.6      Grade  1  1      Minutes  15  15        NuStep   Level  2  3      SPM  80  80      Minutes  15  15      METs  1.5  2.1         Exercise Comments:   Exercise Goals and Review: Exercise Goals    Row Name 06/13/19 1223 07/04/19 1008           Exercise Goals   Increase Physical Activity  Yes  Yes      Intervention  Provide advice, education, support and counseling about physical activity/exercise needs.;Develop an individualized exercise prescription for aerobic and resistive training based on initial evaluation findings, risk stratification, comorbidities and participant's personal goals.  Provide advice, education, support and counseling about physical activity/exercise needs.;Develop an individualized exercise prescription for aerobic and resistive training based on initial evaluation findings, risk stratification, comorbidities and participant's personal goals.      Expected Outcomes  Short Term: Attend rehab on a regular basis to increase amount of physical activity.;Long Term: Add in home exercise to make exercise part of routine and to increase amount of physical activity.;Long Term: Exercising regularly at least 3-5 days a week.  Short Term: Attend rehab on a regular basis to increase amount of physical activity.;Long Term: Add in home exercise to make exercise part of routine and to increase amount of physical activity.;Long Term: Exercising regularly at least 3-5 days a week.      Increase Strength and Stamina  Yes  Yes      Intervention  Provide advice, education, support and counseling about physical activity/exercise needs.;Develop an individualized exercise prescription for aerobic and resistive  training based on initial evaluation findings, risk stratification, comorbidities and participant's personal goals.  Provide advice, education, support and counseling about physical activity/exercise needs.;Develop an individualized exercise prescription for aerobic and resistive training based on initial evaluation findings, risk stratification, comorbidities and participant's personal goals.      Expected Outcomes  Short Term: Increase workloads from initial exercise prescription for resistance, speed, and METs.;Short Term: Perform resistance training exercises routinely during rehab and add in resistance training at home;Long Term: Improve cardiorespiratory fitness, muscular endurance and strength as measured by increased METs and functional capacity (6MWT)  Short Term: Increase workloads from initial exercise prescription for resistance,  speed, and METs.;Short Term: Perform resistance training exercises routinely during rehab and add in resistance training at home;Long Term: Improve cardiorespiratory fitness, muscular endurance and strength as measured by increased METs and functional capacity (6MWT)      Able to understand and use rate of perceived exertion (RPE) scale  Yes  Yes      Intervention  Provide education and explanation on how to use RPE scale  Provide education and explanation on how to use RPE scale      Expected Outcomes  Short Term: Able to use RPE daily in rehab to express subjective intensity level;Long Term:  Able to use RPE to guide intensity level when exercising independently  Short Term: Able to use RPE daily in rehab to express subjective intensity level;Long Term:  Able to use RPE to guide intensity level when exercising independently      Able to understand and use Dyspnea scale  Yes  Yes      Intervention  Provide education and explanation on how to use Dyspnea scale  Provide education and explanation on how to use Dyspnea scale      Expected Outcomes  Short Term: Able to use  Dyspnea scale daily in rehab to express subjective sense of shortness of breath during exertion;Long Term: Able to use Dyspnea scale to guide intensity level when exercising independently  Short Term: Able to use Dyspnea scale daily in rehab to express subjective sense of shortness of breath during exertion;Long Term: Able to use Dyspnea scale to guide intensity level when exercising independently      Knowledge and understanding of Target Heart Rate Range (THRR)  Yes  Yes      Intervention  --  Provide education and explanation of THRR including how the numbers were predicted and where they are located for reference      Expected Outcomes  Short Term: Able to state/look up THRR;Short Term: Able to use daily as guideline for intensity in rehab;Long Term: Able to use THRR to govern intensity when exercising independently  Short Term: Able to state/look up THRR;Short Term: Able to use daily as guideline for intensity in rehab;Long Term: Able to use THRR to govern intensity when exercising independently      Understanding of Exercise Prescription  Yes  Yes      Intervention  Provide education, explanation, and written materials on patient's individual exercise prescription  Provide education, explanation, and written materials on patient's individual exercise prescription      Expected Outcomes  Short Term: Able to explain program exercise prescription;Long Term: Able to explain home exercise prescription to exercise independently  Short Term: Able to explain program exercise prescription;Long Term: Able to explain home exercise prescription to exercise independently         Exercise Goals Re-Evaluation : Exercise Goals Re-Evaluation    [place] Name 07/04/19 1009             Exercise Goal Re-Evaluation   Exercise Goals Review  Increase Physical Activity;Increase Strength and Stamina;Able to understand and use rate of perceived exertion (RPE) scale;Able to understand and use Dyspnea scale;Knowledge and  understanding of Target Heart Rate Range (THRR);Understanding of Exercise Prescription       Comments  Pt has completed 4 exercise sessions. It was determined after his first two visits that he needs 8 L while walking on the treadmill. I will be able to progress pt now that his oxygen needs have been determined. Pt currently exercises at 2.1 METs on the stepper.  Will continue to monitor and progress as able.       Expected Outcomes  Through exercise at rehab and at home, the patient will decrease shortness of breath with daily activities and feel confident in carrying out an exercise regime at home.          Discharge Exercise Prescription (Final Exercise Prescription Changes): Exercise Prescription Changes - 07/05/19 1400      Response to Exercise   Blood Pressure (Admit)  128/74    Blood Pressure (Exercise)  140/72    Blood Pressure (Exit)  128/70    Heart Rate (Admit)  91 bpm    Heart Rate (Exercise)  122 bpm    Heart Rate (Exit)  101 bpm    Oxygen Saturation (Admit)  98 %    Oxygen Saturation (Exercise)  89 %    Oxygen Saturation (Exit)  98 %    Rating of Perceived Exertion (Exercise)  12    Perceived Dyspnea (Exercise)  1    Duration  Continue with 30 min of aerobic exercise without signs/symptoms of physical distress.    Intensity  THRR unchanged      Progression   Progression  Continue to progress workloads to maintain intensity without signs/symptoms of physical distress.      Resistance Training   Training Prescription  Yes    Weight  blue bands    Reps  10-15    Time  10 Minutes      Oxygen   Oxygen  Continuous    Liters  4-8      Treadmill   MPH  1.6    Grade  1    Minutes  15      NuStep   Level  3    SPM  80    Minutes  15    METs  2.1       Nutrition:  Target Goals: Understanding of nutrition guidelines, daily intake of sodium <1566m, cholesterol <2040m calories 30% from fat and 7% or less from saturated fats, daily to have 5 or more servings of  fruits and vegetables.  Biometrics: Pre Biometrics - 06/13/19 1159      Pre Biometrics   Grip Strength  45 kg        Nutrition Therapy Plan and Nutrition Goals: Nutrition Therapy & Goals - 06/28/19 1517      Personal Nutrition Goals   Nutrition Goal  Pt to identify food quantities necessary to achieve weight loss of 6-24 lb at graduation from pulmonary rehab.    Personal Goal #2  Pt to identify and limit food sources of sodium      Intervention Plan   Intervention  Prescribe, educate and counsel regarding individualized specific dietary modifications aiming towards targeted core components such as weight, hypertension, lipid management, diabetes, heart failure and other comorbidities.;Nutrition handout(s) given to patient.    Expected Outcomes  Short Term Goal: Understand basic principles of dietary content, such as calories, fat, sodium, cholesterol and nutrients.;Short Term Goal: A plan has been developed with personal nutrition goals set during dietitian appointment.;Long Term Goal: Adherence to prescribed nutrition plan.       Nutrition Assessments: Nutrition Assessments - 06/28/19 1517      Rate Your Plate Scores   Pre Score  59       Nutrition Goals Re-Evaluation: Nutrition Goals Re-Evaluation    [place] 06/28/19 1525             Goals   Current Weight  276 lb (125.2 kg)       Nutrition Goal  Pt to identify food quantities necessary to achieve weight loss of 6-24 lb at graduation from pulmonary rehab.         Personal Goal #2 Re-Evaluation   Personal Goal #2  Pt to identify and limit food sources of sodium          Nutrition Goals Discharge (Final Nutrition Goals Re-Evaluation): Nutrition Goals Re-Evaluation - 06/28/19 1525      Goals   Current Weight  276 lb (125.2 kg)    Nutrition Goal  Pt to identify food quantities necessary to achieve weight loss of 6-24 lb at graduation from pulmonary rehab.      Personal Goal #2 Re-Evaluation   Personal Goal #2   Pt to identify and limit food sources of sodium       Psychosocial: Target Goals: Acknowledge presence or absence of significant depression and/or stress, maximize coping skills, provide positive support system. Participant is able to verbalize types and ability to use techniques and skills needed for reducing stress and depression.  Initial Review & Psychosocial Screening:   Quality of Life Scores:  Scores of 19 and below usually indicate a poorer quality of life in these areas.  A difference of  2-3 points is a clinically meaningful difference.  A difference of 2-3 points in the total score of the Quality of Life Index has been associated with significant improvement in overall quality of life, self-image, physical symptoms, and general health in studies assessing change in quality of life.  PHQ-9: Recent Review Flowsheet Data    Depression screen [facility] 2/9 06/13/2019 06/13/2019   Decreased Interest 0 0   Down, Depressed, Hopeless 0 0   PHQ - 2 Score 0 0   Altered sleeping 0 -   Tired, decreased energy 0 -   Change in appetite 0 -   Feeling bad or failure about yourself  0 -   Trouble concentrating 0 -   Moving slowly or fidgety/restless 0 -   Suicidal thoughts 0 -   PHQ-9 Score 0 -   Difficult doing work/chores Not difficult at all -     Interpretation of Total Score  Total Score Depression Severity:  1-4 = Minimal depression, 5-9 = Mild depression, 10-14 = Moderate depression, 15-19 = Moderately severe depression, 20-27 = Severe depression   Psychosocial Evaluation and Intervention: Psychosocial Evaluation - 07/04/19 1338      Psychosocial Evaluation & Interventions   Interventions  Encouraged to exercise with the program and follow exercise prescription    Continue Psychosocial Services   No Follow up required       Psychosocial Re-Evaluation: Psychosocial Re-Evaluation    [place] Name 07/04/19 1338             Psychosocial Re-Evaluation   Current issues with  None  Identified       Comments  Brent Holloway has no barriers or psychosocial concerns at this time, he has participated in pulmonary rehab for 2 weeks.       Expected Outcomes  That Brent Holloway will continue to have no barriers or psychosocial concerns while participating in pulmonary rehab.       Interventions  Encouraged to attend Pulmonary Rehabilitation for the exercise       Continue Psychosocial Services   No Follow up required          Psychosocial Discharge (Final Psychosocial Re-Evaluation): Psychosocial Re-Evaluation - 07/04/19 1338  Psychosocial Re-Evaluation   Current issues with  None Identified    Comments  Brent Holloway has no barriers or psychosocial concerns at this time, he has participated in pulmonary rehab for 2 weeks.    Expected Outcomes  That Brent Holloway will continue to have no barriers or psychosocial concerns while participating in pulmonary rehab.    Interventions  Encouraged to attend Pulmonary Rehabilitation for the exercise    Continue Psychosocial Services   No Follow up required       Education: Education Goals: Education classes will be provided on a weekly basis, covering required topics. Participant will state understanding/return demonstration of topics presented.  Learning Barriers/Preferences:   Education Topics: Risk Factor Reduction:  -Group instruction that is supported by a PowerPoint presentation. Instructor discusses the definition of a risk factor, different risk factors for pulmonary disease, and how the heart and lungs work together.     Nutrition for Pulmonary Patient:  -Group instruction provided by PowerPoint slides, verbal discussion, and written materials to support subject matter. The instructor gives an explanation and review of healthy diet recommendations, which includes a discussion on weight management, recommendations for fruit and vegetable consumption, as well as protein, fluid, caffeine, fiber, sodium, sugar, and alcohol. Tips for eating when  patients are short of breath are discussed.   Pursed Lip Breathing:  -Group instruction that is supported by demonstration and informational handouts. Instructor discusses the benefits of pursed lip and diaphragmatic breathing and detailed demonstration on how to preform both.     Oxygen Safety:  -Group instruction provided by PowerPoint, verbal discussion, and written material to support subject matter. There is an overview of "What is Oxygen" and "Why do we need it".  Instructor also reviews how to create a safe environment for oxygen use, the importance of using oxygen as prescribed, and the risks of noncompliance. There is a brief discussion on traveling with oxygen and resources the patient may utilize.   Oxygen Equipment:  -Group instruction provided by [facility] Staff utilizing handouts, written materials, and equipment demonstrations.   Signs and Symptoms:  -Group instruction provided by written material and verbal discussion to support subject matter. Warning signs and symptoms of infection, stroke, and heart attack are reviewed and when to call the physician/911 reinforced. Tips for preventing the spread of infection discussed.   Advanced Directives:  -Group instruction provided by verbal instruction and written material to support subject matter. Instructor reviews Advanced Directive laws and proper instruction for filling out document.   Pulmonary Video:  -Group video education that reviews the importance of medication and oxygen compliance, exercise, good nutrition, pulmonary hygiene, and pursed lip and diaphragmatic breathing for the pulmonary patient.   Exercise for the Pulmonary Patient:  -Group instruction that is supported by a PowerPoint presentation. Instructor discusses benefits of exercise, core components of exercise, frequency, duration, and intensity of an exercise routine, importance of utilizing pulse oximetry during exercise, safety while exercising, and  options of places to exercise outside of rehab.     Pulmonary Medications:  -Verbally interactive group education provided by instructor with focus on inhaled medications and proper administration.   Anatomy and Physiology of the Respiratory System and Intimacy:  -Group instruction provided by PowerPoint, verbal discussion, and written material to support subject matter. Instructor reviews respiratory cycle and anatomical components of the respiratory system and their functions. Instructor also reviews differences in obstructive and restrictive respiratory diseases with examples of each. Intimacy, Sex, and Sexuality differences are reviewed with a discussion on  how relationships can change when diagnosed with pulmonary disease. Common sexual concerns are reviewed.   PULMONARY REHAB OTHER RESPIRATORY from 06/30/2019 in [place]  Date  06/21/19  Educator  -- [Handout]      MD DAY -A group question and answer session with a medical doctor that allows participants to ask questions that relate to their pulmonary disease state.   OTHER EDUCATION -Group or individual verbal, written, or video instructions that support the educational goals of the pulmonary rehab program.   Holiday Eating Survival Tips:  -Group instruction provided by PowerPoint slides, verbal discussion, and written materials to support subject matter. The instructor gives patients tips, tricks, and techniques to help them not only survive but enjoy the holidays despite the onslaught of food that accompanies the holidays.   Knowledge Questionnaire Score: Knowledge Questionnaire Score - 06/13/19 1137      Knowledge Questionnaire Score   Pre Score  16/18       Core Components/Risk Factors/Patient Goals at Admission: Personal Goals and Risk Factors at Admission - 07/04/19 1340      Core Components/Risk Factors/Patient Goals on Admission   Improve shortness of breath with ADL's  Yes     Intervention  Provide education, individualized exercise plan and daily activity instruction to help decrease symptoms of SOB with activities of daily living.    Expected Outcomes  Short Term: Improve cardiorespiratory fitness to achieve a reduction of symptoms when performing ADLs;Long Term: Be able to perform more ADLs without symptoms or delay the onset of symptoms       Core Components/Risk Factors/Patient Goals Review:  Goals and Risk Factor Review    Row Name 07/04/19 1340             Core Components/Risk Factors/Patient Goals Review   Personal Goals Review  Develop more efficient breathing techniques such as purse lipped breathing and diaphragmatic breathing and practicing self-pacing with activity.;Increase knowledge of respiratory medications and ability to use respiratory devices properly.;Improve shortness of breath with ADL's       Review  Brent Holloway just started the program, has attended 4 exercise sessions, too early to have met any program goals.       Expected Outcomes  See admission goals.          Core Components/Risk Factors/Patient Goals at Discharge (Final Review):  Goals and Risk Factor Review - 07/04/19 1340      Core Components/Risk Factors/Patient Goals Review   Personal Goals Review  Develop more efficient breathing techniques such as purse lipped breathing and diaphragmatic breathing and practicing self-pacing with activity.;Increase knowledge of respiratory medications and ability to use respiratory devices properly.;Improve shortness of breath with ADL's    Review  Brent Holloway just started the program, has attended 4 exercise sessions, too early to have met any program goals.    Expected Outcomes  See admission goals.       ITP Comments:   Comments: ITP REVIEW Pt is making expected progress toward pulmonary rehab goals after completing 5 sessions. Recommend continued exercise, life style modification, education, and utilization of breathing techniques to increase  stamina and strength and decrease shortness of breath with exertion.

## 2019-07-05 NOTE — Progress Notes (Signed)
Subjective:   PATIENT ID: Brent Holloway GENDER: male DOB: 06/16/1956, MRN: [id number]   HPI  Chief Complaint  Patient presents with  . Follow-up    shortness of breath 3L continous, not at work, coughing non productive   Reason for Visit: Follow-up for shortness of breath  Brent Holloway is a 63 year-old former smoker with heterozygous factor V [place] on xarelto who presents for follow-up for ILD.  On our initial consult on 03/30/19, he presented with three month history of gradually worsening shortness of breath with exertion associated with productive cough.  Worsens with heat and humidity. Improves with rest and air conditioning.   On follow-up visit with our Pulmonary NP, PFTs demonstrated moderate restrictive defect with DLCO 50%. He has had prior CT in 2018 which demonstrated findings of mild subpleural reticulation associated with mild bronchiectasis, scattered cysts and multiple pulmonary nodules with the largest <316mm that were stable compared to CT four months prior. CT Chest was repeated for abnormal PFT and chest imaging with progressive fibrotic disease with diffuse subpleural involvement predominantly worse in the basilar regions with honeycombing and traction bronchiectasis.  Interval: Since our last visit on 05/17/19, he has started Pulmonary rehab and tolerating the program well. Overall, he feels his dyspnea has worsened. Associated with coughing.  He is compliant oxygen 24/7. He is to increase his oxygen to 4L when exercising which is more than we previously prescribed. Denies lower extremity or abdominal swelling. Denies wheezing. He is still awaiting approval for Esbriet. He also has several questions regarding transplant options today.  Social History: Previously smoked 4-5 cigars a day. Quit in 2018.  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  Past Medical History:  Diagnosis Date  . DVT (deep venous thrombosis) (HCC)   . Factor 5 Leiden mutation, heterozygous (HCC)      Family History  Problem Relation Age of Onset  . Heart disease Father   . Congestive Heart Failure Father   . Dementia Mother      Social History   Occupational History  . Not on file  Tobacco Use  . Smoking status: Former Smoker    Packs/day: 0.25    Years: 44.00    Pack years: 11.00    Types: Cigars    Start date: 291974    Quit date: 03/29/2017    Years since quitting: 2.2  . Smokeless tobacco: Never Used  . Tobacco comment: 5-6 cigars daily   Substance and Sexual Activity  . Alcohol use: Yes    Comment: occ  . Drug use: No  . Sexual activity: Not on file    No Known Allergies   Outpatient Medications Prior to Visit  Medication Sig Dispense Refill  . cetirizine (ZYRTEC) 10 MG tablet Take 10 mg by mouth daily.    . Omega-3 Fatty Acids (FISH OIL) 1000 MG CAPS Take 1 capsule by mouth daily.    Carlena Hurl. XARELTO 20 MG TABS tablet Take 20 mg by mouth daily.  2   No facility-administered medications prior to visit.    Review of Systems  Constitutional: Positive for malaise/fatigue. Negative for chills, diaphoresis, fever and weight loss.  HENT: Negative for congestion.   Respiratory: Positive for cough and shortness of breath. Negative for hemoptysis, sputum production and wheezing.  Cardiovascular: Negative for chest pain, palpitations and leg swelling.  Neurological: Negative for weakness.    Objective:   Vitals:   07/05/19 1052  BP: 138/88  Temp: (!) 97.2 F (36.2 C)  TempSrc: Temporal  SpO2: 98%  Weight: 275 lb 9.6 oz (125 kg)  Height: 5\' 11"  (1.803 m)   SpO2: 98 % O2 Device: Nasal cannula O2 Flow Rate (L/min): 3 L/min O2 Type: Continuous O2  Physical Exam: General: Pleasant  male, obese, no acute distress HENT: [place], AT Eyes: EOMI, no scleral icterus Respiratory: Bibasilar inspiratory rales Cardiovascular: RRR, -M/R/G, no JVD GI: BS+, soft, nontender Extremities:Mild RLE edema,,-tenderness Neuro: AAO x4, CNII-XII grossly intact Skin: Chronic RLE venous stasis changes Psych: Normal mood, normal affect  Data Reviewed:  Imaging: CT chest 07/08/2017 - Upper lobe predominant L>R subpleural reticulation with cystic-appearing lesions that may represent early honeycombing CT Chest 05/09/19 - Progressive subpleural reticulation associated with GGO with diffuse involvement predominantly worse in the basilar regions with honeycombing and traction bronchiectasis. RUL nodule also noted to be increased 46mm>>9mm compared to 07/09/19 imaging.  PFT: 04/15/19 FVC 3.29 (67%) FEV1 2.93 (80%) Ratio 87  TLC 61% DLCO 50% Interpretation: Mild restrictive defect with moderately reduced DLCO  Labs: ANA - neg HIV ab - nonreactive Anti-scleroderma ab - <0.2 Anti-ds DNA ab - 1 Aldolase - 7 CK - 129 ANCA screen - neg Anti-ribonucleic acid ab - 11.2 (neg) RF 13.1 SSA ab 0.5 SSB ab 0.2 CCP ab 6  Ambulatory O2 07/05/19 Patient Saturations on Room Air at Rest = 98% Patient Saturations on Room Air while Ambulating = 86% Patient Saturations on 3 Liters of oxygen while Ambulating = 92% CONCLUSION: Recommend 3L O2 with exertion and sleep  Home sleep study 06/13/19 Severe sleep apnea AHI 34.1. Recommend CPAP sleep titration study  Imaging, labs and test noted above have been reviewed independently by me.    Assessment & Plan:   Discussion: 63 year old male with hx RLE DVT secondary to heterozygous Factor V Leiden who presents for follow-up for progressive ILD suggestive of IPF/UIP. I suspect this may be occupation-related with his history as a 64 but will evaluate for autoimmune processes to complete work-up. In-clinic ambulatory sats demonstrate new O2 requirement  secondary to ILD. We extensively reviewed his chest imaging and PFTs. We discussed management as noted below.  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 Repeat PFT on 07/08/19 ordered and demonstrated ~8% decrease in FEV1 and ~14% decrease in DLCO. Consider bronchoscopy for further evaluation of etiology however will defer procedure pending need for tissue sampling based on PET/CT. Even with bronchoscopy, unclear benefit/additional information based on UIP pattern seen on HRCT. --Refer to [facility] for Lung Transplant evaluation --Continue Pulmonary Rehab --Continue enrollment for anti-fibrotic. If he does not qualify for Esbriet, will apply for Ofev. --Discussed weight loss with healthy heart diet and exercise. Patient has had previous success with weight loss programs and is willing to restart  Chronic hypoxemic respiratory failure secondary to interstitial lung disease and untreated OSA --Repeat 6 MWT --Wear 3L supplemental oxygen via [place] for goal greater than 88% with exertion and sleep. --Left phone message to address if patient would like to pursue CPAP titration. Otherwise, will plan to discuss at next visit  Right upper lobe pulmonary nodule Mediastinal and hilar lymphadenopathy --Will schedule PET/CT in 3 months (~08/08/18)  --Depending on your results, we will schedule either a bronchoscopy with  wash or with biopsy  Health Maintenance Immunization History  Administered Date(s) Administered  . Influenza Inj Mdck Quad Pf 03/23/2019   Orders Placed This Encounter  Procedures  . Ambulatory referral to Pulmonology    Referral Priority:   Routine    Referral Type:   Consultation    Referral Reason:   Specialty Services Required    Requested Specialty:   Pulmonary Disease    Number of Visits Requested:   1  . Pulmonary function test    Standing Status:   Future    Number of Occurrences:    1    Standing Expiration Date:   07/04/2020    Scheduling Instructions:     [facility] need for done    Order Specific Question:   Where should this test be performed?    Answer:    Pulmonary    Order Specific Question:   Full PFT: includes the following: basic spirometry, spirometry pre & post bronchodilator, diffusion capacity (DLCO), lung volumes    Answer:   Full PFT   No orders of the defined types were placed in this encounter.   Return in about 2 months (around 09/05/2019).  Bessie, MD [facility] 07/05/2019 11:24 AM  Office Number [phone]

## 2019-07-05 NOTE — Progress Notes (Signed)
Daily Session Note  Patient Details  Name: Brent Holloway MRN: [id number] Date of Birth: Mar 14, 1956 Referring Provider:     Pulmonary Rehab Walk Test from 06/13/2019 in [place]  Referring Provider  Dr. [name]      Encounter Date: 07/05/2019  Check In: Session Check In - 07/05/19 1446      Check-In   Supervising physician immediately available to respond to emergencies  [facility] immediately available    Physician(s)  Dr. Earnest Conroy    Location  [facility]    Staff Present  Rosebud Poles, RN, Bjorn Loser, MS, Exercise Physiologist;Lisa Ysidro Evert, RN    Virtual Visit  No    Medication changes reported      No    Fall or balance concerns reported     No    Tobacco Cessation  No Change    Warm-up and Cool-down  Performed on first and last piece of equipment    Resistance Training Performed  Yes    VAD Patient?  No    PAD/SET Patient?  No      Pain Assessment   Currently in Pain?  No/denies    Multiple Pain Sites  No       Capillary Blood Glucose: No results found for this or any previous visit (from the past 24 hour(s)).  Exercise Prescription Changes - 07/05/19 1400      Response to Exercise   Blood Pressure (Admit)  128/74    Blood Pressure (Exercise)  140/72    Blood Pressure (Exit)  128/70    Heart Rate (Admit)  91 bpm    Heart Rate (Exercise)  122 bpm    Heart Rate (Exit)  101 bpm    Oxygen Saturation (Admit)  98 %    Oxygen Saturation (Exercise)  89 %    Oxygen Saturation (Exit)  98 %    Rating of Perceived Exertion (Exercise)  12    Perceived Dyspnea (Exercise)  1    Duration  Continue with 30 min of aerobic exercise without signs/symptoms of physical distress.    Intensity  THRR unchanged      Progression   Progression  Continue to progress workloads to maintain intensity without signs/symptoms of physical distress.      Resistance Training   Training Prescription  Yes    Weight  blue bands     Reps  10-15    Time  10 Minutes      Oxygen   Oxygen  Continuous    Liters  4-8      Treadmill   MPH  1.6    Grade  1    Minutes  15      NuStep   Level  3    SPM  80    Minutes  15    METs  2.1       Social History   Tobacco Use  Smoking Status Former Smoker  . Packs/day: 0.25  . Years: 44.00  . Pack years: 11.00  . Types: Cigars  . Start date: 55  . Quit date: 03/29/2017  . Years since quitting: 2.2  Smokeless Tobacco Never Used  Tobacco Comment   5-6 cigars daily     Goals Met:  Independence with exercise equipment Exercise tolerated well Strength training completed today  Goals Unmet:  Not Applicable  Comments: Service time is from 1330 to 1440    Dr. Rush Farmer is Medical Director for Pulmonary  Rehab at [facility].

## 2019-07-05 NOTE — Patient Instructions (Signed)
Interstitial lung disease Continue enrollment for Esbriet Schedule for repeat pulmonary function test this Friday. This means you will need COVID testing TODAY to qualify. Refer to [facility] for evaluation for lung transplant  Chronic hypoxemic respiratory failure Arrange for 6MWT at pulmonary rehab today Continue supplemental oxygen for >88% at rest, exertion and sleep

## 2019-07-06 ENCOUNTER — Telehealth: Payer: Self-pay | Admitting: Pharmacist

## 2019-07-06 LAB — NOVEL CORONAVIRUS, NAA (HOSP ORDER, SEND-OUT TO REF LAB; TAT 18-24 HRS): SARS-CoV-2, NAA: NOT DETECTED

## 2019-07-06 NOTE — Telephone Encounter (Signed)
Received new start paperwork for Esbriet.  Will start benefits investigation.  He has Pharmacist, community and should be co-pay card eligible.   Mariella Saa, PharmD, [place], [place] Clinical Specialty Pharmacist [phone]  07/06/2019 5:09 PM

## 2019-07-07 ENCOUNTER — Encounter (HOSPITAL_COMMUNITY): Payer: Managed Care, Other (non HMO)

## 2019-07-07 NOTE — Telephone Encounter (Signed)
Submitted a Prior Authorization request to [facility] for [place] via Cover My Meds. Will update once we receive a response.  PA Case ID: [id number]   Mariella Saa, PharmD, [place], [place] Clinical Specialty Pharmacist [phone]  07/07/2019 8:52 AM

## 2019-07-08 ENCOUNTER — Ambulatory Visit (INDEPENDENT_AMBULATORY_CARE_PROVIDER_SITE_OTHER): Payer: Managed Care, Other (non HMO) | Admitting: Pulmonary Disease

## 2019-07-08 ENCOUNTER — Telehealth: Payer: Self-pay | Admitting: Pulmonary Disease

## 2019-07-08 ENCOUNTER — Other Ambulatory Visit: Payer: Self-pay

## 2019-07-08 DIAGNOSIS — J849 Interstitial pulmonary disease, unspecified: Secondary | ICD-10-CM

## 2019-07-08 LAB — PULMONARY FUNCTION TEST
DL/VA % pred: 69 %
DL/VA: 2.9 ml/min/mmHg/L
DLCO unc % pred: 43 %
DLCO unc: 12.19 ml/min/mmHg
FEF 25-75 Post: 3.89 L/sec
FEF 25-75 Pre: 3.51 L/sec
FEF2575-%Change-Post: 10 %
FEF2575-%Pred-Post: 133 %
FEF2575-%Pred-Pre: 120 %
FEV1-%Change-Post: 2 %
FEV1-%Pred-Post: 74 %
FEV1-%Pred-Pre: 72 %
FEV1-Post: 2.71 L
FEV1-Pre: 2.64 L
FEV1FVC-%Change-Post: 2 %
FEV1FVC-%Pred-Pre: 117 %
FEV6-%Change-Post: 0 %
FEV6-%Pred-Post: 65 %
FEV6-%Pred-Pre: 64 %
FEV6-Post: 3.01 L
FEV6-Pre: 2.99 L
FEV6FVC-%Pred-Post: 105 %
FEV6FVC-%Pred-Pre: 105 %
FVC-%Change-Post: 0 %
FVC-%Pred-Post: 62 %
FVC-%Pred-Pre: 61 %
FVC-Post: 3.01 L
FVC-Pre: 2.99 L
Post FEV1/FVC ratio: 90 %
Post FEV6/FVC ratio: 100 %
Pre FEV1/FVC ratio: 88 %
Pre FEV6/FVC Ratio: 100 %
RV % pred: 49 %
RV: 1.17 L
TLC % pred: 58 %
TLC: 4.19 L

## 2019-07-08 NOTE — Progress Notes (Signed)
PFT completed today.  

## 2019-07-08 NOTE — Telephone Encounter (Signed)
Received a fax regarding Prior Authorization from [facility] for [place]. Authorization has been DENIED because Patient's diagnosis is considered off-label use.  Pharmacy team will submit appeal.  Phone# [phone]   9:01 AM Beatriz Chancellor, CPhT

## 2019-07-08 NOTE — Telephone Encounter (Signed)
Lauren please make sure Dr. Hale Bogus receive the forms.

## 2019-07-11 NOTE — Telephone Encounter (Signed)
LMOM TCB x3 Per office protocol will close message and send letter to home address on file.

## 2019-07-11 NOTE — Telephone Encounter (Signed)
Dr. [name] has forms.  She has signed the needed forms. ATC patient unable to reach LM to call back off if needed, but forms were placed upfront for pick up.  Nothing further needed at this time.

## 2019-07-12 ENCOUNTER — Telehealth: Payer: Self-pay | Admitting: Pulmonary Disease

## 2019-07-12 ENCOUNTER — Encounter (HOSPITAL_COMMUNITY)
Admission: RE | Admit: 2019-07-12 | Discharge: 2019-07-12 | Disposition: A | Discharge status: Home / Self Care | Payer: Managed Care, Other (non HMO) | Source: Ambulatory Visit | Attending: Pulmonary Disease | Admitting: Pulmonary Disease

## 2019-07-12 ENCOUNTER — Other Ambulatory Visit: Payer: Self-pay

## 2019-07-12 DIAGNOSIS — J849 Interstitial pulmonary disease, unspecified: Secondary | ICD-10-CM | POA: Diagnosis not present

## 2019-07-12 DIAGNOSIS — G4733 Obstructive sleep apnea (adult) (pediatric): Secondary | ICD-10-CM | POA: Insufficient documentation

## 2019-07-12 DIAGNOSIS — J84112 Idiopathic pulmonary fibrosis: Secondary | ICD-10-CM | POA: Insufficient documentation

## 2019-07-12 DIAGNOSIS — J9611 Chronic respiratory failure with hypoxia: Secondary | ICD-10-CM | POA: Insufficient documentation

## 2019-07-12 DIAGNOSIS — E6609 Other obesity due to excess calories: Secondary | ICD-10-CM | POA: Insufficient documentation

## 2019-07-12 NOTE — Telephone Encounter (Signed)
atc patient to get him scheduled for a 6 minute walk and follow up with JE in feb 2021 unable to reach left message to call office back

## 2019-07-12 NOTE — Progress Notes (Signed)
Daily Session Note  Patient Details  Name: Brent Holloway MRN: [id number] Date of Birth: 1956/02/29 Referring Provider:     Pulmonary Rehab Walk Test from 06/13/2019 in [place]  Referring Provider  Dr. [name]      Encounter Date: 07/12/2019  Check In: Session Check In - 07/12/19 1410      Check-In   Supervising physician immediately available to respond to emergencies  [facility] immediately available    Physician(s)  Dr. [place] Crane    Location  [facility]    Staff Present  Rosebud Poles, RN, Bjorn Loser, MS, Exercise Physiologist;Lisa Ysidro Evert, RN    Virtual Visit  No    Medication changes reported      No    Fall or balance concerns reported     No    Tobacco Cessation  No Change    Warm-up and Cool-down  Performed on first and last piece of equipment    Resistance Training Performed  Yes    VAD Patient?  No    PAD/SET Patient?  No      Pain Assessment   Currently in Pain?  No/denies    Multiple Pain Sites  No       Capillary Blood Glucose: No results found for this or any previous visit (from the past 24 hour(s)).    Social History   Tobacco Use  Smoking Status Former Smoker  . Packs/day: 0.25  . Years: 44.00  . Pack years: 11.00  . Types: Cigars  . Start date: 72  . Quit date: 03/29/2017  . Years since quitting: 2.2  Smokeless Tobacco Never Used  Tobacco Comment   5-6 cigars daily     Goals Met:  Independence with exercise equipment Exercise tolerated well Strength training completed today  Goals Unmet:  Not Applicable  Comments: Service time is from 1320 to 1420    Dr. Rush Farmer is Medical Director for Pulmonary Rehab at [facility].

## 2019-07-13 ENCOUNTER — Encounter: Payer: Self-pay | Admitting: Pharmacist

## 2019-07-13 NOTE — Telephone Encounter (Signed)
Left message for patient to call back  

## 2019-07-13 NOTE — Progress Notes (Signed)
Appeal Letter for [employer] faxed to [employer] # [phone]

## 2019-07-13 NOTE — Telephone Encounter (Signed)
Pt states that he had a 6 minute walk with his [facility] last Tuesday - week ago - does he still need to make an appt with [place] for the walk?   Also - pt has a question regarding Esbriet - please call pt - CB# [phone]

## 2019-07-13 NOTE — Telephone Encounter (Signed)
Appeal Letter for [employer] faxed to [employer].  Will update when we receive a response.  Fax # [phone]  Mariella Saa, PharmD, [place], [place] Clinical Specialty Pharmacist [phone]  07/13/2019 11:07 AM

## 2019-07-14 ENCOUNTER — Encounter (HOSPITAL_COMMUNITY)
Admission: RE | Admit: 2019-07-14 | Discharge: 2019-07-14 | Disposition: A | Discharge status: Home / Self Care | Payer: Managed Care, Other (non HMO) | Source: Ambulatory Visit | Attending: Pulmonary Disease | Admitting: Pulmonary Disease

## 2019-07-14 ENCOUNTER — Other Ambulatory Visit: Payer: Self-pay

## 2019-07-14 DIAGNOSIS — J849 Interstitial pulmonary disease, unspecified: Secondary | ICD-10-CM | POA: Diagnosis not present

## 2019-07-14 NOTE — Progress Notes (Signed)
Daily Session Note  Patient Details  Name: Brent Holloway MRN: [id number] Date of Birth: [birth year] Referring Provider:     Pulmonary Rehab Walk Test from 06/13/2019 in [place]  Referring Provider  Dr. [name]      Encounter Date: 07/14/2019  Check In: Session Check In - 07/14/19 1031      Check-In   Supervising physician immediately available to respond to emergencies  [facility] immediately available    Physician(s)  Dr. Avon Gully    Location  [facility]    Staff Present  Rosebud Poles, RN, Bjorn Loser, MS, Exercise Physiologist;Lisa Ysidro Evert, RN    Virtual Visit  No    Medication changes reported      No    Fall or balance concerns reported     No    Tobacco Cessation  No Change    Warm-up and Cool-down  Performed on first and last piece of equipment    Resistance Training Performed  Yes    VAD Patient?  No    PAD/SET Patient?  No      Pain Assessment   Currently in Pain?  No/denies    Multiple Pain Sites  No       Capillary Blood Glucose: No results found for this or any previous visit (from the past 24 hour(s)).    Social History   Tobacco Use  Smoking Status Former Smoker  . Packs/day: 0.25  . Years: 44.00  . Pack years: 11.00  . Types: Cigars  . Start date: 13  . Quit date: 03/29/2017  . Years since quitting: 2.2  Smokeless Tobacco Never Used  Tobacco Comment   5-6 cigars daily     Goals Met:  Independence with exercise equipment Exercise tolerated well Strength training completed today  Goals Unmet:  Not Applicable  Comments: Service time is from 0950 to 1050    Dr. Rush Farmer is Medical Director for Pulmonary Rehab at [facility].

## 2019-07-18 ENCOUNTER — Telehealth: Payer: Self-pay | Admitting: Pulmonary Disease

## 2019-07-18 DIAGNOSIS — J9611 Chronic respiratory failure with hypoxia: Secondary | ICD-10-CM

## 2019-07-18 NOTE — Telephone Encounter (Signed)
RX has been printed. Will stamp JE's signature and fax to [employer].

## 2019-07-18 NOTE — Telephone Encounter (Signed)
Spoke with patient. Advised him that we had received a request from [facility] to have a RX for O2 to be sent to them. He stated that he has been working with them and it is ok to send the order for O2.   Dr. [name], please advise if you are ok with [place] ordering O2 from [facility]. Thanks!

## 2019-07-18 NOTE — Telephone Encounter (Signed)
OK to order O2 at patient's requested site. -JE

## 2019-07-19 ENCOUNTER — Encounter (HOSPITAL_COMMUNITY)
Admission: RE | Admit: 2019-07-19 | Discharge: 2019-07-19 | Disposition: A | Discharge status: Home / Self Care | Payer: Managed Care, Other (non HMO) | Source: Ambulatory Visit | Attending: Pulmonary Disease | Admitting: Pulmonary Disease

## 2019-07-19 ENCOUNTER — Telehealth: Payer: Self-pay | Admitting: Pulmonary Disease

## 2019-07-19 ENCOUNTER — Other Ambulatory Visit: Payer: Self-pay

## 2019-07-19 DIAGNOSIS — J849 Interstitial pulmonary disease, unspecified: Secondary | ICD-10-CM | POA: Diagnosis not present

## 2019-07-19 NOTE — Progress Notes (Signed)
Daily Session Note  Patient Details  Name: Brent Holloway MRN: [id number] Date of Birth: 01/11/1956 Referring Provider:     Pulmonary Rehab Walk Test from 06/13/2019 in [facility]  Referring Provider  Dr. [name]      Encounter Date: 07/19/2019  Check In: Session Check In - 07/19/19 1514      Check-In   Supervising physician immediately available to respond to emergencies  [facility] immediately available    Physician(s)  Dr. Nevada Crane    Location  [facility]    Staff Present  Rosebud Poles, RN, Bjorn Loser, MS, Exercise Physiologist;Francisco Ostrovsky Ysidro Evert, RN    Virtual Visit  No    Medication changes reported      No    Fall or balance concerns reported     No    Tobacco Cessation  No Change    Warm-up and Cool-down  Performed on first and last piece of equipment    Resistance Training Performed  Yes    VAD Patient?  No    PAD/SET Patient?  No      Pain Assessment   Currently in Pain?  No/denies       Capillary Blood Glucose: No results found for this or any previous visit (from the past 24 hour(s)).  Exercise Prescription Changes - 07/19/19 1500      Response to Exercise   Blood Pressure (Admit)  120/70    Blood Pressure (Exercise)  150/70    Blood Pressure (Exit)  114/80    Heart Rate (Admit)  80 bpm    Heart Rate (Exercise)  121 bpm    Heart Rate (Exit)  89 bpm    Oxygen Saturation (Admit)  100 %    Oxygen Saturation (Exercise)  84 %   On treadmill sat dropped to 84% increased O2 to 10 L sat 88%   Oxygen Saturation (Exit)  99 %    Rating of Perceived Exertion (Exercise)  11    Perceived Dyspnea (Exercise)  2.5    Duration  Continue with 30 min of aerobic exercise without signs/symptoms of physical distress.    Intensity  THRR unchanged      Progression   Progression  Continue to follow PAD protocol      Resistance Training   Training Prescription  Yes    Weight  blue bands    Reps  10-15    Time  10  Minutes      Oxygen   Oxygen  Continuous    Liters  4-10      Treadmill   MPH  1.6    Grade  1    Minutes  15      NuStep   Level  3    SPM  80    Minutes  15    METs  2.1       Social History   Tobacco Use  Smoking Status Former Smoker  . Packs/day: 0.25  . Years: 44.00  . Pack years: 11.00  . Types: Cigars  . Start date: 1  . Quit date: 03/29/2017  . Years since quitting: 2.3  Smokeless Tobacco Never Used  Tobacco Comment   5-6 cigars daily     Goals Met:  Exercise tolerated well No report of cardiac concerns or symptoms Strength training completed today  Goals Unmet:  Not Applicable  Comments: Service time is from 1330 to 1441    Dr. Rush Farmer is Medical Director  for Pulmonary Rehab at [facility].

## 2019-07-19 NOTE — Telephone Encounter (Signed)
I called Merrilee Seashore and he stated that he needed something stating the patient was on oxygen. I faxed him the last office note and the snapshot with the qualifying walk. Nothing further is needed.

## 2019-07-20 ENCOUNTER — Telehealth: Payer: Self-pay | Admitting: Pulmonary Disease

## 2019-07-20 NOTE — Telephone Encounter (Signed)
Called and spoke with Brent Holloway, advised this is the 4th time the notes have been faxed. Confirmed fax number provided as correct.  Fax resent, confirmation received. Called and spoke with Brent Holloway directly to advise fax confirmation was received. Brent Holloway confirmed the faxed office notes were received. Nothing further needed at this time.

## 2019-07-20 NOTE — Telephone Encounter (Signed)
Patient had recent appointment, papers were signed and given to the pharmacy team   Nothing further needed at this time.

## 2019-07-21 ENCOUNTER — Other Ambulatory Visit: Payer: Self-pay

## 2019-07-21 ENCOUNTER — Encounter (HOSPITAL_COMMUNITY)
Admission: RE | Admit: 2019-07-21 | Discharge: 2019-07-21 | Disposition: A | Discharge status: Home / Self Care | Payer: Managed Care, Other (non HMO) | Source: Ambulatory Visit | Attending: Pulmonary Disease | Admitting: Pulmonary Disease

## 2019-07-21 DIAGNOSIS — J849 Interstitial pulmonary disease, unspecified: Secondary | ICD-10-CM | POA: Diagnosis not present

## 2019-07-21 NOTE — Progress Notes (Signed)
Daily Session Note  Patient Details  Name: Brent Holloway MRN: [id number] Date of Birth: 04/14/1956 Referring Provider:     Pulmonary Rehab Walk Test from 06/13/2019 in [facility]  Referring Provider  Dr. Ellison      Encounter Date: 07/21/2019  Check In: Session Check In - 07/21/19 1421      Check-In   Supervising physician immediately available to respond to emergencies  [facility] immediately available    Physician(s)  Dr. Hall    Location  [facility]    Staff Present  Joan Behrens, RN, BSN;Dalton Fletcher, MS, Exercise Physiologist;Lisa Hughes, RN    Virtual Visit  No    Medication changes reported      No    Fall or balance concerns reported     No    Tobacco Cessation  No Change    Warm-up and Cool-down  Performed on first and last piece of equipment    Resistance Training Performed  Yes    VAD Patient?  No    PAD/SET Patient?  No      Pain Assessment   Currently in Pain?  No/denies    Multiple Pain Sites  No       Capillary Blood Glucose: No results found for this or any previous visit (from the past 24 hour(s)).    Social History   Tobacco Use  Smoking Status Former Smoker  . Packs/day: 0.25  . Years: 44.00  . Pack years: 11.00  . Types: Cigars  . Start date: 1974  . Quit date: 03/29/2017  . Years since quitting: 2.3  Smokeless Tobacco Never Used  Tobacco Comment   5-6 cigars daily     Goals Met:  Exercise tolerated well No report of cardiac concerns or symptoms Strength training completed today  Goals Unmet:  Not Applicable  Comments: Service time is from 1335 to 1440    Dr. Wesam G. Yacoub is Medical Director for Pulmonary Rehab at [facility]. 

## 2019-07-25 ENCOUNTER — Telehealth: Payer: Self-pay | Admitting: Pulmonary Disease

## 2019-07-25 NOTE — Telephone Encounter (Signed)
Called number provided. No one answered. Did not leave a VM since no name was attached to the VM. Will call back later.

## 2019-07-26 ENCOUNTER — Encounter (HOSPITAL_COMMUNITY): Payer: Managed Care, Other (non HMO)

## 2019-07-26 NOTE — Telephone Encounter (Signed)
I called number provided but an operator requested to leave a numeric message and then it went to a busy signal. Will try again later.  

## 2019-07-27 NOTE — Telephone Encounter (Signed)
Called [employer]  regarding Appeal for Esbriet. Authorization has been DENIED because based on information submitted it is still deemed not medically necessary. Requested a refax of Appeal denial to office and to be attention to the pharmacy team. Also, it appears the office has been attempting to initiate a peer to peer with no success.  Will send document to scan center.  Phone# [phone]

## 2019-07-27 NOTE — Telephone Encounter (Signed)
WIll close encounter due to 3 attempts to contact without success. Per triage protocal.

## 2019-07-27 NOTE — Telephone Encounter (Signed)
I called number provided but an operator requested to leave a numeric message and then it went to a busy signal. Will try again later.

## 2019-07-28 ENCOUNTER — Encounter (HOSPITAL_COMMUNITY): Payer: Managed Care, Other (non HMO)

## 2019-07-28 NOTE — Telephone Encounter (Signed)
Appeal denial has been received and placed in yellow pharmacy folder in pharmacy office.    Routing back to pharmacy team to follow up on.

## 2019-08-02 ENCOUNTER — Encounter (HOSPITAL_COMMUNITY): Payer: Managed Care, Other (non HMO)

## 2019-08-04 ENCOUNTER — Encounter (HOSPITAL_COMMUNITY)
Admission: RE | Admit: 2019-08-04 | Discharge: 2019-08-04 | Disposition: A | Discharge status: Home / Self Care | Payer: Managed Care, Other (non HMO) | Source: Ambulatory Visit | Attending: Pulmonary Disease | Admitting: Pulmonary Disease

## 2019-08-04 ENCOUNTER — Other Ambulatory Visit: Payer: Self-pay

## 2019-08-04 ENCOUNTER — Telehealth (HOSPITAL_COMMUNITY): Payer: Self-pay | Admitting: *Deleted

## 2019-08-04 ENCOUNTER — Telehealth: Payer: Self-pay | Admitting: Pulmonary Disease

## 2019-08-04 ENCOUNTER — Encounter (HOSPITAL_COMMUNITY): Payer: Managed Care, Other (non HMO)

## 2019-08-04 DIAGNOSIS — Z87891 Personal history of nicotine dependence: Secondary | ICD-10-CM | POA: Insufficient documentation

## 2019-08-04 DIAGNOSIS — J849 Interstitial pulmonary disease, unspecified: Secondary | ICD-10-CM

## 2019-08-04 DIAGNOSIS — Z20828 Contact with and (suspected) exposure to other viral communicable diseases: Secondary | ICD-10-CM | POA: Insufficient documentation

## 2019-08-04 NOTE — Telephone Encounter (Signed)
ATC to call Brent Holloway back but the rehab was closed for the day. Will need to call back tomorrow morning.

## 2019-08-04 NOTE — Telephone Encounter (Signed)
Called and talked to Selena Batten at Dr. George Hugh office re: patient needing a home concentrator regulator that goes up to 10 liter/min. so that he can exercise on the treadmill while using the home concentrator instead of using up many e-cylinder tanks.

## 2019-08-04 NOTE — Telephone Encounter (Signed)
Called to check on patient, he was in pulmonary rehab before we suspended patients coming into exercise.  He is exercising on the treadmill 2 times/week at a speed of 1.5 mph, 0 incline.  Encouraged him to start increasing the speed of the treadmill gradually, for example increase to 1.7 mph and then in a few days increase to 1% incline.  He is requiring 10 liters of continuous oxygen to exercise, but his home concentrator regulator only goes to 5 L/min.  He is using several e-tanks while exercising.  I will call Dr. George Hugh office to request a higher liter flow regulator for him so he can walk on his treadmill while using his home concentrator.  Encouraged to exercise 3 days per week.  Plan:  Contact Dr. George Hugh office for higher regulator, continue to monitor weekly via phone.

## 2019-08-05 ENCOUNTER — Telehealth: Payer: Self-pay | Admitting: Pharmacist

## 2019-08-05 NOTE — Telephone Encounter (Signed)
Received notification from [facility] regarding a prior authorization for OFEV. Authorization has been APPROVED from 08/05/19 to 08/04/20.   Will send document to scan center.  Authorization # [id number] Phone # [phone]  Per plan, patient must use [facility]. Patient has a Nurse, learning disability, so he can enroll for a copay card.  11:33 AM Dorthula Nettles, CPhT

## 2019-08-05 NOTE — Telephone Encounter (Signed)
LMTCB x1 for Pulmonary Rehab.

## 2019-08-05 NOTE — Telephone Encounter (Signed)
Approval initiated for Ofev in another encounter.  Will send denial letter to the scan center.   Verlin Fester, PharmD, [place], CPP Clinical Specialty Pharmacist [phone]  08/05/2019 1:10 PM

## 2019-08-05 NOTE — Telephone Encounter (Signed)
Received fax from [employer] notifying of final appeal denial for Esbriet.  Also noticed in previous telephone encounter attempted to do a peer to peer but were unable to connect with insurance company.  Moving forward with approval for Ofev.  Submitted a Prior Authorization request to [facility] for OFEV via Cover My Meds. Will update once we receive a response.  Reference #  [id number]  Verlin Fester, PharmD, [place], CPP Clinical Specialty Pharmacist [phone]  08/05/2019 10:45 AM

## 2019-08-08 NOTE — Telephone Encounter (Signed)
Order has been placed for oxygen regulator.  [facility] - can you check on the referral to [facility]?

## 2019-08-08 NOTE — Telephone Encounter (Signed)
Yes, ok to send order for regulator up to 15L. Has the patient gotten an appointment with [facility] yet?

## 2019-08-08 NOTE — Telephone Encounter (Signed)
Called [facility] and had to leave vm asking for status of referral.  Their hours are 8-4:30 and they had already closed for the day.

## 2019-08-08 NOTE — Telephone Encounter (Signed)
I called Brent Holloway at Pulmonary rehab and she stated that the patients are doing exercise from home since Covid 19 pandemic and the pt needed a regulator that goes up to 10-15 liters. He requires at least 10L when he is on the treadmill. She would like an order sent to [facility] for a regulator for his home concentrator so he can continue to exercise on the treadmill. Dr. Everardo All please advise if we can send order.

## 2019-08-08 NOTE — Telephone Encounter (Signed)
Patient scheduled for pharmacy visit via the phone to discuss starting Ofev on 08/10/2019.  He has a follow up appointment in office on 08/11/2019.   Verlin Fester, PharmD, [place], CPP Clinical Specialty Pharmacist [phone]  08/08/2019 4:30 PM

## 2019-08-09 ENCOUNTER — Other Ambulatory Visit: Payer: Self-pay

## 2019-08-09 ENCOUNTER — Telehealth (HOSPITAL_COMMUNITY): Payer: Self-pay | Admitting: *Deleted

## 2019-08-09 ENCOUNTER — Encounter (HOSPITAL_COMMUNITY): Payer: Managed Care, Other (non HMO)

## 2019-08-09 ENCOUNTER — Telehealth: Payer: Self-pay | Admitting: Pulmonary Disease

## 2019-08-09 ENCOUNTER — Encounter (HOSPITAL_COMMUNITY)
Admission: RE | Admit: 2019-08-09 | Discharge: 2019-08-09 | Disposition: A | Discharge status: Home / Self Care | Payer: Managed Care, Other (non HMO) | Source: Ambulatory Visit | Attending: Pulmonary Disease | Admitting: Pulmonary Disease

## 2019-08-09 DIAGNOSIS — R911 Solitary pulmonary nodule: Secondary | ICD-10-CM | POA: Diagnosis not present

## 2019-08-09 DIAGNOSIS — J849 Interstitial pulmonary disease, unspecified: Secondary | ICD-10-CM

## 2019-08-09 LAB — GLUCOSE, CAPILLARY: Glucose-Capillary: 96 mg/dL (ref 70–99)

## 2019-08-09 MED ORDER — FLUDEOXYGLUCOSE F - 18 (FDG) INJECTION
14.7400 | Freq: Once | INTRAVENOUS | Status: AC | PRN
  Administered 2019-08-09: 14.74 via INTRAVENOUS

## 2019-08-09 NOTE — Telephone Encounter (Signed)
Kandee Keen called me back.  He states per notes Rosann Auerbach states they are going to assign a case Production designer, theatre/television/film.  He is going to send a message to finance dept to check on status.  He will call me when he finds out something.  Cory's direct # is [phone].Marland Kitchen

## 2019-08-09 NOTE — Telephone Encounter (Signed)
Called patient to inform him that Dr. George Hugh office is ordering a home concentrator that goes up to 10-15 liters/min and that [facility] should be contacting him soon.

## 2019-08-09 NOTE — Telephone Encounter (Signed)
Called [facility] & spoke to [place].  She states pt has not been scheduled yet and looks like it is still pending financial approval.  She transferred me to Delbert Harness - program specialist.  I had to leave a vm for him to call me back.

## 2019-08-09 NOTE — Telephone Encounter (Signed)
I searched through patient's chart and I could not find any reason or documentation of anyone had called him. I called and spoke with him and made him aware that I was sorry but could not find a reason why someone called from this office. He was ok with it. Nothing further is needed.

## 2019-08-09 NOTE — Telephone Encounter (Signed)
New order has been placed. Nothing further needed.  

## 2019-08-10 ENCOUNTER — Telehealth: Payer: Self-pay | Admitting: Pulmonary Disease

## 2019-08-10 ENCOUNTER — Telehealth (INDEPENDENT_AMBULATORY_CARE_PROVIDER_SITE_OTHER): Payer: Managed Care, Other (non HMO) | Admitting: Pharmacist

## 2019-08-10 DIAGNOSIS — J849 Interstitial pulmonary disease, unspecified: Secondary | ICD-10-CM

## 2019-08-10 DIAGNOSIS — J84112 Idiopathic pulmonary fibrosis: Secondary | ICD-10-CM

## 2019-08-10 MED ORDER — OFEV 150 MG PO CAPS: 150.0000 mg | ORAL_CAPSULE | Freq: Two times a day (BID) | ORAL | 2 refills | Status: DC

## 2019-08-10 NOTE — Progress Notes (Signed)
Subjective:  Patient connected via phone pharmacy team for Ofev counseling.  Pertinent past medical history includes ILD, history of tobacco abuse, heterozygous factor V Leyden, and history of DVT. He is naive to anti-fibrotic therapy.  Objective: No Known Allergies  Outpatient Encounter Medications as of 08/10/2019  Medication Sig  . cetirizine (ZYRTEC) 10 MG tablet Take 10 mg by mouth daily.  . Omega-3 Fatty Acids (FISH OIL) 1000 MG CAPS Take 1 capsule by mouth daily.  Alveda Reasons 20 MG TABS tablet Take 20 mg by mouth daily.   No facility-administered encounter medications on file as of 08/10/2019.     Immunization History  Administered Date(s) Administered  . 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 Substantial disease progression since 07/08/2017 chest CT. Findings are consistent with UIP per consensus guidelines: Diagnosis of Idiopathic Pulmonary Fibrosis: An Official ATS/ERS/JRS/ALAT Clinical Practice Guideline. [place], Iss 5, [phone], Mar 21 2017.   PFT's TLC  Date Value Ref Range Status  07/08/2019 4.19 L Preliminary     CMP     Component Value Date/Time   NA 140 07/06/2017 0845   K 5.1 07/06/2017 0845   CL 106 12/26/2016 2202   CO2 27 07/06/2017 0845   GLUCOSE 87 07/06/2017 0845   BUN 15.1 07/06/2017 0845   CREATININE 1.0 07/06/2017 0845   CALCIUM 9.7 07/06/2017 0845   PROT 6.8 07/06/2017 0845   ALBUMIN 3.7 07/06/2017 0845   AST 14 07/06/2017 0845   ALT 18 07/06/2017 0845   ALKPHOS 75 07/06/2017 0845   BILITOT 0.68 07/06/2017 0845   GFRNONAA >60 12/26/2016 2202   GFRAA >60 12/26/2016 2202     CBC    Component Value Date/Time   WBC 7.5 07/06/2017 0845   WBC 8.5 12/26/2016 2202   RBC 4.85 07/06/2017 0845   RBC 5.04 12/26/2016 2202   HGB 14.0 07/06/2017 0845   HCT 40.3 07/06/2017 0845   PLT 193 07/06/2017 0845   MCV 83.1 07/06/2017 0845   MCH 28.9 07/06/2017 0845   MCH 29.4 12/26/2016 2202   MCHC 34.8 07/06/2017 0845   MCHC 35.2 12/26/2016 2202   RDW 13.9 07/06/2017 0845   LYMPHSABS 1.1 07/06/2017 0845   MONOABS 0.8 07/06/2017 0845   EOSABS 0.2 07/06/2017 0845   BASOSABS 0.1 07/06/2017 0845     LFT's Hepatic Function Latest Ref Rng & Units 07/06/2017 02/02/2017  Total Protein 6.4 - 8.3 g/dL 6.8 6.7  Albumin 3.5 - 5.0 g/dL 3.7 4.5  AST 5 - 34 U/L 14 14  ALT 0 - 55 U/L 18 12  Alk Phosphatase 40 - 150 U/L 75 71  Total Bilirubin 0.20 - 1.20 mg/dL 0.68 0.4  Bilirubin, Direct 0.00 - 0.40 mg/dL - 0.12     Assessment and Plan  1. Ofev New Start   Patient counseled on purpose, proper use, and potential adverse effects including diarrhea, nausea, vomiting, abdominal pain, decreased appetite, weight loss, and increased blood pressure.  Stressed the importance of lab monitoring every month the first 6 months of therapy and then every 3 months thereafter.  Ofev dose will be 150 mg capsule every 12 hours with food. Stressed importance of taking with food to minimize stomach upset.  He must fill through [facility] per insurance.  He has Pharmacist, community and is eligible for co-pay assistance.  Patient given number  to [facility] to enroll in co-pay assistance.  2. Medication Reconciliation  A drug regimen assessment was performed, including review of allergies, interactions, disease-state management, dosing and immunization history. Medications were reviewed with the patient, including name, instructions, indication, goals of therapy, potential side effects, importance of adherence, and safe use.    He is currently on Xarelto and taken in combination with Ofev can increase bleeding risk.  This interaction and warning is based on mechanism of action of Ofev but not seen in clinical practice.  Counseled patient to montior for signs/symptoms of bleeding such  as easy bruising, nosebleeds, gums bleeding when you brush your teeth and more bleeding than normal from small cut. Reviewed signs and symptoms of major bleeding including  Red or dark brown urine, red or black tarry stool,vomiting or coughing up blood, bruises that appear for no known reason, frequent nosebleeds, bleeding gums, or unusual bleeding and any bleeding that does not stop or is very heavy.  Patient verbalized understanding.  3. Immunizations  Patient is up-to-date on annual influenza vaccine.  Recommend Pneumovax 23 and Shingrix vaccine based on age and comorbidities.  All questions encouraged and answered.  Instructed patient to call with any questions or concerns.  This appointment required  30 minutes of patient care (this includes precharting, chart review, review of results, face-to-face care, etc.).   Mariella Saa, PharmD, [place], [place] Clinical Specialty Pharmacist [phone]  08/10/2019 3:17 PM

## 2019-08-10 NOTE — Telephone Encounter (Signed)
[facility] is currently closed  Will call tomorrow 1/21

## 2019-08-11 ENCOUNTER — Telehealth: Payer: Self-pay | Admitting: Pharmacist

## 2019-08-11 ENCOUNTER — Telehealth: Payer: Self-pay | Admitting: Pulmonary Disease

## 2019-08-11 ENCOUNTER — Encounter (HOSPITAL_COMMUNITY): Payer: Managed Care, Other (non HMO)

## 2019-08-11 ENCOUNTER — Ambulatory Visit: Payer: Managed Care, Other (non HMO) | Admitting: Pulmonary Disease

## 2019-08-11 ENCOUNTER — Encounter: Payer: Self-pay | Admitting: Pulmonary Disease

## 2019-08-11 ENCOUNTER — Other Ambulatory Visit: Payer: Self-pay

## 2019-08-11 VITALS — HR 80 | Temp 97.3°F | Ht 70.0 in | Wt 282.4 lb

## 2019-08-11 DIAGNOSIS — R911 Solitary pulmonary nodule: Secondary | ICD-10-CM

## 2019-08-11 DIAGNOSIS — J9611 Chronic respiratory failure with hypoxia: Secondary | ICD-10-CM

## 2019-08-11 DIAGNOSIS — J84112 Idiopathic pulmonary fibrosis: Secondary | ICD-10-CM

## 2019-08-11 DIAGNOSIS — R918 Other nonspecific abnormal finding of lung field: Secondary | ICD-10-CM | POA: Diagnosis not present

## 2019-08-11 LAB — COMPREHENSIVE METABOLIC PANEL
ALT: 19 U/L (ref 0–53)
AST: 22 U/L (ref 0–37)
Albumin: 4.4 g/dL (ref 3.5–5.2)
Alkaline Phosphatase: 63 U/L (ref 39–117)
BUN: 19 mg/dL (ref 6–23)
CO2: 29 mEq/L (ref 19–32)
Calcium: 10.6 mg/dL — ABNORMAL HIGH (ref 8.4–10.5)
Chloride: 103 mEq/L (ref 96–112)
Creatinine, Ser: 0.97 mg/dL (ref 0.40–1.50)
GFR: 77.95 mL/min (ref >60.00)
Glucose, Bld: 97 mg/dL (ref 70–99)
Potassium: 4.7 mEq/L (ref 3.5–5.1)
Sodium: 137 mEq/L (ref 135–145)
Total Bilirubin: 0.7 mg/dL (ref 0.2–1.2)
Total Protein: 7.1 g/dL (ref 6.0–8.3)

## 2019-08-11 LAB — CBC WITH DIFFERENTIAL/PLATELET
Basophils Absolute: 0.1 10*3/uL (ref 0.0–0.1)
Basophils Relative: 1.3 % (ref 0.0–3.0)
Eosinophils Absolute: 0.2 10*3/uL (ref 0.0–0.7)
Eosinophils Relative: 2.8 % (ref 0.0–5.0)
HCT: 42.2 % (ref 39.0–52.0)
Hemoglobin: 14.8 g/dL (ref 13.0–17.0)
Lymphocytes Relative: 17 % (ref 12.0–46.0)
Lymphs Abs: 1.3 10*3/uL (ref 0.7–4.0)
MCHC: 35.1 g/dL (ref 30.0–36.0)
MCV: 84.3 fl (ref 78.0–100.0)
Monocytes Absolute: 1 10*3/uL (ref 0.1–1.0)
Monocytes Relative: 13 % — ABNORMAL HIGH (ref 3.0–12.0)
Neutro Abs: 4.9 10*3/uL (ref 1.4–7.7)
Neutrophils Relative %: 65.9 % (ref 43.0–77.0)
Platelets: 236 10*3/uL (ref 150.0–400.0)
RBC: 5 Mil/uL (ref 4.22–5.81)
RDW: 14 % (ref 11.5–15.5)
WBC: 7.4 10*3/uL (ref 4.0–10.5)

## 2019-08-11 MED ORDER — OMEPRAZOLE 20 MG PO CPDR: 20.0000 mg | DELAYED_RELEASE_CAPSULE | Freq: Every day | ORAL | 6 refills | Status: DC

## 2019-08-11 MED ORDER — BREO ELLIPTA 200-25 MCG/INH IN AEPB: 1.0000 | INHALATION_SPRAY | Freq: Every day | RESPIRATORY_TRACT | 0 refills | Status: DC

## 2019-08-11 MED ORDER — PREDNISONE 10 MG PO TABS: ORAL_TABLET | ORAL | 0 refills | Status: DC

## 2019-08-11 NOTE — Addendum Note (Signed)
Addended by: Mechele Collin on: 08/11/2019 09:45 PM   Modules accepted: Orders

## 2019-08-11 NOTE — Telephone Encounter (Signed)
Pt's visit today with Dr. Everardo All is complete. [facility] note was update and new order placed.   Will forward to PCCs to send to [facility].

## 2019-08-11 NOTE — Telephone Encounter (Signed)
Findings of benefits investigation via test claims at [facility]:  Insurance: [employer]   Symbicort - # 1 inhaler for a 1 month supply through patient's insurance is $ 0.00.  Breo Ellipta - # 1 inhaler for a 1 month supply through patient's insurance is $ 0.00.  Dulera - Nonformulary on pt's plan  Qvar - Nonformulary on pt's plan  Patient is only allowed 3 retail fills, then he must use [facility] or [facility] mail service.  11:24 AM Dorthula Nettles, CPhT

## 2019-08-11 NOTE — Telephone Encounter (Signed)
Called and spoke to [place] with [facility] and was advised pt needs a new order changing him from [facility] to [facility], along with qualifying sats. Pt has an appt with Dr. Everardo All today. Will be sure this is done today while in office so order can be sent to DME. Will hold in triage till complete.

## 2019-08-11 NOTE — Telephone Encounter (Signed)
Will sign off on this message as there is already an open message from 1.14.21.

## 2019-08-11 NOTE — Telephone Encounter (Signed)
Haven't heard back from [facility] yet.  Pt has called to check status and another message was created.  I called & left vm for Cory to check status.  Called pt & made him aware of what I have been told and I will call him back when I hear from [facility].

## 2019-08-11 NOTE — Telephone Encounter (Signed)
There is an open message on this from 1/14.  I called [facility] on 1/19 to check status -      Brent Holloway called me back.  He states per notes Brent Holloway states they are going to assign a case Production designer, theatre/television/film.  He is going to send a message to finance dept to check on status.  He will call me when he finds out something.  Brent Holloway's direct # is [phone]..     -I called Brent Holloway back today & left vm asking for status.  Had to leave a vm.  Called pt & made him aware of what I was told on 1/19 and that I would let him know when I hear back from [facility].

## 2019-08-11 NOTE — Telephone Encounter (Signed)
Pt was referred to [facility] for lung transplant eval, this order was placed on 07/05/19 and pt has not heard anything. Pt is questioning when he will be contacted.   PCC's can you help?

## 2019-08-11 NOTE — Patient Instructions (Signed)
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 Will need CBC/CMP every month for the 1st 6 months, followed by q3 months. --START omeprazole 20 mg daily --Provided Breo 200 sample. One inhalation daily  Chronic hypoxemic respiratory failure secondary to interstitial lung disease and untreated OSA --ORDER in-lab CPAP titration study due to patient's oxygen requirement and ILD --Increase your home oxygen to 10L with activity and sleep  Right upper lobe pulmonary nodule Mediastinal and hilar lymphadenopathy --Stable nodule and mediastinal adenopathy. Nodule SUV 2.1 similar to parenchyma.  Would be high risk for bronchoscopy with biopsy with current O2 requirements --CT Chest without contrast  Follow up with JE in 2 months

## 2019-08-11 NOTE — Telephone Encounter (Signed)
Called to notify patient.  Sent prescription for Earlie Server to [facility] mail order pharmacy per patient request.  Patient had no questions or concerns.   Verlin Fester, PharmD, [place], CPP Clinical Specialty Pharmacist [phone]  08/11/2019 4:15 PM

## 2019-08-11 NOTE — Progress Notes (Addendum)
Subjective:   PATIENT ID: Brent Holloway GENDER: male DOB: Feb 18, 1956, MRN: [id number]   HPI  Chief Complaint  Patient presents with  . 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 He has attended pulmonary rehab and now transitioning to home therapy with oxygen regulator due to pandemic restrictions. He will wear up to 10L on treadmill. He is compliant with his oxygen therapy at home. He is still awaiting [facility] referral.  Social History: Previously smoked 4-5 cigars a day. Quit in 2018.  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  Past Medical History:  Diagnosis Date  . DVT (deep venous thrombosis) ([place])   . Factor 5 Leiden mutation, heterozygous ([place])      Family History  Problem Relation Age of Onset  . Heart disease Father   . Congestive Heart Failure Father   . Dementia Mother      Social History   Occupational History  . Not on file  Tobacco Use  . Smoking status: Former Smoker    Packs/day: 0.25    Years: 44.00    Pack years: 11.00    Types: Cigars    Start date: 8    Quit date: 03/29/2017    Years since quitting: 2.3  . Smokeless tobacco: Never Used  . Tobacco comment: 5-6 cigars daily   Substance and Sexual Activity  . Alcohol use: Yes    Comment: occ  . Drug use: No  . Sexual activity: Not on file    No Known Allergies   Outpatient Medications Prior to Visit  Medication Sig Dispense Refill  . cetirizine (ZYRTEC) 10 MG tablet Take  10 mg by mouth daily.    . Nintedanib (OFEV) 150 MG CAPS Take 1 capsule (150 mg total) by mouth 2 (two) times daily. 60 capsule 2  . Omega-3 Fatty Acids (FISH OIL) 1000 MG CAPS Take 1 capsule by mouth daily.    Alveda Reasons 20 MG TABS tablet Take 20 mg by mouth daily.  2   No facility-administered medications prior to visit.    Review of Systems  Constitutional: Negative for chills, diaphoresis, fever, malaise/fatigue and weight loss.  HENT: Negative for congestion.   Respiratory: Positive for shortness of breath. Negative for cough, hemoptysis, sputum production and wheezing.   Cardiovascular: Negative for chest pain, palpitations and leg swelling.    Objective:   Vitals:   08/11/19 0920  Pulse: 80  Temp: (!) 97.3 F (36.3 C)  TempSrc: Temporal  SpO2: 96%  Weight: 282 lb 6.4 oz (128.1 kg)  Height: _0  (1.778 m)     Physical Exam: General: Well-appearing, obese no acute distress HENT: [place], AT Eyes: EOMI, no scleral icterus Respiratory: Bibasilar inspiratory rales Cardiovascular: RRR, -M/R/G, no JVD GI: BS+, soft, nontender Extremities: Chronic venous stasis changes, -tenderness Neuro: AAO x4, CNII-XII grossly intact Skin: Intact, no rashes or bruising Psych: Normal mood, normal affect  Data Reviewed:  Imaging: CT chest 07/08/2017 - Upper lobe predominant L>R subpleural reticulation with cystic-appearing lesions that may represent  early honeycombing CT Chest 05/09/19 - Progressive subpleural reticulation associated with GGO with diffuse involvement predominantly worse in the basilar regions with honeycombing and traction bronchiectasis. RUL nodule also noted to be increased 42m>>9mm compared to 07/09/19 imaging. PET/CT 08/09/19 - Progressive subpleural reticulation with GGO with diffuse involvement with honeycombing and traction bronchiectasis. Unchange 8-930mright upper lobe lung nodule with low-level hypermetabolic activity. Activity level is similar to what is found  diffusely throughout lung parenchyma  PFT: 04/15/19 FVC 3.29 (67%) FEV1 2.93 (80%) Ratio 87  TLC 61% DLCO 50% Interpretation: Mild restrictive defect with moderately reduced DLCO  Labs: ANA - neg HIV ab - nonreactive Anti-scleroderma ab - <0.2 Anti-ds DNA ab - 1 Aldolase - 7 CK - 129 ANCA screen - neg Anti-ribonucleic acid ab - 11.2 (neg) RF 13.1 SSA ab 0.5 SSB ab 0.2 CCP ab 6  Ambulatory O2 07/05/19 Patient Saturations on Room Air at Rest = 98% Patient Saturations on Room Air while Ambulating = 86% Patient Saturations on 3 Liters of oxygen while Ambulating = 92% CONCLUSION: Recommend 3L O2 with exertion and sleep  Ambulatory O2 08/11/19 Patient Saturations on Room Air at Rest = 91% Patient Saturations on Room Air while Ambulating = 83% Patient Saturations on 10 Liters of oxygen while Ambulating = 93%  Home sleep study 06/13/19 Severe sleep apnea AHI 34.1. Recommend CPAP sleep titration study  6MWT 06/13/19 - 659 feet. Required 4L  Imaging, labs and test noted above have been reviewed independently by me.    Assessment & Plan:   Discussion: [age]ear old male with hx RLE DVT secondary to heterozygous Factor V Leiden who presents for progressive idiopathic pulmonary fibrosis. PFT compared to 03/2019 to 12/20 demonstrated ~8% decrease in FEV1 and ~14% decrease in DLCO. Autoimmune work-up negative. 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 Will also begin Ofev which warrants intensive drug monitoring for adverse effects. We also discussed the benefits of treating OSA. Counseled on compliance with oxygen therapy, pulmonary rehab and medications. [facility].  Ambulatory O2 in-office today demonstrated increased O2 requirement to 10L compared to 3L from last month.  Interstitial Lung Disease secondary to IPF based on HRCT Consider occupational related ILD --We will contact [facility]  for Lung Transplant for status of referral --Continue Pulmonary Rehab at home with oxygen regulator --START Ofev. Will need CBC/CMP every month for the 1st 6 months, followed by q3 months. --START omeprazole 20 mg daily --Provided Breo 200 sample. One inhalation daily  ?Acute on chronic hypoxemic respiratory failure secondary to ILD flare Chronic hypoxemic respiratory failure secondary to interstitial lung disease and untreated OSA --ORDER in-lab CPAP titration study due to patient's oxygen requirement and ILD --Increase your home oxygen to 10L with activity and sleep --START prednisone taper: 60 mg x 3 days, 50 mg x 3 days, 40 mg x 3 days, 30 mg x 3 days, 20 mg x 3 days, 10 mg x 3 days, then STOP.  Right upper lobe pulmonary nodule Mediastinal and hilar lymphadenopathy --Stable nodule and mediastinal adenopathy. Nodule SUV 2.1 similar to parenchyma. Would be high risk for bronchoscopy with biopsy with current O2 requirements in lesion that is low likely to be cancer. With his above issues that pose a more imminent threat to his life, would not recommend aggressive management at this point. --CT Chest without contrast in 3 months  Health Maintenance Immunization History  Administered Date(s) Administered  . Influenza Inj  Mdck Quad Pf 03/23/2019   Orders Placed This Encounter  Procedures  . CT Chest Wo Contrast    Please schedule 6 months    Standing Status:   Future    Standing Expiration Date:   02/07/2021    Order Specific Question:   Preferred imaging location?    Answer:   [address]    Order Specific Question:   Radiology Contrast Protocol - do NOT remove file path    Answer:   [email or web address]  . CBC with Differential/Platelet    Standing Status:   Future    Number of Occurrences:   1    Standing Expiration Date:   08/10/2020  . Comp Met (CMET)    Standing Status:   Future    Number of Occurrences:   1    Standing Expiration Date:    08/10/2020  . Cpap titration    Standing Status:   Future    Standing Expiration Date:   08/10/2020    Scheduling Instructions:     Oxygen titration    Order Specific Question:   Where should this test be performed:    Answer:   [facility] ordered this encounter  Medications  . omeprazole (PRILOSEC) 20 MG capsule    Sig: Take 1 capsule (20 mg total) by mouth daily.    Dispense:  30 capsule    Refill:  6  . DISCONTD: fluticasone furoate-vilanterol (BREO ELLIPTA) 200-25 MCG/INH AEPB    Sig: Inhale 1 puff into the lungs daily.    Dispense:  1 each    Refill:  0    Order Specific Question:   Lot Number?    Answer:   [id number]    Order Specific Question:   Expiration Date?    Answer:   09/19/2019    Order Specific Question:   Manufacturer?    Answer:   [employer] [[id number]]    Order Specific Question:   Quantity    Answer:   1    Return in about 2 months (around 10/09/2019) for with me.  [place], MD [facility] 08/11/2019 8:59 AM  Office Number [phone]

## 2019-08-11 NOTE — Telephone Encounter (Signed)
Please start BIV for ICS/LABA inhaler (options include Symbicort, Breo Ellipta, Qvar, Dulera).  Will update once we receive a response. Will schedule appointment if inhaler training is needed.   Verlin Fester, PharmD, [place], CPP Clinical Specialty Pharmacist [phone]  08/11/2019 10:25 AM

## 2019-08-16 ENCOUNTER — Encounter (HOSPITAL_COMMUNITY): Payer: Managed Care, Other (non HMO)

## 2019-08-16 NOTE — Telephone Encounter (Signed)
New order was sent to [facility] on 1/21.  Nothing further needed.

## 2019-08-16 NOTE — Telephone Encounter (Signed)
PCC's please advise if this has been handled and if we can close this encounter thank you!

## 2019-08-17 NOTE — Telephone Encounter (Signed)
Brent Holloway called & left me vm.  He states as of today they are still pending financial approval. He states financial counselor documented yesterday they spoke to Brent Holloway's insurance and Brent Holloway has been assigned case manager with insurance co.  Right now just talking back and forth.  He states once approved they will contact Brent Holloway & contact us.  I called Brent Holloway & made him aware of what Kandee Keen told me.

## 2019-08-17 NOTE — Telephone Encounter (Signed)
I have not heard back from [facility].  I called this morning and have left him another vm and asked him to please call me back and let me know if he has received an update on referral.

## 2019-08-17 NOTE — Telephone Encounter (Signed)
Have you received a response?

## 2019-08-18 ENCOUNTER — Telehealth: Payer: Self-pay | Admitting: Pulmonary Disease

## 2019-08-18 ENCOUNTER — Encounter (HOSPITAL_COMMUNITY): Payer: Managed Care, Other (non HMO)

## 2019-08-18 DIAGNOSIS — J84112 Idiopathic pulmonary fibrosis: Secondary | ICD-10-CM

## 2019-08-18 MED ORDER — BREO ELLIPTA 200-25 MCG/INH IN AEPB: 1.0000 | INHALATION_SPRAY | Freq: Every day | RESPIRATORY_TRACT | 1 refills | Status: DC

## 2019-08-18 NOTE — Telephone Encounter (Signed)
Called and spoke with patient.  Patient stated he was given a Breo sample at last OV with Dr. Everardo All. Patient stated Dr. Everardo All told him if he felt Breo worked to call office for a prescription.  Patient stated Virgel Bouquet has improved his breathing.  Patient requested a Breo prescription be sent to [facility] [address]. Breo prescription sent to requested pharmacy. Nothing further at this time.

## 2019-08-22 ENCOUNTER — Other Ambulatory Visit (HOSPITAL_COMMUNITY)
Admission: RE | Admit: 2019-08-22 | Discharge: 2019-08-22 | Disposition: A | Discharge status: Home / Self Care | Payer: Managed Care, Other (non HMO) | Source: Ambulatory Visit | Attending: Internal Medicine | Admitting: Internal Medicine

## 2019-08-22 ENCOUNTER — Telehealth: Payer: Self-pay | Admitting: Pulmonary Disease

## 2019-08-22 ENCOUNTER — Telehealth: Payer: Self-pay | Admitting: *Deleted

## 2019-08-22 DIAGNOSIS — Z20822 Contact with and (suspected) exposure to covid-19: Secondary | ICD-10-CM | POA: Insufficient documentation

## 2019-08-22 DIAGNOSIS — Z01812 Encounter for preprocedural laboratory examination: Secondary | ICD-10-CM | POA: Diagnosis not present

## 2019-08-22 LAB — SARS CORONAVIRUS 2 (TAT 6-24 HRS): SARS Coronavirus 2: NEGATIVE

## 2019-08-22 NOTE — Telephone Encounter (Signed)
Yes he can take both

## 2019-08-22 NOTE — Telephone Encounter (Signed)
Patient of JE, was given a prednisone taper 08/11/19. Patient has not taken the prednisone yet, because he started Ofev 08/17/19 and he wasn't contacted by his pharmacy the prednisone was ready. Patient wants to know if he can take both prednisone and Ofev at the same time.  "Interstitial Lung Disease secondary to IPF based on HRCT Consider occupational related ILD --We will contact [facility] for Lung Transplant for status of referral --Continue Pulmonary Rehab at home with oxygen regulator --START Ofev. Will need CBC/CMP every month for the 1st 6 months, followed by q3 months. --START omeprazole 20 mg daily --Provided Breo 200 sample. One inhalation daily  ?Acute on chronic hypoxemic respiratory failure secondary to ILD flare Chronic hypoxemic respiratory failure secondary to interstitial lung disease and untreated OSA --ORDER in-lab CPAP titration study due to patient's oxygen requirement and ILD --Increase your home oxygen to 10L with activity and sleep --START prednisone taper: 60 mg x 3 days, 50 mg x 3 days, 40 mg x 3 days, 30 mg x 3 days, 20 mg x 3 days, 10 mg x 3 days, then STOP.  Right upper lobe pulmonary nodule Mediastinal and hilar lymphadenopathy --Stable nodule and mediastinal adenopathy. Nodule SUV 2.1 similar to parenchyma. Would be high risk for bronchoscopy with biopsy with current O2 requirements in lesion that is low likely to be cancer. With his above issues that pose a more imminent threat to his life, would not recommend aggressive management at this point. --CT Chest without contrast in 3 months"   Beth please advise if patient can take both, JE is on nights

## 2019-08-22 NOTE — Telephone Encounter (Signed)
Left message on patient's VM per DPR. Patient. He also needs an appointment 3 weeks with NP or JE virtual or in office for follow up.

## 2019-08-22 NOTE — Telephone Encounter (Signed)
ATC patient unable to reach LM to call back office (x1)  

## 2019-08-22 NOTE — Telephone Encounter (Signed)
-----   Message from Chi Mechele Collin, MD sent at 08/11/2019  9:45 PM EST ----- Please call patient to let him know that after consideration, I have prescribed a steroid taper for possible ILD flare-up. Recommend taking taper as instructed. Will need follow-up in 3 weeks via in person or virtual visit with me or NP.

## 2019-08-23 ENCOUNTER — Encounter (HOSPITAL_COMMUNITY)
Admission: RE | Admit: 2019-08-23 | Discharge: 2019-08-23 | Disposition: A | Discharge status: Home / Self Care | Payer: Managed Care, Other (non HMO) | Source: Ambulatory Visit | Attending: Pulmonary Disease | Admitting: Pulmonary Disease

## 2019-08-23 ENCOUNTER — Other Ambulatory Visit: Payer: Self-pay

## 2019-08-23 DIAGNOSIS — Z20828 Contact with and (suspected) exposure to other viral communicable diseases: Secondary | ICD-10-CM | POA: Insufficient documentation

## 2019-08-23 DIAGNOSIS — Z87891 Personal history of nicotine dependence: Secondary | ICD-10-CM | POA: Insufficient documentation

## 2019-08-23 DIAGNOSIS — J849 Interstitial pulmonary disease, unspecified: Secondary | ICD-10-CM | POA: Insufficient documentation

## 2019-08-23 NOTE — Progress Notes (Signed)
Follow up call to patient since in person exercise has been suspended in pulmonary rehab.Brent Holloway has an appointment @ [facility] 09/09/2019 to begin the process of evaluation for lung transplant.  He received a replacement home oxygen concentrator that goes up to 10L which he requires when exercising.  He has been trying to exercise daily, instructed that pulmonary patients should exercise 5 days/week.  He has increased his speed on the treadmill to 1.8 mph/0 incline and feels he has tolerated it better with 10 L of oxygen.  Discussed progression of exercise on the treadmill in the future.  Also, Brent Holloway has started taking OFEV and is tolerating it well.  He had I episode of diarrhea, took immodium and it resolved.  I will continue to follow while exercise has been suspended in pulmonary rehab.

## 2019-08-23 NOTE — Telephone Encounter (Signed)
LMTCB x2 for pt 

## 2019-08-24 ENCOUNTER — Ambulatory Visit (HOSPITAL_BASED_OUTPATIENT_CLINIC_OR_DEPARTMENT_OTHER): Payer: Managed Care, Other (non HMO) | Attending: Pulmonary Disease | Admitting: Internal Medicine

## 2019-08-24 ENCOUNTER — Other Ambulatory Visit: Payer: Self-pay

## 2019-08-24 DIAGNOSIS — R911 Solitary pulmonary nodule: Secondary | ICD-10-CM

## 2019-08-24 DIAGNOSIS — G4733 Obstructive sleep apnea (adult) (pediatric): Secondary | ICD-10-CM | POA: Diagnosis not present

## 2019-08-24 DIAGNOSIS — J9611 Chronic respiratory failure with hypoxia: Secondary | ICD-10-CM

## 2019-08-24 DIAGNOSIS — R918 Other nonspecific abnormal finding of lung field: Secondary | ICD-10-CM

## 2019-08-24 NOTE — Telephone Encounter (Signed)
Called and spoke to pt. Informed him of the recs per [facility]. Pt also scheduled for 3 week follow up with APP. Pt verbalized understanding and denied any further questions or concerns at this time.

## 2019-08-24 NOTE — Telephone Encounter (Signed)
Called and spoke to pt. 3 week follow up has been scheduled with Elisha Headland on 09/15/2019. Pt verbalized understanding and denied any further questions or concerns at this time.

## 2019-08-28 DIAGNOSIS — R918 Other nonspecific abnormal finding of lung field: Secondary | ICD-10-CM | POA: Diagnosis not present

## 2019-08-28 NOTE — Procedures (Signed)
Patient Name: Brent Holloway, [name] Date: 08/24/2019 Gender: Male D.O.B: 1955/12/28 Age (years): 22 Referring Provider: Chi Mechele Collin Height (inches): 71 Interpreting Physician: Jetty Duhamel MD, ABSM Weight (lbs): 272 RPSGT: Ulyess Mort BMI: 38 MRN: [id number] Neck Size: 17.50  CLINICAL INFORMATION The patient is referred for a CPAP titration to treat sleep apnea.  Date of NPSG, Split Night or HST:  HST 06/13/2019   AHI 34.1/ hr, desaturation to 70%,  body weight 272 lbs  SLEEP STUDY TECHNIQUE As per the AASM Manual for the Scoring of Sleep and Associated Events v2.3 (April 2016) with a hypopnea requiring 4% desaturations.  The channels recorded and monitored were frontal, central and occipital EEG, electrooculogram (EOG), submentalis EMG (chin), nasal and oral airflow, thoracic and abdominal wall motion, anterior tibialis EMG, snore microphone, electrocardiogram, and pulse oximetry. Continuous positive airway pressure (CPAP) was initiated at the beginning of the study and titrated to treat sleep-disordered breathing.  MEDICATIONS Medications self-administered by patient taken the night of the study : none reported  TECHNICIAN COMMENTS Comments added by technician: PATIENT WAS ORDERED AS A CPAP TITRATION. Comments added by scorer: N/A  RESPIRATORY PARAMETERS Optimal PAP Pressure (cm): 13 AHI at Optimal Pressure (/hr): 0.0 Overall Minimal O2 (%): 89.0 Supine % at Optimal Pressure (%): 0 Minimal O2 at Optimal Pressure (%): 93.0   SLEEP ARCHITECTURE The study was initiated at 9:39:09 PM and ended at 4:26:47 AM.  Sleep onset time was 12.4 minutes and the sleep efficiency was 67.8%%. The total sleep time was 276.5 minutes.  The patient spent 11.9%% of the night in stage N1 sleep, 72.0%% in stage N2 sleep, 0.0%% in stage N3 and 16.1% in REM.Stage REM latency was 70.0 minutes  Wake after sleep onset was 118.7. Alpha intrusion was absent. Supine sleep was  0.72%.  CARDIAC DATA The 2 lead EKG demonstrated sinus rhythm. The mean heart rate was 69.1 beats per minute. Other EKG findings include: None.  LEG MOVEMENT DATA The total Periodic Limb Movements of Sleep (PLMS) were 0. The PLMS index was 0.0. A PLMS index of <15 is considered normal in adults.  IMPRESSIONS - The optimal PAP pressure was 13 cm of water. - Central sleep apnea was not noted during this titration (CAI = 0.0/h). - The patient arived on O2 5L. Tech reported being able to taper O2 to 2.5 L, maintaining O2 saturation at 90% or higher while on CPAP. - The patient snored with moderate snoring volume during this titration study. - No cardiac abnormalities were observed during this study. - Clinically significant periodic limb movements were not noted during this study. Arousals associated with PLMs were rare.  DIAGNOSIS - Obstructive Sleep Apnea (327.23 [G47.33 ICD-10])  RECOMMENDATIONS - Trial of CPAP therapy on 13 cm H2O or autopap 5-15. - Patient wore a Medium size Philips Respironics Full Face Mask Amara View mask and heated humidification. - Supplemental O2 was maintained at 2.5 L while on CPAP, holding O2 saturation at 90% or higher. - Be careful with alcohol, sedatives and other CNS depressants that may worsen sleep apnea and disrupt normal sleep architecture. - Sleep hygiene should be reviewed to assess factors that may improve sleep quality. - Weight management and regular exercise should be initiated or continued.  [Electronically signed] 08/28/2019 12:15 PM  Jetty Duhamel MD, ABSM Diplomate, American Board of Sleep Medicine   NPI: [id number]  [place], Tax adviser of Sleep Medicine  ELECTRONICALLY SIGNED ON:  08/28/2019, 12:01 PM [place] PH: [phone]   FX: [phone] [place]

## 2019-08-29 ENCOUNTER — Encounter (HOSPITAL_COMMUNITY)
Admission: RE | Admit: 2019-08-29 | Discharge: 2019-08-29 | Disposition: A | Discharge status: Home / Self Care | Payer: Managed Care, Other (non HMO) | Source: Ambulatory Visit | Attending: Pulmonary Disease | Admitting: Pulmonary Disease

## 2019-08-29 ENCOUNTER — Other Ambulatory Visit: Payer: Self-pay

## 2019-08-29 NOTE — Progress Notes (Signed)
Weekly follow up call to patient re: pulmonary rehab.  He is exercising at home on a treadmill 3-5 days per week, is tolerating Ofev well.  Informed that in person pulmonary rehab will begin Tuesday, 09/12/2010 and Genevie Cheshire would like to continue in the 1345 class.

## 2019-08-31 ENCOUNTER — Other Ambulatory Visit: Payer: Self-pay | Admitting: Pulmonary Disease

## 2019-08-31 DIAGNOSIS — G4733 Obstructive sleep apnea (adult) (pediatric): Secondary | ICD-10-CM

## 2019-08-31 NOTE — Progress Notes (Signed)
Please set up patient with autoPAP 5-15 with humidification. Will need 2.5L oxygen with CPAP.

## 2019-08-31 NOTE — Telephone Encounter (Signed)
Pt had in Dec 2020  He has televisit with Arlys John in Feb 2021  Can discuss need for return at that time

## 2019-09-06 NOTE — Progress Notes (Addendum)
Pulmonary Individual Treatment Plan  Patient Details  Name: Brent Holloway MRN: [id number] Date of Birth: April 22, 1956 Referring Provider:     Pulmonary Rehab Walk Test from 06/13/2019 in [facility]  Referring Provider  Dr. [name]      Initial Encounter Date:    Pulmonary Rehab Walk Test from 06/13/2019 in [facility]  Date  06/13/19      Visit Diagnosis: Interstitial lung disease ([place])  Patient's Home Medications on Admission:   Current Outpatient Medications:  .  cetirizine (ZYRTEC) 10 MG tablet, Take 10 mg by mouth daily., Disp: , Rfl:  .  fluticasone furoate-vilanterol (BREO ELLIPTA) 200-25 MCG/INH AEPB, Inhale 1 puff into the lungs daily., Disp: 60 each, Rfl: 1 .  Multiple Vitamins-Minerals (MULTIVITAMIN WITH MINERALS) tablet, Take 1 tablet by mouth daily., Disp: , Rfl:  .  Nintedanib (OFEV) 150 MG CAPS, Take 1 capsule (150 mg total) by mouth 2 (two) times daily. (Patient not taking: Reported on 08/11/2019), Disp: 60 capsule, Rfl: 2 .  Omega-3 Fatty Acids (FISH OIL) 1000 MG CAPS, Take 1 capsule by mouth daily., Disp: , Rfl:  .  omeprazole (PRILOSEC) 20 MG capsule, Take 1 capsule (20 mg total) by mouth daily., Disp: 30 capsule, Rfl: 6 .  predniSONE (DELTASONE) 10 MG tablet, Take 6 tablets x three days (60 mg), followed by 5 tablets x three days ('50mg'$ ), then 4 tablets x three day('40mg'$ ), then 3 tablets ('30mg'$ ) x three days, then 2 tablets ('20mg'$ ) x three days, then 1 tablet ('10mg'$ ) x three days, then STOP, Disp: 63 tablet, Rfl: 0 .  XARELTO 20 MG TABS tablet, Take 20 mg by mouth daily., Disp: , Rfl: 2  Past Medical History: Past Medical History:  Diagnosis Date  . DVT (deep venous thrombosis) ([place])   . Factor 5 Leiden mutation, heterozygous ([place])     Tobacco Use: Social History   Tobacco Use  Smoking Status Former Smoker  . Packs/day: 0.25  . Years: 44.00  . Pack years: 11.00  . Types: Cigars  . Start date: 28   . Quit date: 03/29/2017  . Years since quitting: 2.4  Smokeless Tobacco Never Used  Tobacco Comment   5-6 cigars daily     Labs: Recent Review Flowsheet Data    There is no flowsheet data to display.      Capillary Blood Glucose: Lab Results  Component Value Date   GLUCAP 96 08/09/2019     Pulmonary Assessment Scores: Pulmonary Assessment Scores    Row Name 06/13/19 1123 06/13/19 1216       ADL UCSD   ADL Phase  Entry  Entry    SOB Score total  58  --      CAT Score   CAT Score  17  --      mMRC Score   mMRC Score  --  4      UCSD: Self-administered rating of dyspnea associated with activities of daily living (ADLs) 6-point scale (0 = "not at all" to 5 = "maximal or unable to do because of breathlessness")  Scoring Scores range from 0 to 120.  Minimally important difference is 5 units  CAT: CAT can identify the health impairment of COPD patients and is better correlated with disease progression.  CAT has a scoring range of zero to 40. The CAT score is classified into four groups of low (less than 10), medium (10 - 20), high (21-30) and very high (31-40) based on  the impact level of disease on health status. A CAT score over 10 suggests significant symptoms.  A worsening CAT score could be explained by an exacerbation, poor medication adherence, poor inhaler technique, or progression of COPD or comorbid conditions.  CAT MCID is 2 points  mMRC: mMRC (Modified Medical Research Council) Dyspnea Scale is used to assess the degree of baseline functional disability in patients of respiratory disease due to dyspnea. No minimal important difference is established. A decrease in score of 1 point or greater is considered a positive change.   Pulmonary Function Assessment: Pulmonary Function Assessment - 06/13/19 1123      Breath   Shortness of Breath  Yes;Limiting activity       Exercise Target Goals: Exercise Program Goal: Individual exercise prescription set using  results from initial 6 min walk test and THRR while considering  patient's activity barriers and safety.   Exercise Prescription Goal: Initial exercise prescription builds to 30-45 minutes a day of aerobic activity, 2-3 days per week.  Home exercise guidelines will be given to patient during program as part of exercise prescription that the participant will acknowledge.  Activity Barriers & Risk Stratification: Activity Barriers & Cardiac Risk Stratification - 06/13/19 1117      Activity Barriers & Cardiac Risk Stratification   Activity Barriers  Back Problems;Arthritis;Deconditioning;Muscular Weakness;Shortness of Breath       6 Minute Walk: 6 Minute Walk    Row Name 06/13/19 1217 07/05/19 1637       6 Minute Walk   Phase  Initial  Mid Program    Distance  659 feet  800 feet    Walk Time  5 minutes  5 minutes    # of Rest Breaks  1  1    MPH  1.25  1.52    METS  2.01  2.35    RPE  12  13    Perceived Dyspnea   2  1    VO2 Peak  7.05  8.22    Symptoms  Yes (comment)  Yes (comment)    Comments  1 min standing rest due to desat  1 min standing rest due to desat    Resting HR  75 bpm  91 bpm    Resting BP  144/90  128/74    Resting Oxygen Saturation   100 %  98 %    Exercise Oxygen Saturation  during 6 min walk  80 %  83 %    Max Ex. HR  121 bpm  127 bpm    Max Ex. BP  164/92  162/60    2 Minute Post BP  130/78  128/70      Interval HR   1 Minute HR  106  108    2 Minute HR  117  127    3 Minute HR  111  96    4 Minute HR  101  109    5 Minute HR  114  116    6 Minute HR  121  121    2 Minute Post HR  97  101    Interval Heart Rate?  Yes  Yes      Interval Oxygen   Interval Oxygen?  Yes  Yes    Baseline Oxygen Saturation %  100 %  98 %    1 Minute Oxygen Saturation %  98 %  95 %    1 Minute Liters of Oxygen  3 L  4 L  2 Minute Oxygen Saturation %  81 %  83 %    2 Minute Liters of Oxygen  3 L  4 L    3 Minute Oxygen Saturation %  80 %  89 %    3 Minute Liters of  Oxygen  4 L  6 L    4 Minute Oxygen Saturation %  96 %  94 %    4 Minute Liters of Oxygen  4 L  6 L    5 Minute Oxygen Saturation %  93 %  91 %    5 Minute Liters of Oxygen  4 L  6 L    6 Minute Oxygen Saturation %  86 %  87 %    6 Minute Liters of Oxygen  4 L  6 L    2 Minute Post Oxygen Saturation %  91 %  98 %    2 Minute Post Liters of Oxygen  3 L  6 L       Oxygen Initial Assessment: Oxygen Initial Assessment - 06/13/19 1215      Home Oxygen   Home Oxygen Device  Portable Concentrator;E-Tanks    Sleep Oxygen Prescription  Continuous    Liters per minute  3    Home Exercise Oxygen Prescription  Continuous    Liters per minute  3    Home at Rest Exercise Oxygen Prescription  Continuous    Liters per minute  3    Compliance with Home Oxygen Use  Yes      Initial 6 min Walk   Oxygen Used  Continuous    Liters per minute  4      Program Oxygen Prescription   Program Oxygen Prescription  Continuous    Liters per minute  4    Comments  may need to increase to 6      Intervention   Short Term Goals  To learn and exhibit compliance with exercise, home and travel O2 prescription;To learn and understand importance of monitoring SPO2 with pulse oximeter and demonstrate accurate use of the pulse oximeter.;To learn and understand importance of maintaining oxygen saturations>88%;To learn and demonstrate proper pursed lip breathing techniques or other breathing techniques.;To learn and demonstrate proper use of respiratory medications    Long  Term Goals  Exhibits compliance with exercise, home and travel O2 prescription;Verbalizes importance of monitoring SPO2 with pulse oximeter and return demonstration;Maintenance of O2 saturations>88%;Exhibits proper breathing techniques, such as pursed lip breathing or other method taught during program session;Compliance with respiratory medication       Oxygen Re-Evaluation: Oxygen Re-Evaluation    Row Name 07/04/19 1007 09/06/19 0915            Program Oxygen Prescription   Program Oxygen Prescription  Continuous  Continuous      Liters per minute  8  8      Comments  4 L while seated and 8 L on TM  4 L while seated and 8 L on TM        Home Oxygen   Home Oxygen Device  Portable Concentrator;E-Tanks  Portable Concentrator;E-Tanks      Sleep Oxygen Prescription  Continuous  Continuous      Liters per minute  3  3      Home Exercise Oxygen Prescription  Continuous  Continuous      Liters per minute  8  8      Home at Rest Exercise Oxygen Prescription  Continuous  Continuous  Liters per minute  3  3      Compliance with Home Oxygen Use  No Pt routinely walks in to rehab from car on 2 L and sats <80%  No        Goals/Expected Outcomes   Short Term Goals  To learn and exhibit compliance with exercise, home and travel O2 prescription;To learn and understand importance of monitoring SPO2 with pulse oximeter and demonstrate accurate use of the pulse oximeter.;To learn and understand importance of maintaining oxygen saturations>88%;To learn and demonstrate proper pursed lip breathing techniques or other breathing techniques.;To learn and demonstrate proper use of respiratory medications  To learn and exhibit compliance with exercise, home and travel O2 prescription;To learn and understand importance of monitoring SPO2 with pulse oximeter and demonstrate accurate use of the pulse oximeter.;To learn and understand importance of maintaining oxygen saturations>88%;To learn and demonstrate proper pursed lip breathing techniques or other breathing techniques.;To learn and demonstrate proper use of respiratory medications      Long  Term Goals  Exhibits compliance with exercise, home and travel O2 prescription;Verbalizes importance of monitoring SPO2 with pulse oximeter and return demonstration;Maintenance of O2 saturations>88%;Exhibits proper breathing techniques, such as pursed lip breathing or other method taught during program  session;Compliance with respiratory medication  Exhibits compliance with exercise, home and travel O2 prescription;Verbalizes importance of monitoring SPO2 with pulse oximeter and return demonstration;Maintenance of O2 saturations>88%;Exhibits proper breathing techniques, such as pursed lip breathing or other method taught during program session;Compliance with respiratory medication      Goals/Expected Outcomes  compliance  compliance         Oxygen Discharge (Final Oxygen Re-Evaluation): Oxygen Re-Evaluation - 09/06/19 0915      Program Oxygen Prescription   Program Oxygen Prescription  Continuous    Liters per minute  8    Comments  4 L while seated and 8 L on TM      Home Oxygen   Home Oxygen Device  Portable Concentrator;E-Tanks    Sleep Oxygen Prescription  Continuous    Liters per minute  3    Home Exercise Oxygen Prescription  Continuous    Liters per minute  8    Home at Rest Exercise Oxygen Prescription  Continuous    Liters per minute  3    Compliance with Home Oxygen Use  No      Goals/Expected Outcomes   Short Term Goals  To learn and exhibit compliance with exercise, home and travel O2 prescription;To learn and understand importance of monitoring SPO2 with pulse oximeter and demonstrate accurate use of the pulse oximeter.;To learn and understand importance of maintaining oxygen saturations>88%;To learn and demonstrate proper pursed lip breathing techniques or other breathing techniques.;To learn and demonstrate proper use of respiratory medications    Long  Term Goals  Exhibits compliance with exercise, home and travel O2 prescription;Verbalizes importance of monitoring SPO2 with pulse oximeter and return demonstration;Maintenance of O2 saturations>88%;Exhibits proper breathing techniques, such as pursed lip breathing or other method taught during program session;Compliance with respiratory medication    Goals/Expected Outcomes  compliance       Initial Exercise  Prescription: Initial Exercise Prescription - 06/13/19 1200      Date of Initial Exercise RX and Referring Provider   Date  06/13/19    Referring Provider  Dr. [name]      Oxygen   Oxygen  Continuous    Liters  4      Treadmill   MPH  1.2  Grade  1    Minutes  15      NuStep   Level  2    SPM  80    Minutes  15      Prescription Details   Frequency (times per week)  2    Duration  Progress to 30 minutes of continuous aerobic without signs/symptoms of physical distress      Intensity   THRR 40-80% of Max Heartrate  63-126    Ratings of Perceived Exertion  11-13    Perceived Dyspnea  0-4      Progression   Progression  Continue progressive overload as per policy without signs/symptoms or physical distress.      Resistance Training   Training Prescription  Yes    Weight  blue bands    Reps  10-15       Perform Capillary Blood Glucose checks as needed.  Exercise Prescription Changes: Exercise Prescription Changes    Row Name 06/21/19 1500 07/05/19 1400 07/19/19 1500         Response to Exercise   Blood Pressure (Admit)  126/64  128/74  120/70     Blood Pressure (Exercise)  144/70  140/72  150/70     Blood Pressure (Exit)  124/78  128/70  114/80     Heart Rate (Admit)  98 bpm  91 bpm  80 bpm     Heart Rate (Exercise)  134 bpm When O2 sat down to 78%  122 bpm  121 bpm     Heart Rate (Exit)  100 bpm  101 bpm  89 bpm     Oxygen Saturation (Admit)  99 %  98 %  100 %     Oxygen Saturation (Exercise)  78 % On treadmill, O2 increased to 6 L  89 %  84 % On treadmill sat dropped to 84% increased O2 to 10 L sat 88%     Oxygen Saturation (Exit)  100 %  98 %  99 %     Rating of Perceived Exertion (Exercise)  '13  12  11     '$ Perceived Dyspnea (Exercise)  3  1  2.5     Duration  Progress to 30 minutes of  aerobic without signs/symptoms of physical distress  Continue with 30 min of aerobic exercise without signs/symptoms of physical distress.  Continue with 30 min of aerobic  exercise without signs/symptoms of physical distress.     Intensity  Other (comment)  THRR unchanged  THRR unchanged       Progression   Progression  -- 40-80% of HRR  Continue to progress workloads to maintain intensity without signs/symptoms of physical distress.  Continue to follow PAD protocol       Resistance Training   Training Prescription  Yes  Yes  Yes     Weight  blue bands  blue bands  blue bands     Reps  10-15  10-15  10-15     Time  10 Minutes  10 Minutes  10 Minutes       Oxygen   Oxygen  Continuous  Continuous  Continuous     Liters  4-6  4-8  4-10       Treadmill   MPH  1.4  1.6  1.6     Grade  '1  1  1     '$ Minutes  '15  15  15       '$ NuStep   Level  2  3  3     SPM  80  80  80     Minutes  '15  15  15     '$ METs  1.5  2.1  2.1        Exercise Comments:   Exercise Goals and Review: Exercise Goals    Row Name 06/13/19 1223 07/04/19 1008 09/06/19 0916         Exercise Goals   Increase Physical Activity  Yes  Yes  Yes     Intervention  Provide advice, education, support and counseling about physical activity/exercise needs.;Develop an individualized exercise prescription for aerobic and resistive training based on initial evaluation findings, risk stratification, comorbidities and participant's personal goals.  Provide advice, education, support and counseling about physical activity/exercise needs.;Develop an individualized exercise prescription for aerobic and resistive training based on initial evaluation findings, risk stratification, comorbidities and participant's personal goals.  Provide advice, education, support and counseling about physical activity/exercise needs.;Develop an individualized exercise prescription for aerobic and resistive training based on initial evaluation findings, risk stratification, comorbidities and participant's personal goals.     Expected Outcomes  Short Term: Attend rehab on a regular basis to increase amount of physical  activity.;Long Term: Add in home exercise to make exercise part of routine and to increase amount of physical activity.;Long Term: Exercising regularly at least 3-5 days a week.  Short Term: Attend rehab on a regular basis to increase amount of physical activity.;Long Term: Add in home exercise to make exercise part of routine and to increase amount of physical activity.;Long Term: Exercising regularly at least 3-5 days a week.  Short Term: Attend rehab on a regular basis to increase amount of physical activity.;Long Term: Add in home exercise to make exercise part of routine and to increase amount of physical activity.;Long Term: Exercising regularly at least 3-5 days a week.     Increase Strength and Stamina  Yes  Yes  Yes     Intervention  Provide advice, education, support and counseling about physical activity/exercise needs.;Develop an individualized exercise prescription for aerobic and resistive training based on initial evaluation findings, risk stratification, comorbidities and participant's personal goals.  Provide advice, education, support and counseling about physical activity/exercise needs.;Develop an individualized exercise prescription for aerobic and resistive training based on initial evaluation findings, risk stratification, comorbidities and participant's personal goals.  Provide advice, education, support and counseling about physical activity/exercise needs.;Develop an individualized exercise prescription for aerobic and resistive training based on initial evaluation findings, risk stratification, comorbidities and participant's personal goals.     Expected Outcomes  Short Term: Increase workloads from initial exercise prescription for resistance, speed, and METs.;Short Term: Perform resistance training exercises routinely during rehab and add in resistance training at home;Long Term: Improve cardiorespiratory fitness, muscular endurance and strength as measured by increased METs and  functional capacity (6MWT)  Short Term: Increase workloads from initial exercise prescription for resistance, speed, and METs.;Short Term: Perform resistance training exercises routinely during rehab and add in resistance training at home;Long Term: Improve cardiorespiratory fitness, muscular endurance and strength as measured by increased METs and functional capacity (6MWT)  Short Term: Increase workloads from initial exercise prescription for resistance, speed, and METs.;Short Term: Perform resistance training exercises routinely during rehab and add in resistance training at home;Long Term: Improve cardiorespiratory fitness, muscular endurance and strength as measured by increased METs and functional capacity (6MWT)     Able to understand and use rate of perceived exertion (RPE) scale  Yes  Yes  Yes  Intervention  Provide education and explanation on how to use RPE scale  Provide education and explanation on how to use RPE scale  Provide education and explanation on how to use RPE scale     Expected Outcomes  Short Term: Able to use RPE daily in rehab to express subjective intensity level;Long Term:  Able to use RPE to guide intensity level when exercising independently  Short Term: Able to use RPE daily in rehab to express subjective intensity level;Long Term:  Able to use RPE to guide intensity level when exercising independently  Short Term: Able to use RPE daily in rehab to express subjective intensity level;Long Term:  Able to use RPE to guide intensity level when exercising independently     Able to understand and use Dyspnea scale  Yes  Yes  Yes     Intervention  Provide education and explanation on how to use Dyspnea scale  Provide education and explanation on how to use Dyspnea scale  Provide education and explanation on how to use Dyspnea scale     Expected Outcomes  Short Term: Able to use Dyspnea scale daily in rehab to express subjective sense of shortness of breath during exertion;Long  Term: Able to use Dyspnea scale to guide intensity level when exercising independently  Short Term: Able to use Dyspnea scale daily in rehab to express subjective sense of shortness of breath during exertion;Long Term: Able to use Dyspnea scale to guide intensity level when exercising independently  Short Term: Able to use Dyspnea scale daily in rehab to express subjective sense of shortness of breath during exertion;Long Term: Able to use Dyspnea scale to guide intensity level when exercising independently     Knowledge and understanding of Target Heart Rate Range (THRR)  Yes  Yes  Yes     Intervention  --  Provide education and explanation of THRR including how the numbers were predicted and where they are located for reference  Provide education and explanation of THRR including how the numbers were predicted and where they are located for reference     Expected Outcomes  Short Term: Able to state/look up THRR;Short Term: Able to use daily as guideline for intensity in rehab;Long Term: Able to use THRR to govern intensity when exercising independently  Short Term: Able to state/look up THRR;Short Term: Able to use daily as guideline for intensity in rehab;Long Term: Able to use THRR to govern intensity when exercising independently  Short Term: Able to state/look up THRR;Short Term: Able to use daily as guideline for intensity in rehab;Long Term: Able to use THRR to govern intensity when exercising independently     Understanding of Exercise Prescription  Yes  Yes  Yes     Intervention  Provide education, explanation, and written materials on patient's individual exercise prescription  Provide education, explanation, and written materials on patient's individual exercise prescription  Provide education, explanation, and written materials on patient's individual exercise prescription     Expected Outcomes  Short Term: Able to explain program exercise prescription;Long Term: Able to explain home exercise  prescription to exercise independently  Short Term: Able to explain program exercise prescription;Long Term: Able to explain home exercise prescription to exercise independently  Short Term: Able to explain program exercise prescription;Long Term: Able to explain home exercise prescription to exercise independently        Exercise Goals Re-Evaluation : Exercise Goals Re-Evaluation    Row Name 07/04/19 1009 09/06/19 2458  Exercise Goal Re-Evaluation   Exercise Goals Review  Increase Physical Activity;Increase Strength and Stamina;Able to understand and use rate of perceived exertion (RPE) scale;Able to understand and use Dyspnea scale;Knowledge and understanding of Target Heart Rate Range (THRR);Understanding of Exercise Prescription  Increase Physical Activity;Increase Strength and Stamina;Able to understand and use rate of perceived exertion (RPE) scale;Able to understand and use Dyspnea scale;Knowledge and understanding of Target Heart Rate Range (THRR);Understanding of Exercise Prescription      Comments  Pt has completed 4 exercise sessions. It was determined after his first two visits that he needs 8 L while walking on the treadmill. I will be able to progress pt now that his oxygen needs have been determined. Pt currently exercises at 2.1 METs on the stepper. Will continue to monitor and progress as able.  Pt has been walking on his treadmill since we have ceased in person exercise. Pt will return 2/23. Will monitor and progress as able.      Expected Outcomes  Through exercise at rehab and at home, the patient will decrease shortness of breath with daily activities and feel confident in carrying out an exercise regime at home.  Through exercise at rehab and at home, the patient will decrease shortness of breath with daily activities and feel confident in carrying out an exercise regime at home.         Discharge Exercise Prescription (Final Exercise Prescription Changes): Exercise  Prescription Changes - 07/19/19 1500      Response to Exercise   Blood Pressure (Admit)  120/70    Blood Pressure (Exercise)  150/70    Blood Pressure (Exit)  114/80    Heart Rate (Admit)  80 bpm    Heart Rate (Exercise)  121 bpm    Heart Rate (Exit)  89 bpm    Oxygen Saturation (Admit)  100 %    Oxygen Saturation (Exercise)  84 %   On treadmill sat dropped to 84% increased O2 to 10 L sat 88%   Oxygen Saturation (Exit)  99 %    Rating of Perceived Exertion (Exercise)  11    Perceived Dyspnea (Exercise)  2.5    Duration  Continue with 30 min of aerobic exercise without signs/symptoms of physical distress.    Intensity  THRR unchanged      Progression   Progression  Continue to follow PAD protocol      Resistance Training   Training Prescription  Yes    Weight  blue bands    Reps  10-15    Time  10 Minutes      Oxygen   Oxygen  Continuous    Liters  4-10      Treadmill   MPH  1.6    Grade  1    Minutes  15      NuStep   Level  3    SPM  80    Minutes  15    METs  2.1       Nutrition:  Target Goals: Understanding of nutrition guidelines, daily intake of sodium '1500mg'$ , cholesterol '200mg'$ , calories 30% from fat and 7% or less from saturated fats, daily to have 5 or more servings of fruits and vegetables.  Biometrics: Pre Biometrics - 06/13/19 1159      Pre Biometrics   Grip Strength  45 kg        Nutrition Therapy Plan and Nutrition Goals: Nutrition Therapy & Goals - 06/28/19 1517      Personal Nutrition  Goals   Nutrition Goal  Pt to identify food quantities necessary to achieve weight loss of 6-24 lb at graduation from pulmonary rehab.    Personal Goal #2  Pt to identify and limit food sources of sodium      Intervention Plan   Intervention  Prescribe, educate and counsel regarding individualized specific dietary modifications aiming towards targeted core components such as weight, hypertension, lipid management, diabetes, heart failure and other  comorbidities.;Nutrition handout(s) given to patient.    Expected Outcomes  Short Term Goal: Understand basic principles of dietary content, such as calories, fat, sodium, cholesterol and nutrients.;Short Term Goal: A plan has been developed with personal nutrition goals set during dietitian appointment.;Long Term Goal: Adherence to prescribed nutrition plan.       Nutrition Assessments: Nutrition Assessments - 06/28/19 1517      Rate Your Plate Scores   Pre Score  59       Nutrition Goals Re-Evaluation: Nutrition Goals Re-Evaluation    [place] Name 06/28/19 1525             Goals   Current Weight  276 lb (125.2 kg)       Nutrition Goal  Pt to identify food quantities necessary to achieve weight loss of 6-24 lb at graduation from pulmonary rehab.         Personal Goal #2 Re-Evaluation   Personal Goal #2  Pt to identify and limit food sources of sodium          Nutrition Goals Discharge (Final Nutrition Goals Re-Evaluation): Nutrition Goals Re-Evaluation - 06/28/19 1525      Goals   Current Weight  276 lb (125.2 kg)    Nutrition Goal  Pt to identify food quantities necessary to achieve weight loss of 6-24 lb at graduation from pulmonary rehab.      Personal Goal #2 Re-Evaluation   Personal Goal #2  Pt to identify and limit food sources of sodium       Psychosocial: Target Goals: Acknowledge presence or absence of significant depression and/or stress, maximize coping skills, provide positive support system. Participant is able to verbalize types and ability to use techniques and skills needed for reducing stress and depression.  Initial Review & Psychosocial Screening:   Quality of Life Scores:  Scores of 19 and below usually indicate a poorer quality of life in these areas.  A difference of  2-3 points is a clinically meaningful difference.  A difference of 2-3 points in the total score of the Quality of Life Index has been associated with significant improvement in  overall quality of life, self-image, physical symptoms, and general health in studies assessing change in quality of life.  PHQ-9: Recent Review Flowsheet Data    Depression screen [facility] 2/9 06/13/2019 06/13/2019   Decreased Interest 0 0   Down, Depressed, Hopeless 0 0   PHQ - 2 Score 0 0   Altered sleeping 0 -   Tired, decreased energy 0 -   Change in appetite 0 -   Feeling bad or failure about yourself  0 -   Trouble concentrating 0 -   Moving slowly or fidgety/restless 0 -   Suicidal thoughts 0 -   PHQ-9 Score 0 -   Difficult doing work/chores Not difficult at all -     Interpretation of Total Score  Total Score Depression Severity:  1-4 = Minimal depression, 5-9 = Mild depression, 10-14 = Moderate depression, 15-19 = Moderately severe depression, 20-27 = Severe depression  Psychosocial Evaluation and Intervention: Psychosocial Evaluation - 07/04/19 1338      Psychosocial Evaluation & Interventions   Interventions  Encouraged to exercise with the program and follow exercise prescription    Continue Psychosocial Services   No Follow up required       Psychosocial Re-Evaluation: Psychosocial Re-Evaluation    [place] Name 07/04/19 1338 09/05/19 1417           Psychosocial Re-Evaluation   Current issues with  None Identified  None Identified      Comments  Brent Holloway has no barriers or psychosocial concerns at this time, he has participated in pulmonary rehab for 2 weeks.  Department closure x 7 weeks, will reopen 09/13/2019, will assess psychosocial barriers or concerns upon return to program      Expected Outcomes  That Brent Holloway will continue to have no barriers or psychosocial concerns while participating in pulmonary rehab.  Will continue to have no barriers or psychosocial concerns while in pulmonary rehab.      Interventions  Encouraged to attend Pulmonary Rehabilitation for the exercise  Encouraged to attend Pulmonary Rehabilitation for the exercise      Continue Psychosocial  Services   No Follow up required  No Follow up required         Psychosocial Discharge (Final Psychosocial Re-Evaluation): Psychosocial Re-Evaluation - 09/05/19 1417      Psychosocial Re-Evaluation   Current issues with  None Identified    Comments  Department closure x 7 weeks, will reopen 09/13/2019, will assess psychosocial barriers or concerns upon return to program    Expected Outcomes  Will continue to have no barriers or psychosocial concerns while in pulmonary rehab.    Interventions  Encouraged to attend Pulmonary Rehabilitation for the exercise    Continue Psychosocial Services   No Follow up required       Education: Education Goals: Education classes will be provided on a weekly basis, covering required topics. Participant will state understanding/return demonstration of topics presented.  Learning Barriers/Preferences:   Education Topics: Risk Factor Reduction:  -Group instruction that is supported by a PowerPoint presentation. Instructor discusses the definition of a risk factor, different risk factors for pulmonary disease, and how the heart and lungs work together.     Nutrition for Pulmonary Patient:  -Group instruction provided by PowerPoint slides, verbal discussion, and written materials to support subject matter. The instructor gives an explanation and review of healthy diet recommendations, which includes a discussion on weight management, recommendations for fruit and vegetable consumption, as well as protein, fluid, caffeine, fiber, sodium, sugar, and alcohol. Tips for eating when patients are short of breath are discussed.   Pursed Lip Breathing:  -Group instruction that is supported by demonstration and informational handouts. Instructor discusses the benefits of pursed lip and diaphragmatic breathing and detailed demonstration on how to preform both.     Oxygen Safety:  -Group instruction provided by PowerPoint, verbal discussion, and written material to  support subject matter. There is an overview of "What is Oxygen" and "Why do we need it".  Instructor also reviews how to create a safe environment for oxygen use, the importance of using oxygen as prescribed, and the risks of noncompliance. There is a brief discussion on traveling with oxygen and resources the patient may utilize.   Oxygen Equipment:  -Group instruction provided by [facility] Staff utilizing handouts, written materials, and equipment demonstrations.   Signs and Symptoms:  -Group instruction provided by written material and verbal discussion to support  subject matter. Warning signs and symptoms of infection, stroke, and heart attack are reviewed and when to call the physician/911 reinforced. Tips for preventing the spread of infection discussed.   Advanced Directives:  -Group instruction provided by verbal instruction and written material to support subject matter. Instructor reviews Advanced Directive laws and proper instruction for filling out document.   Pulmonary Video:  -Group video education that reviews the importance of medication and oxygen compliance, exercise, good nutrition, pulmonary hygiene, and pursed lip and diaphragmatic breathing for the pulmonary patient.   Exercise for the Pulmonary Patient:  -Group instruction that is supported by a PowerPoint presentation. Instructor discusses benefits of exercise, core components of exercise, frequency, duration, and intensity of an exercise routine, importance of utilizing pulse oximetry during exercise, safety while exercising, and options of places to exercise outside of rehab.     Pulmonary Medications:  -Verbally interactive group education provided by instructor with focus on inhaled medications and proper administration.   Anatomy and Physiology of the Respiratory System and Intimacy:  -Group instruction provided by PowerPoint, verbal discussion, and written material to support subject matter. Instructor  reviews respiratory cycle and anatomical components of the respiratory system and their functions. Instructor also reviews differences in obstructive and restrictive respiratory diseases with examples of each. Intimacy, Sex, and Sexuality differences are reviewed with a discussion on how relationships can change when diagnosed with pulmonary disease. Common sexual concerns are reviewed.   PULMONARY REHAB OTHER RESPIRATORY from 07/12/2019 in [place]  Date  06/21/19  Educator  -- [Handout]      MD DAY -A group question and answer session with a medical doctor that allows participants to ask questions that relate to their pulmonary disease state.   OTHER EDUCATION -Group or individual verbal, written, or video instructions that support the educational goals of the pulmonary rehab program.   Holiday Eating Survival Tips:  -Group instruction provided by PowerPoint slides, verbal discussion, and written materials to support subject matter. The instructor gives patients tips, tricks, and techniques to help them not only survive but enjoy the holidays despite the onslaught of food that accompanies the holidays.   Knowledge Questionnaire Score: Knowledge Questionnaire Score - 06/13/19 1137      Knowledge Questionnaire Score   Pre Score  16/18       Core Components/Risk Factors/Patient Goals at Admission: Personal Goals and Risk Factors at Admission - 07/04/19 1340      Core Components/Risk Factors/Patient Goals on Admission   Improve shortness of breath with ADL's  Yes    Intervention  Provide education, individualized exercise plan and daily activity instruction to help decrease symptoms of SOB with activities of daily living.    Expected Outcomes  Short Term: Improve cardiorespiratory fitness to achieve a reduction of symptoms when performing ADLs;Long Term: Be able to perform more ADLs without symptoms or delay the onset of symptoms       Core  Components/Risk Factors/Patient Goals Review:  Goals and Risk Factor Review    [place] Name 07/04/19 1340 09/05/19 1420           Core Components/Risk Factors/Patient Goals Review   Personal Goals Review  Develop more efficient breathing techniques such as purse lipped breathing and diaphragmatic breathing and practicing self-pacing with activity.;Increase knowledge of respiratory medications and ability to use respiratory devices properly.;Improve shortness of breath with ADL's  Weight Management/Obesity;Develop more efficient breathing techniques such as purse lipped breathing and diaphragmatic breathing and practicing self-pacing with  activity.;Increase knowledge of respiratory medications and ability to use respiratory devices properly.;Improve shortness of breath with ADL's      Review  Brent Holloway just started the program, has attended 4 exercise sessions, too early to have met any program goals.  Department closure x 7 weeks, will reopen 2/23 and continue working on his admission goals.      Expected Outcomes  See admission goals.  See admission goals.         Core Components/Risk Factors/Patient Goals at Discharge (Final Review):  Goals and Risk Factor Review - 09/05/19 1420      Core Components/Risk Factors/Patient Goals Review   Personal Goals Review  Weight Management/Obesity;Develop more efficient breathing techniques such as purse lipped breathing and diaphragmatic breathing and practicing self-pacing with activity.;Increase knowledge of respiratory medications and ability to use respiratory devices properly.;Improve shortness of breath with ADL's    Review  Department closure x 7 weeks, will reopen 2/23 and continue working on his admission goals.    Expected Outcomes  See admission goals.       ITP Comments:   Comments: ITP REVIEW Pt is making expected progress toward pulmonary rehab goals after completing 9 sessions. Recommend continued exercise, life style modification, education,  and utilization of breathing techniques to increase stamina and strength and decrease shortness of breath with exertion.

## 2019-09-13 ENCOUNTER — Other Ambulatory Visit: Payer: Self-pay

## 2019-09-13 ENCOUNTER — Telehealth: Payer: Self-pay | Admitting: Pulmonary Disease

## 2019-09-13 ENCOUNTER — Encounter (HOSPITAL_COMMUNITY)
Admission: RE | Admit: 2019-09-13 | Discharge: 2019-09-13 | Disposition: A | Discharge status: Home / Self Care | Payer: Managed Care, Other (non HMO) | Source: Ambulatory Visit | Attending: Pulmonary Disease | Admitting: Pulmonary Disease

## 2019-09-13 DIAGNOSIS — J84112 Idiopathic pulmonary fibrosis: Secondary | ICD-10-CM

## 2019-09-13 DIAGNOSIS — Z20828 Contact with and (suspected) exposure to other viral communicable diseases: Secondary | ICD-10-CM | POA: Diagnosis not present

## 2019-09-13 DIAGNOSIS — Z87891 Personal history of nicotine dependence: Secondary | ICD-10-CM | POA: Diagnosis not present

## 2019-09-13 DIAGNOSIS — J849 Interstitial pulmonary disease, unspecified: Secondary | ICD-10-CM | POA: Diagnosis not present

## 2019-09-13 MED ORDER — BREO ELLIPTA 200-25 MCG/INH IN AEPB: 1.0000 | INHALATION_SPRAY | Freq: Every day | RESPIRATORY_TRACT | 2 refills | Status: DC

## 2019-09-13 NOTE — Progress Notes (Signed)
Daily Session Note  Patient Details  Name: Brent Holloway MRN: [id number] Date of Birth: June 25, 1956 Referring Provider:     Pulmonary Rehab Walk Test from 06/13/2019 in [place]  Referring Provider  Dr. [name]      Encounter Date: 09/13/2019  Check In: Session Check In - 09/13/19 1427      Check-In   Supervising physician immediately available to respond to emergencies  [facility] immediately available    Physician(s)  Dr. Cyndia Skeeters    Location  [facility]    Staff Present  Rosebud Poles, RN, Bjorn Loser, MS, Exercise Physiologist;Lisa Ysidro Evert, RN    Virtual Visit  No    Medication changes reported      No    Fall or balance concerns reported     No    Tobacco Cessation  No Change    Warm-up and Cool-down  Performed on first and last piece of equipment    Resistance Training Performed  Yes    VAD Patient?  No    PAD/SET Patient?  No      Pain Assessment   Currently in Pain?  No/denies    Multiple Pain Sites  No       Capillary Blood Glucose: No results found for this or any previous visit (from the past 24 hour(s)).  Exercise Prescription Changes - 09/13/19 1500      Response to Exercise   Blood Pressure (Admit)  140/80    Blood Pressure (Exercise)  160/80    Blood Pressure (Exit)  128/70    Heart Rate (Admit)  81 bpm    Heart Rate (Exercise)  132 bpm    Heart Rate (Exit)  90 bpm    Oxygen Saturation (Admit)  99 %    Oxygen Saturation (Exercise)  84 %    Oxygen Saturation (Exit)  90 %    Rating of Perceived Exertion (Exercise)  13    Perceived Dyspnea (Exercise)  2    Duration  Progress to 30 minutes of  aerobic without signs/symptoms of physical distress    Intensity  THRR unchanged      Progression   Progression  Continue to progress workloads to maintain intensity without signs/symptoms of physical distress.      Resistance Training   Training Prescription  Yes    Weight  blue bands    Reps   10-15    Time  10 Minutes      Oxygen   Oxygen  Continuous    Liters  10-15      Treadmill   MPH  1.6    Grade  1    Minutes  15      NuStep   Level  3    SPM  80    Minutes  15    METs  2       Social History   Tobacco Use  Smoking Status Former Smoker  . Packs/day: 0.25  . Years: 44.00  . Pack years: 11.00  . Types: Cigars  . Start date: 57  . Quit date: 03/29/2017  . Years since quitting: 2.4  Smokeless Tobacco Never Used  Tobacco Comment   5-6 cigars daily     Goals Met:  Proper associated with RPD/PD & O2 Sat Exercise tolerated well Strength training completed today  Goals Unmet:  Not Applicable  Comments: Service time is from 1340 to 1455    Dr. Fransico Him is Medical Director  for Cardiac Rehab at [facility].

## 2019-09-13 NOTE — Telephone Encounter (Signed)
Called and spoke with patient. Patient stated he spoke with Dr. Everardo All last week, and he was seeing Brent John, NP as cpap follow up. Patient stated he is receiving his cpap from [place], Thursday 09/14/19. Patient rescheduled with Brian,NP 10/06/19 at 0900, telephone visit. Patient requested a refill of Breo to be sent to [facility]. Patient stated Virgel Bouquet helped and his [facility] providers agreed to continue. Breo prescription sent to [facility].  Nothing further at this time.

## 2019-09-15 ENCOUNTER — Other Ambulatory Visit: Payer: Self-pay

## 2019-09-15 ENCOUNTER — Encounter (HOSPITAL_COMMUNITY)
Admission: RE | Admit: 2019-09-15 | Discharge: 2019-09-15 | Disposition: A | Discharge status: Home / Self Care | Payer: Managed Care, Other (non HMO) | Source: Ambulatory Visit | Attending: Pulmonary Disease | Admitting: Pulmonary Disease

## 2019-09-15 ENCOUNTER — Ambulatory Visit: Payer: Managed Care, Other (non HMO) | Admitting: Pulmonary Disease

## 2019-09-15 DIAGNOSIS — J849 Interstitial pulmonary disease, unspecified: Secondary | ICD-10-CM

## 2019-09-15 NOTE — Progress Notes (Addendum)
Daily Session Note  Patient Details  Name: Brent Holloway MRN: [id number] Date of Birth: [birth year] Referring Provider:     Pulmonary Rehab Walk Test from 06/13/2019 in [place]  Referring Provider  Dr. [name]      Encounter Date: 09/15/2019  Check In: Session Check In - 09/15/19 1418      Check-In   Supervising physician immediately available to respond to emergencies  [facility] immediately available    Physician(s)  Dr. Cyndia Skeeters    Location  [facility]    Staff Present  Rosebud Poles, RN, Bjorn Loser, MS, Exercise Physiologist;Terris Germano Ysidro Evert, RN    Virtual Visit  No    Medication changes reported      No    Fall or balance concerns reported     No    Tobacco Cessation  No Change    Warm-up and Cool-down  Performed as group-led instruction    Resistance Training Performed  Yes    VAD Patient?  No    PAD/SET Patient?  No      Pain Assessment   Currently in Pain?  No/denies       Capillary Blood Glucose: No results found for this or any previous visit (from the past 24 hour(s)).    Social History   Tobacco Use  Smoking Status Former Smoker  . Packs/day: 0.25  . Years: 44.00  . Pack years: 11.00  . Types: Cigars  . Start date: 40  . Quit date: 03/29/2017  . Years since quitting: 2.4  Smokeless Tobacco Never Used  Tobacco Comment   5-6 cigars daily     Goals Met:  Exercise tolerated well No report of cardiac concerns or symptoms Strength training completed today  Goals Unmet:  Not Applicable  Comments: Service time is from 1335 to 1455   Dr. Fransico Him is Medical Director for Cardiac Rehab at [facility].

## 2019-09-20 ENCOUNTER — Other Ambulatory Visit: Payer: Self-pay

## 2019-09-20 ENCOUNTER — Encounter (HOSPITAL_COMMUNITY)
Admission: RE | Admit: 2019-09-20 | Discharge: 2019-09-20 | Disposition: A | Discharge status: Home / Self Care | Payer: Managed Care, Other (non HMO) | Source: Ambulatory Visit | Attending: Pulmonary Disease | Admitting: Pulmonary Disease

## 2019-09-20 DIAGNOSIS — Z87891 Personal history of nicotine dependence: Secondary | ICD-10-CM | POA: Insufficient documentation

## 2019-09-20 DIAGNOSIS — J849 Interstitial pulmonary disease, unspecified: Secondary | ICD-10-CM | POA: Insufficient documentation

## 2019-09-20 DIAGNOSIS — Z20828 Contact with and (suspected) exposure to other viral communicable diseases: Secondary | ICD-10-CM | POA: Insufficient documentation

## 2019-09-20 NOTE — Progress Notes (Signed)
Daily Session Note  Patient Details  Name: Brent Holloway MRN: [id number] Date of Birth: 10-Jan-1956 Referring Provider:     Pulmonary Rehab Walk Test from 06/13/2019 in [place]  Referring Provider  Dr. [name]      Encounter Date: 09/20/2019  Check In: Session Check In - 09/20/19 1541      Check-In   Supervising physician immediately available to respond to emergencies  [facility] immediately available    Physician(s)  Dr. Doristine Bosworth    Location  [facility]    Staff Present  Rosebud Poles, RN, Bjorn Loser, MS, Exercise Physiologist;Olinty Celesta Aver, MS, ACSM CEP, Exercise Physiologist    Virtual Visit  No    Medication changes reported      No    Fall or balance concerns reported     No    Tobacco Cessation  No Change    Warm-up and Cool-down  Performed on first and last piece of equipment    Resistance Training Performed  Yes    VAD Patient?  No    PAD/SET Patient?  No      Pain Assessment   Currently in Pain?  No/denies    Multiple Pain Sites  No       Capillary Blood Glucose: No results found for this or any previous visit (from the past 24 hour(s)).    Social History   Tobacco Use  Smoking Status Former Smoker  . Packs/day: 0.25  . Years: 44.00  . Pack years: 11.00  . Types: Cigars  . Start date: 85  . Quit date: 03/29/2017  . Years since quitting: 2.4  Smokeless Tobacco Never Used  Tobacco Comment   5-6 cigars daily     Goals Met:  Independence with exercise equipment Exercise tolerated well Strength training completed today  Goals Unmet:  Not Applicable  Comments: Service time is from 1348 to 1440    Dr. Fransico Him is Medical Director for Cardiac Rehab at [facility].

## 2019-09-21 ENCOUNTER — Telehealth: Payer: Self-pay | Admitting: Pulmonary Disease

## 2019-09-21 DIAGNOSIS — Z5181 Encounter for therapeutic drug level monitoring: Secondary | ICD-10-CM

## 2019-09-21 NOTE — Telephone Encounter (Signed)
Pneumovax23 would be my recommendation. At 36 I would currently recommend Prevnar13.   Elisha Headland FNP

## 2019-09-21 NOTE — Telephone Encounter (Signed)
Spoke with patient. He wanted to follow up on getting labwork for Ofev. Reviewed patient's chart, it appears that Dr. Everardo All wanted him to have a cbc and cmp every month for 6 months starting in January 2021. I advised patient that he could come in at any time (except for tomorrow since we will not have a lab tech) for the labs. He verbalized understanding.   While on the phone, he also stated that he wanted to get the PNA vaccine while he was here for the labs. He stated that he has never received one before and it was recommended from the [facility]. He stated they did specify which one he should receive. I advised him to be on the safe side, I would double check. He verbalized understanding.   Arlys John, he will be seeing you on 10/06/19 for a cpap follow up. Which PNA vaccine would you recommend for him? Thanks!

## 2019-09-22 ENCOUNTER — Other Ambulatory Visit: Payer: Self-pay

## 2019-09-22 ENCOUNTER — Encounter (HOSPITAL_COMMUNITY)
Admission: RE | Admit: 2019-09-22 | Discharge: 2019-09-22 | Disposition: A | Discharge status: Home / Self Care | Payer: Managed Care, Other (non HMO) | Source: Ambulatory Visit | Attending: Pulmonary Disease | Admitting: Pulmonary Disease

## 2019-09-22 DIAGNOSIS — J849 Interstitial pulmonary disease, unspecified: Secondary | ICD-10-CM

## 2019-09-22 NOTE — Telephone Encounter (Signed)
Nothing noted in encounter. Will sign off as an error.  

## 2019-09-22 NOTE — Telephone Encounter (Signed)
Called the patient and made him aware of the response. Patient voiced understanding. Nothing further needed at this time.

## 2019-09-22 NOTE — Telephone Encounter (Signed)
Called and spoke to pt. Informed him of the recs per BPM. Pt had upcoming appt with BPM for CPAP, however, pt has only been on CPAP for 7 days. OV canceled with Arlys John and kept appt with Dr. Everardo All scheduled for 3/29. Pt aware.   Pt states he is getting his COVID vaccine in 3 weeks and wants to be sure getting the PNA23 isnt contraindicated. Pt has been preemptively scheduled for the PNA23 vaccine for tomorrow 3/5 at 0930.    Arlys John, please advise if ok for pt to get his PNA23 vaccine tomorrow if he gets the COVID vaccine in 3 weeks. Thanks.

## 2019-09-22 NOTE — Telephone Encounter (Signed)
Yes that will be fine.  Brent Headland, FNP

## 2019-09-22 NOTE — Progress Notes (Signed)
Daily Session Note  Patient Details  Name: Brent Holloway MRN: [id number] Date of Birth: [birth year] Referring Provider:     Pulmonary Rehab Walk Test from 06/13/2019 in [place]  Referring Provider  Dr. [name]      Encounter Date: 09/22/2019  Check In: Session Check In - 09/22/19 1407      Check-In   Supervising physician immediately available to respond to emergencies  [facility] immediately available    Physician(s)  Dr. Broadus John    Location  [facility]    Staff Present  Rosebud Poles, RN, Bjorn Loser, MS, Exercise Physiologist    Virtual Visit  No    Medication changes reported      No    Fall or balance concerns reported     No    Tobacco Cessation  No Change    Warm-up and Cool-down  Performed as group-led instruction    Resistance Training Performed  Yes    VAD Patient?  No    PAD/SET Patient?  No      Pain Assessment   Currently in Pain?  No/denies    Multiple Pain Sites  No       Capillary Blood Glucose: No results found for this or any previous visit (from the past 24 hour(s)).    Social History   Tobacco Use  Smoking Status Former Smoker  . Packs/day: 0.25  . Years: 44.00  . Pack years: 11.00  . Types: Cigars  . Start date: 51  . Quit date: 03/29/2017  . Years since quitting: 2.4  Smokeless Tobacco Never Used  Tobacco Comment   5-6 cigars daily     Goals Met:  Independence with exercise equipment Exercise tolerated well Strength training completed today  Goals Unmet:  Not Applicable  Comments: Service time is from 1340 to 1445    Dr. Rush Farmer is Medical Director for Pulmonary Rehab at [facility].

## 2019-09-23 ENCOUNTER — Ambulatory Visit (INDEPENDENT_AMBULATORY_CARE_PROVIDER_SITE_OTHER): Payer: Managed Care, Other (non HMO)

## 2019-09-23 DIAGNOSIS — Z5181 Encounter for therapeutic drug level monitoring: Secondary | ICD-10-CM

## 2019-09-23 DIAGNOSIS — Z23 Encounter for immunization: Secondary | ICD-10-CM

## 2019-09-23 LAB — CBC WITH DIFFERENTIAL/PLATELET
Basophils Absolute: 0.1 10*3/uL (ref 0.0–0.1)
Basophils Relative: 1.1 % (ref 0.0–3.0)
Eosinophils Absolute: 0.2 10*3/uL (ref 0.0–0.7)
Eosinophils Relative: 2.9 % (ref 0.0–5.0)
HCT: 41.2 % (ref 39.0–52.0)
Hemoglobin: 14.5 g/dL (ref 13.0–17.0)
Lymphocytes Relative: 17.1 % (ref 12.0–46.0)
Lymphs Abs: 1 10*3/uL (ref 0.7–4.0)
MCHC: 35.1 g/dL (ref 30.0–36.0)
MCV: 85.4 fl (ref 78.0–100.0)
Monocytes Absolute: 0.7 10*3/uL (ref 0.1–1.0)
Monocytes Relative: 12.4 % — ABNORMAL HIGH (ref 3.0–12.0)
Neutro Abs: 4 10*3/uL (ref 1.4–7.7)
Neutrophils Relative %: 66.5 % (ref 43.0–77.0)
Platelets: 230 10*3/uL (ref 150.0–400.0)
RBC: 4.82 Mil/uL (ref 4.22–5.81)
RDW: 14.3 % (ref 11.5–15.5)
WBC: 5.9 10*3/uL (ref 4.0–10.5)

## 2019-09-23 LAB — COMPREHENSIVE METABOLIC PANEL WITH GFR
ALT: 19 U/L (ref 0–53)
AST: 20 U/L (ref 0–37)
Albumin: 4.2 g/dL (ref 3.5–5.2)
Alkaline Phosphatase: 69 U/L (ref 39–117)
BUN: 19 mg/dL (ref 6–23)
CO2: 26 meq/L (ref 19–32)
Calcium: 9.6 mg/dL (ref 8.4–10.5)
Chloride: 102 meq/L (ref 96–112)
Creatinine, Ser: 1.04 mg/dL (ref 0.40–1.50)
GFR: 71.9 mL/min
Glucose, Bld: 88 mg/dL (ref 70–99)
Potassium: 4.5 meq/L (ref 3.5–5.1)
Sodium: 136 meq/L (ref 135–145)
Total Bilirubin: 1 mg/dL (ref 0.2–1.2)
Total Protein: 7.1 g/dL (ref 6.0–8.3)

## 2019-09-27 ENCOUNTER — Other Ambulatory Visit: Payer: Self-pay

## 2019-09-27 ENCOUNTER — Encounter (HOSPITAL_COMMUNITY)
Admission: RE | Admit: 2019-09-27 | Discharge: 2019-09-27 | Disposition: A | Discharge status: Home / Self Care | Payer: Managed Care, Other (non HMO) | Source: Ambulatory Visit | Attending: Pulmonary Disease | Admitting: Pulmonary Disease

## 2019-09-27 VITALS — Wt 269.4 lb

## 2019-09-27 DIAGNOSIS — J849 Interstitial pulmonary disease, unspecified: Secondary | ICD-10-CM

## 2019-09-27 NOTE — Progress Notes (Signed)
Daily Session Note  Patient Details  Name: Brent Holloway MRN: [id number] Date of Birth: Mar 20, 1956 Referring Provider:     Pulmonary Rehab Walk Test from 06/13/2019 in [place]  Referring Provider  Dr. [name]      Encounter Date: 09/27/2019  Check In: Session Check In - 09/27/19 1353      Check-In   Supervising physician immediately available to respond to emergencies  [facility] immediately available    Physician(s)  Dr. Broadus John    Location  [facility]    Staff Present  Rosebud Poles, RN, Bjorn Loser, MS, Exercise Physiologist;Lisa Ysidro Evert, RN    Virtual Visit  No    Medication changes reported      No    Fall or balance concerns reported     No    Tobacco Cessation  No Change    Warm-up and Cool-down  Performed as group-led instruction    Resistance Training Performed  Yes    VAD Patient?  No    PAD/SET Patient?  No      Pain Assessment   Currently in Pain?  No/denies    Multiple Pain Sites  No       Capillary Blood Glucose: No results found for this or any previous visit (from the past 24 hour(s)).  Exercise Prescription Changes - 09/27/19 1500      Response to Exercise   Blood Pressure (Admit)  122/80    Blood Pressure (Exercise)  138/76    Blood Pressure (Exit)  110/72    Heart Rate (Admit)  79 bpm    Heart Rate (Exercise)  122 bpm    Heart Rate (Exit)  89 bpm    Oxygen Saturation (Admit)  100 %    Oxygen Saturation (Exercise)  92 %    Oxygen Saturation (Exit)  97 %    Rating of Perceived Exertion (Exercise)  12    Perceived Dyspnea (Exercise)  2    Duration  Progress to 30 minutes of  aerobic without signs/symptoms of physical distress    Intensity  THRR unchanged      Progression   Progression  Continue to progress workloads to maintain intensity without signs/symptoms of physical distress.      Resistance Training   Training Prescription  Yes    Weight  blue bands    Reps  10-15    Time   10 Minutes      Oxygen   Oxygen  Continuous    Liters  10-15      Treadmill   MPH  1.8    Grade  2    Minutes  15      NuStep   Level  4    SPM  80    Minutes  15    METs  2.3       Social History   Tobacco Use  Smoking Status Former Smoker  . Packs/day: 0.25  . Years: 44.00  . Pack years: 11.00  . Types: Cigars  . Start date: 64  . Quit date: 03/29/2017  . Years since quitting: 2.4  Smokeless Tobacco Never Used  Tobacco Comment   5-6 cigars daily     Goals Met:  Independence with exercise equipment Exercise tolerated well Strength training completed today  Goals Unmet:  Not Applicable  Comments: Service time is from 1335 to 1433    Dr. Fransico Him is Medical Director for Cardiac Rehab at [facility].

## 2019-09-29 ENCOUNTER — Encounter (HOSPITAL_COMMUNITY)
Admission: RE | Admit: 2019-09-29 | Discharge: 2019-09-29 | Disposition: A | Discharge status: Home / Self Care | Payer: Managed Care, Other (non HMO) | Source: Ambulatory Visit | Attending: Pulmonary Disease | Admitting: Pulmonary Disease

## 2019-09-29 ENCOUNTER — Other Ambulatory Visit: Payer: Self-pay

## 2019-09-29 DIAGNOSIS — J849 Interstitial pulmonary disease, unspecified: Secondary | ICD-10-CM

## 2019-09-29 NOTE — Progress Notes (Signed)
Daily Session Note  Patient Details  Name: Brent Holloway MRN: [id number] Date of Birth: January 26, 1956 Referring Provider:     Pulmonary Rehab Walk Test from 06/13/2019 in [place]  Referring Provider  Dr. [name]      Encounter Date: 09/29/2019  Check In: Session Check In - 09/29/19 1344      Check-In   Supervising physician immediately available to respond to emergencies  [facility] immediately available    Physician(s)  Dr. Maylene Roes    Location  [facility]    Staff Present  Rosebud Poles, RN, Bjorn Loser, MS, Exercise Physiologist;Lisa Ysidro Evert, RN    Virtual Visit  No    Medication changes reported      No    Fall or balance concerns reported     No    Tobacco Cessation  No Change    Warm-up and Cool-down  Performed on first and last piece of equipment    Resistance Training Performed  Yes    VAD Patient?  No    PAD/SET Patient?  No      Pain Assessment   Currently in Pain?  No/denies    Multiple Pain Sites  No       Capillary Blood Glucose: No results found for this or any previous visit (from the past 24 hour(s)).    Social History   Tobacco Use  Smoking Status Former Smoker  . Packs/day: 0.25  . Years: 44.00  . Pack years: 11.00  . Types: Cigars  . Start date: 89  . Quit date: 03/29/2017  . Years since quitting: 2.5  Smokeless Tobacco Never Used  Tobacco Comment   5-6 cigars daily     Goals Met:  Independence with exercise equipment Exercise tolerated well Strength training completed today  Goals Unmet:  Not Applicable  Comments: Service time is from 1345 to 1445    Dr. Fransico Him is Medical Director for Cardiac Rehab at [facility].

## 2019-10-04 ENCOUNTER — Other Ambulatory Visit: Payer: Self-pay

## 2019-10-04 ENCOUNTER — Encounter (HOSPITAL_COMMUNITY)
Admission: RE | Admit: 2019-10-04 | Discharge: 2019-10-04 | Disposition: A | Discharge status: Home / Self Care | Payer: Managed Care, Other (non HMO) | Source: Ambulatory Visit | Attending: Pulmonary Disease | Admitting: Pulmonary Disease

## 2019-10-04 DIAGNOSIS — J849 Interstitial pulmonary disease, unspecified: Secondary | ICD-10-CM | POA: Diagnosis not present

## 2019-10-04 NOTE — Progress Notes (Signed)
Daily Session Note  Patient Details  Name: Brent Holloway MRN: [id number] Date of Birth: Apr 08, 1956 Referring Provider:     Pulmonary Rehab Walk Test from 06/13/2019 in [place]  Referring Provider  Dr. [name]      Encounter Date: 10/04/2019  Check In: Session Check In - 10/04/19 1402      Check-In   Supervising physician immediately available to respond to emergencies  [facility] immediately available    Physician(s)  Dr. Dessa Phi    Location  [facility]    Staff Present  Rosebud Poles, RN, Bjorn Loser, MS, Exercise Physiologist;Song Garris Ysidro Evert, RN    Virtual Visit  No    Medication changes reported      No    Fall or balance concerns reported     No    Tobacco Cessation  No Change    Warm-up and Cool-down  Performed as group-led instruction    Resistance Training Performed  Yes    VAD Patient?  No    PAD/SET Patient?  No      Pain Assessment   Currently in Pain?  No/denies    Multiple Pain Sites  No       Capillary Blood Glucose: No results found for this or any previous visit (from the past 24 hour(s)).    Social History   Tobacco Use  Smoking Status Former Smoker  . Packs/day: 0.25  . Years: 44.00  . Pack years: 11.00  . Types: Cigars  . Start date: 45  . Quit date: 03/29/2017  . Years since quitting: 2.5  Smokeless Tobacco Never Used  Tobacco Comment   5-6 cigars daily     Goals Met:  Exercise tolerated well No report of cardiac concerns or symptoms Strength training completed today  Goals Unmet:  Not Applicable  Comments: Service time is from 1350 to 1447    Dr. Fransico Him is Medical Director for Cardiac Rehab at [facility].

## 2019-10-06 ENCOUNTER — Other Ambulatory Visit: Payer: Self-pay

## 2019-10-06 ENCOUNTER — Encounter (HOSPITAL_COMMUNITY)
Admission: RE | Admit: 2019-10-06 | Discharge: 2019-10-06 | Disposition: A | Discharge status: Home / Self Care | Payer: Managed Care, Other (non HMO) | Source: Ambulatory Visit | Attending: Pulmonary Disease | Admitting: Pulmonary Disease

## 2019-10-06 ENCOUNTER — Ambulatory Visit: Payer: Managed Care, Other (non HMO) | Admitting: Pulmonary Disease

## 2019-10-06 DIAGNOSIS — J849 Interstitial pulmonary disease, unspecified: Secondary | ICD-10-CM

## 2019-10-06 NOTE — Progress Notes (Signed)
Daily Session Note  Patient Details  Name: Adham Johnson MRN: [id number] Date of Birth: Feb 13, 1956 Referring Provider:     Pulmonary Rehab Walk Test from 06/13/2019 in [place]  Referring Provider  Dr. [name]      Encounter Date: 10/06/2019  Check In: Session Check In - 10/06/19 [place]      Check-In   Supervising physician immediately available to respond to emergencies  [facility] immediately available    Physician(s)  Dr. Darrick Meigs    Location  [facility]    Staff Present  Rosebud Poles, RN, Bjorn Loser, MS, Exercise Physiologist;Caydence Enck Ysidro Evert, RN    Virtual Visit  No    Medication changes reported      No    Fall or balance concerns reported     No    Tobacco Cessation  No Change    Warm-up and Cool-down  Performed as group-led instruction    Resistance Training Performed  Yes    VAD Patient?  No    PAD/SET Patient?  No      Pain Assessment   Currently in Pain?  No/denies    Multiple Pain Sites  No       Capillary Blood Glucose: No results found for this or any previous visit (from the past 24 hour(s)).    Social History   Tobacco Use  Smoking Status Former Smoker  . Packs/day: 0.25  . Years: 44.00  . Pack years: 11.00  . Types: Cigars  . Start date: 82  . Quit date: 03/29/2017  . Years since quitting: 2.5  Smokeless Tobacco Never Used  Tobacco Comment   5-6 cigars daily     Goals Met:  Exercise tolerated well No report of cardiac concerns or symptoms Strength training completed today  Goals Unmet:  Not Applicable  Comments: Service time is from 1315 to 1417    Dr. Fransico Him is Medical Director for Cardiac Rehab at [facility].

## 2019-10-06 NOTE — Progress Notes (Signed)
I have reviewed a Home Exercise Prescription with Brent Holloway is  currently exercising at home.  The patient was advised to walk 3 days a week for 30-45 minutes.  [name] and I discussed how to progress their exercise prescription.  The patient stated that their goals were to get in condition for [facility] lung transplant.  The patient stated that they understand the exercise prescription.  We reviewed exercise guidelines, target heart rate during exercise, RPE Scale, weather conditions, NTG use, endpoints for exercise, warmup and cool down.  Patient is encouraged to come to me with any questions. I will continue to follow up with the patient to assist them with progression and safety.

## 2019-10-11 ENCOUNTER — Encounter (HOSPITAL_COMMUNITY)
Admission: RE | Admit: 2019-10-11 | Discharge: 2019-10-11 | Disposition: A | Discharge status: Home / Self Care | Payer: Managed Care, Other (non HMO) | Source: Ambulatory Visit | Attending: Pulmonary Disease | Admitting: Pulmonary Disease

## 2019-10-11 ENCOUNTER — Other Ambulatory Visit: Payer: Self-pay

## 2019-10-11 VITALS — Wt 265.9 lb

## 2019-10-11 DIAGNOSIS — J849 Interstitial pulmonary disease, unspecified: Secondary | ICD-10-CM

## 2019-10-11 NOTE — Progress Notes (Signed)
Daily Session Note  Patient Details  Name: Brent Holloway MRN: [id number] Date of Birth: Nov 05, 1955 Referring Provider:     Pulmonary Rehab Walk Test from 06/13/2019 in [place]  Referring Provider  Dr. [name]      Encounter Date: 10/11/2019  Check In: Session Check In - 10/11/19 1335      Check-In   Supervising physician immediately available to respond to emergencies  [facility] immediately available    Physician(s)  Dr. Darrick Meigs    Location  [facility]    Staff Present  Rosebud Poles, RN, BSN;Zaiyah Sottile Ysidro Evert, RN;Dalton Fletcher, MS, Exercise Physiologist    Virtual Visit  No    Medication changes reported      No    Fall or balance concerns reported     No    Tobacco Cessation  No Change    Warm-up and Cool-down  Performed as group-led instruction    Resistance Training Performed  Yes    VAD Patient?  No    PAD/SET Patient?  No      Pain Assessment   Currently in Pain?  No/denies    Multiple Pain Sites  No       Capillary Blood Glucose: No results found for this or any previous visit (from the past 24 hour(s)).  Exercise Prescription Changes - 10/11/19 1400      Response to Exercise   Blood Pressure (Admit)  142/84    Blood Pressure (Exercise)  144/70    Blood Pressure (Exit)  120/70    Heart Rate (Admit)  74 bpm    Heart Rate (Exercise)  112 bpm    Heart Rate (Exit)  86 bpm    Oxygen Saturation (Admit)  100 %    Oxygen Saturation (Exercise)  91 %    Oxygen Saturation (Exit)  99 %    Rating of Perceived Exertion (Exercise)  13    Perceived Dyspnea (Exercise)  2    Duration  Continue with 30 min of aerobic exercise without signs/symptoms of physical distress.    Intensity  THRR unchanged      Progression   Progression  Continue to progress workloads to maintain intensity without signs/symptoms of physical distress.      Resistance Training   Training Prescription  Yes    Weight  blue bands    Reps  10-15    Time  10 Minutes      Oxygen   Oxygen  Continuous    Liters  10-15      Treadmill   MPH  1.9    Grade  2    Minutes  15      NuStep   Level  5    SPM  80    Minutes  15    METs  2.6       Social History   Tobacco Use  Smoking Status Former Smoker  . Packs/day: 0.25  . Years: 44.00  . Pack years: 11.00  . Types: Cigars  . Start date: 16  . Quit date: 03/29/2017  . Years since quitting: 2.5  Smokeless Tobacco Never Used  Tobacco Comment   5-6 cigars daily     Goals Met:  Exercise tolerated well No report of cardiac concerns or symptoms Strength training completed today  Goals Unmet:  Not Applicable  Comments: Service time is from 1315 to 1414    Dr. Fransico Him is Medical Director for Cardiac Rehab at [facility].

## 2019-10-11 NOTE — Progress Notes (Signed)
Pulmonary Individual Treatment Plan  Patient Details  Name: Brent Holloway MRN: [id number] Date of Birth: 04/07/1956 Referring Provider:     Pulmonary Rehab Walk Test from 06/13/2019 in [facility]  Referring Provider  Dr. [name]      Initial Encounter Date:    Pulmonary Rehab Walk Test from 06/13/2019 in [facility]  Date  06/13/19      Visit Diagnosis: Interstitial lung disease ([place])  Patient's Home Medications on Admission:   Current Outpatient Medications:  .  cetirizine (ZYRTEC) 10 MG tablet, Take 10 mg by mouth daily., Disp: , Rfl:  .  fluticasone furoate-vilanterol (BREO ELLIPTA) 200-25 MCG/INH AEPB, Inhale 1 puff into the lungs daily., Disp: 60 each, Rfl: 2 .  Multiple Vitamins-Minerals (MULTIVITAMIN WITH MINERALS) tablet, Take 1 tablet by mouth daily., Disp: , Rfl:  .  Nintedanib (OFEV) 150 MG CAPS, Take 1 capsule (150 mg total) by mouth 2 (two) times daily. (Patient not taking: Reported on 08/11/2019), Disp: 60 capsule, Rfl: 2 .  Omega-3 Fatty Acids (FISH OIL) 1000 MG CAPS, Take 1 capsule by mouth daily., Disp: , Rfl:  .  omeprazole (PRILOSEC) 20 MG capsule, Take 1 capsule (20 mg total) by mouth daily., Disp: 30 capsule, Rfl: 6 .  predniSONE (DELTASONE) 10 MG tablet, Take 6 tablets x three days (60 mg), followed by 5 tablets x three days (74m), then 4 tablets x three day(438m, then 3 tablets (3073mx three days, then 2 tablets (46m63m three days, then 1 tablet (10mg38mthree days, then STOP, Disp: 63 tablet, Rfl: 0 .  XARELTO 20 MG TABS tablet, Take 20 mg by mouth daily., Disp: , Rfl: 2  Past Medical History: Past Medical History:  Diagnosis Date  . DVT (deep venous thrombosis) (HCC) [place] Factor 5 Leiden mutation, heterozygous (HCC) [place] Tobacco Use: Social History   Tobacco Use  Smoking Status Former Smoker  . Packs/day: 0.25  . Years: 44.00  . Pack years: 11.00  . Types: Cigars  . Start date: 1974 19Quit date: 03/29/2017  . Years since quitting: 2.5  Smokeless Tobacco Never Used  Tobacco Comment   5-6 cigars daily     Labs: Recent Review Flowsheet Data    There is no flowsheet data to display.      Capillary Blood Glucose: Lab Results  Component Value Date   GLUCAP 96 08/09/2019     Pulmonary Assessment Scores: Pulmonary Assessment Scores    Row Name 06/13/19 1123 06/13/19 1216       ADL UCSD   ADL Phase  Entry  Entry    SOB Score total  58  --      CAT Score   CAT Score  17  --      mMRC Score   mMRC Score  --  4      UCSD: Self-administered rating of dyspnea associated with activities of daily living (ADLs) 6-point scale (0 = "not at all" to 5 = "maximal or unable to do because of breathlessness")  Scoring Scores range from 0 to 120.  Minimally important difference is 5 units  CAT: CAT can identify the health impairment of COPD patients and is better correlated with disease progression.  CAT has a scoring range of zero to 40. The CAT score is classified into four groups of low (less than 10), medium (10 - 20), high (21-30) and very high (31-40) based on  the impact level of disease on health status. A CAT score over 10 suggests significant symptoms.  A worsening CAT score could be explained by an exacerbation, poor medication adherence, poor inhaler technique, or progression of COPD or comorbid conditions.  CAT MCID is 2 points  mMRC: mMRC (Modified Medical Research Council) Dyspnea Scale is used to assess the degree of baseline functional disability in patients of respiratory disease due to dyspnea. No minimal important difference is established. A decrease in score of 1 point or greater is considered a positive change.   Pulmonary Function Assessment: Pulmonary Function Assessment - 06/13/19 1123      Breath   Shortness of Breath  Yes;Limiting activity       Exercise Target Goals: Exercise Program Goal: Individual exercise prescription set using  results from initial 6 min walk test and THRR while considering  patient's activity barriers and safety.   Exercise Prescription Goal: Initial exercise prescription builds to 30-45 minutes a day of aerobic activity, 2-3 days per week.  Home exercise guidelines will be given to patient during program as part of exercise prescription that the participant will acknowledge.  Activity Barriers & Risk Stratification: Activity Barriers & Cardiac Risk Stratification - 06/13/19 1117      Activity Barriers & Cardiac Risk Stratification   Activity Barriers  Back Problems;Arthritis;Deconditioning;Muscular Weakness;Shortness of Breath       6 Minute Walk: 6 Minute Walk    Row Name 06/13/19 1217 07/05/19 1637       6 Minute Walk   Phase  Initial  Mid Program    Distance  659 feet  800 feet    Walk Time  5 minutes  5 minutes    # of Rest Breaks  1  1    MPH  1.25  1.52    METS  2.01  2.35    RPE  12  13    Perceived Dyspnea   2  1    VO2 Peak  7.05  8.22    Symptoms  Yes (comment)  Yes (comment)    Comments  1 min standing rest due to desat  1 min standing rest due to desat    Resting HR  75 bpm  91 bpm    Resting BP  144/90  128/74    Resting Oxygen Saturation   100 %  98 %    Exercise Oxygen Saturation  during 6 min walk  80 %  83 %    Max Ex. HR  121 bpm  127 bpm    Max Ex. BP  164/92  162/60    2 Minute Post BP  130/78  128/70      Interval HR   1 Minute HR  106  108    2 Minute HR  117  127    3 Minute HR  111  96    4 Minute HR  101  109    5 Minute HR  114  116    6 Minute HR  121  121    2 Minute Post HR  97  101    Interval Heart Rate?  Yes  Yes      Interval Oxygen   Interval Oxygen?  Yes  Yes    Baseline Oxygen Saturation %  100 %  98 %    1 Minute Oxygen Saturation %  98 %  95 %    1 Minute Liters of Oxygen  3 L  4 L  2 Minute Oxygen Saturation %  81 %  83 %    2 Minute Liters of Oxygen  3 L  4 L    3 Minute Oxygen Saturation %  80 %  89 %    3 Minute Liters of  Oxygen  4 L  6 L    4 Minute Oxygen Saturation %  96 %  94 %    4 Minute Liters of Oxygen  4 L  6 L    5 Minute Oxygen Saturation %  93 %  91 %    5 Minute Liters of Oxygen  4 L  6 L    6 Minute Oxygen Saturation %  86 %  87 %    6 Minute Liters of Oxygen  4 L  6 L    2 Minute Post Oxygen Saturation %  91 %  98 %    2 Minute Post Liters of Oxygen  3 L  6 L       Oxygen Initial Assessment: Oxygen Initial Assessment - 06/13/19 1215      Home Oxygen   Home Oxygen Device  Portable Concentrator;E-Tanks    Sleep Oxygen Prescription  Continuous    Liters per minute  3    Home Exercise Oxygen Prescription  Continuous    Liters per minute  3    Home at Rest Exercise Oxygen Prescription  Continuous    Liters per minute  3    Compliance with Home Oxygen Use  Yes      Initial 6 min Walk   Oxygen Used  Continuous    Liters per minute  4      Program Oxygen Prescription   Program Oxygen Prescription  Continuous    Liters per minute  4    Comments  may need to increase to 6      Intervention   Short Term Goals  To learn and exhibit compliance with exercise, home and travel O2 prescription;To learn and understand importance of monitoring SPO2 with pulse oximeter and demonstrate accurate use of the pulse oximeter.;To learn and understand importance of maintaining oxygen saturations>88%;To learn and demonstrate proper pursed lip breathing techniques or other breathing techniques.;To learn and demonstrate proper use of respiratory medications    Long  Term Goals  Exhibits compliance with exercise, home and travel O2 prescription;Verbalizes importance of monitoring SPO2 with pulse oximeter and return demonstration;Maintenance of O2 saturations>88%;Exhibits proper breathing techniques, such as pursed lip breathing or other method taught during program session;Compliance with respiratory medication       Oxygen Re-Evaluation: Oxygen Re-Evaluation    Row Name 07/04/19 1007 09/06/19 0915 10/11/19  0828         Program Oxygen Prescription   Program Oxygen Prescription  Continuous  Continuous  Continuous     Liters per minute  _0 Comments  4 L while seated and 8 L on TM  4 L while seated and 8 L on TM  8 while seated and 15 on TM       Home Oxygen   Home Oxygen Device  Portable Concentrator;E-Tanks  Portable Concentrator;E-Tanks  Portable Concentrator;E-Tanks     Sleep Oxygen Prescription  Continuous  Continuous  Continuous     Liters per minute  _1 Home Exercise Oxygen Prescription  Continuous  Continuous  Continuous     Liters per minute  8  8  15  Home at Rest Exercise Oxygen Prescription  Continuous  Continuous  Continuous     Liters per minute  _0 Compliance with Home Oxygen Use  No Pt routinely walks in to rehab from car on 2 L and sats <80%  No  Yes       Goals/Expected Outcomes   Short Term Goals  To learn and exhibit compliance with exercise, home and travel O2 prescription;To learn and understand importance of monitoring SPO2 with pulse oximeter and demonstrate accurate use of the pulse oximeter.;To learn and understand importance of maintaining oxygen saturations>88%;To learn and demonstrate proper pursed lip breathing techniques or other breathing techniques.;To learn and demonstrate proper use of respiratory medications  To learn and exhibit compliance with exercise, home and travel O2 prescription;To learn and understand importance of monitoring SPO2 with pulse oximeter and demonstrate accurate use of the pulse oximeter.;To learn and understand importance of maintaining oxygen saturations>88%;To learn and demonstrate proper pursed lip breathing techniques or other breathing techniques.;To learn and demonstrate proper use of respiratory medications  To learn and exhibit compliance with exercise, home and travel O2 prescription;To learn and understand importance of monitoring SPO2 with pulse oximeter and demonstrate accurate use of the pulse  oximeter.;To learn and demonstrate proper pursed lip breathing techniques or other breathing techniques.;To learn and demonstrate proper use of respiratory medications;To learn and understand importance of maintaining oxygen saturations>88%     Long  Term Goals  Exhibits compliance with exercise, home and travel O2 prescription;Verbalizes importance of monitoring SPO2 with pulse oximeter and return demonstration;Maintenance of O2 saturations>88%;Exhibits proper breathing techniques, such as pursed lip breathing or other method taught during program session;Compliance with respiratory medication  Exhibits compliance with exercise, home and travel O2 prescription;Verbalizes importance of monitoring SPO2 with pulse oximeter and return demonstration;Maintenance of O2 saturations>88%;Exhibits proper breathing techniques, such as pursed lip breathing or other method taught during program session;Compliance with respiratory medication  Exhibits compliance with exercise, home and travel O2 prescription;Verbalizes importance of monitoring SPO2 with pulse oximeter and return demonstration;Maintenance of O2 saturations>88%;Exhibits proper breathing techniques, such as pursed lip breathing or other method taught during program session;Compliance with respiratory medication     Goals/Expected Outcomes  compliance  compliance  compliance        Oxygen Discharge (Final Oxygen Re-Evaluation): Oxygen Re-Evaluation - 10/11/19 0828      Program Oxygen Prescription   Program Oxygen Prescription  Continuous    Liters per minute  8    Comments  8 while seated and 15 on TM      Home Oxygen   Home Oxygen Device  Portable Concentrator;E-Tanks    Sleep Oxygen Prescription  Continuous    Liters per minute  3    Home Exercise Oxygen Prescription  Continuous    Liters per minute  15    Home at Rest Exercise Oxygen Prescription  Continuous    Liters per minute  3    Compliance with Home Oxygen Use  Yes       Goals/Expected Outcomes   Short Term Goals  To learn and exhibit compliance with exercise, home and travel O2 prescription;To learn and understand importance of monitoring SPO2 with pulse oximeter and demonstrate accurate use of the pulse oximeter.;To learn and demonstrate proper pursed lip breathing techniques or other breathing techniques.;To learn and demonstrate proper use of respiratory medications;To learn and understand importance of maintaining oxygen saturations>88%    Long  Term Goals  Exhibits compliance with  exercise, home and travel O2 prescription;Verbalizes importance of monitoring SPO2 with pulse oximeter and return demonstration;Maintenance of O2 saturations>88%;Exhibits proper breathing techniques, such as pursed lip breathing or other method taught during program session;Compliance with respiratory medication    Goals/Expected Outcomes  compliance       Initial Exercise Prescription: Initial Exercise Prescription - 06/13/19 1200      Date of Initial Exercise RX and Referring Provider   Date  06/13/19    Referring Provider  Dr. [name]      Oxygen   Oxygen  Continuous    Liters  4      Treadmill   MPH  1.2    Grade  1    Minutes  15      NuStep   Level  2    SPM  80    Minutes  15      Prescription Details   Frequency (times per week)  2    Duration  Progress to 30 minutes of continuous aerobic without signs/symptoms of physical distress      Intensity   THRR 40-80% of Max Heartrate  63-126    Ratings of Perceived Exertion  11-13    Perceived Dyspnea  0-4      Progression   Progression  Continue progressive overload as per policy without signs/symptoms or physical distress.      Resistance Training   Training Prescription  Yes    Weight  blue bands    Reps  10-15       Perform Capillary Blood Glucose checks as needed.  Exercise Prescription Changes: Exercise Prescription Changes    Row Name 06/21/19 1500 07/05/19 1400 07/19/19 1500 09/13/19 1500  09/27/19 1500     Response to Exercise   Blood Pressure (Admit)  126/64  128/74  120/70  140/80  122/80   Blood Pressure (Exercise)  144/70  140/72  150/70  160/80  138/76   Blood Pressure (Exit)  124/78  128/70  114/80  128/70  110/72   Heart Rate (Admit)  98 bpm  91 bpm  80 bpm  81 bpm  79 bpm   Heart Rate (Exercise)  134 bpm When O2 sat down to 78%  122 bpm  121 bpm  132 bpm  122 bpm   Heart Rate (Exit)  100 bpm  101 bpm  89 bpm  90 bpm  89 bpm   Oxygen Saturation (Admit)  99 %  98 %  100 %  99 %  100 %   Oxygen Saturation (Exercise)  78 % On treadmill, O2 increased to 6 L  89 %  84 % On treadmill sat dropped to 84% increased O2 to 10 L sat 88%  84 %  92 %   Oxygen Saturation (Exit)  100 %  98 %  99 %  90 %  97 %   Rating of Perceived Exertion (Exercise)  _0 Perceived Dyspnea (Exercise)  3  1  2._1 Duration  Progress to 30 minutes of  aerobic without signs/symptoms of physical distress  Continue with 30 min of aerobic exercise without signs/symptoms of physical distress.  Continue with 30 min of aerobic exercise without signs/symptoms of physical distress.  Progress to 30 minutes of  aerobic without signs/symptoms of physical distress  Progress to 30 minutes of  aerobic without signs/symptoms of physical distress   Intensity  Other (comment)  THRR  unchanged  THRR unchanged  THRR unchanged  THRR unchanged     Progression   Progression  -- 40-80% of HRR  Continue to progress workloads to maintain intensity without signs/symptoms of physical distress.  Continue to follow PAD protocol  Continue to progress workloads to maintain intensity without signs/symptoms of physical distress.  Continue to progress workloads to maintain intensity without signs/symptoms of physical distress.     Resistance Training   Training Prescription  Yes  Yes  Yes  Yes  Yes   Weight  blue bands  blue bands  blue bands  blue bands  blue bands   Reps  10-15  10-15  10-15  10-15  10-15   Time   10 Minutes  10 Minutes  10 Minutes  10 Minutes  10 Minutes     Oxygen   Oxygen  Continuous  Continuous  Continuous  Continuous  Continuous   Liters  4-6  4-8  4-10  10-15  10-15     Treadmill   MPH  1.4  1.6  1.6  1.6  1.8   Grade  _0 Minutes  _1 NuStep   Level  _2 SPM  80  80  80  80  80   Minutes  _3 METs  1.5  2.1  2.1  2  2.3   Row Name 10/06/19 1500             Home Exercise Plan   Plans to continue exercise at  Home (comment)       Frequency  Add 3 additional days to program exercise sessions.       Initial Home Exercises Provided  10/06/19          Exercise Comments: Exercise Comments    Row Name 10/06/19 1521           Exercise Comments  home exercise complete          Exercise Goals and Review: Exercise Goals    Row Name 06/13/19 1223 07/04/19 1008 09/06/19 0916 10/11/19 0829       Exercise Goals   Increase Physical Activity  Yes  Yes  Yes  Yes    Intervention  Provide advice, education, support and counseling about physical activity/exercise needs.;Develop an individualized exercise prescription for aerobic and resistive training based on initial evaluation findings, risk stratification, comorbidities and participant's personal goals.  Provide advice, education, support and counseling about physical activity/exercise needs.;Develop an individualized exercise prescription for aerobic and resistive training based on initial evaluation findings, risk stratification, comorbidities and participant's personal goals.  Provide advice, education, support and counseling about physical activity/exercise needs.;Develop an individualized exercise prescription for aerobic and resistive training based on initial evaluation findings, risk stratification, comorbidities and participant's personal goals.  Provide advice, education, support and counseling about physical activity/exercise needs.;Develop an  individualized exercise prescription for aerobic and resistive training based on initial evaluation findings, risk stratification, comorbidities and participant's personal goals.    Expected Outcomes  Short Term: Attend rehab on a regular basis to increase amount of physical activity.;Long Term: Add in home exercise to make exercise part of routine and to increase amount of physical activity.;Long Term: Exercising regularly at least 3-5 days a week.  Short Term: Attend rehab on  a regular basis to increase amount of physical activity.;Long Term: Add in home exercise to make exercise part of routine and to increase amount of physical activity.;Long Term: Exercising regularly at least 3-5 days a week.  Short Term: Attend rehab on a regular basis to increase amount of physical activity.;Long Term: Add in home exercise to make exercise part of routine and to increase amount of physical activity.;Long Term: Exercising regularly at least 3-5 days a week.  Short Term: Attend rehab on a regular basis to increase amount of physical activity.;Long Term: Add in home exercise to make exercise part of routine and to increase amount of physical activity.;Long Term: Exercising regularly at least 3-5 days a week.    Increase Strength and Stamina  Yes  Yes  Yes  Yes    Intervention  Provide advice, education, support and counseling about physical activity/exercise needs.;Develop an individualized exercise prescription for aerobic and resistive training based on initial evaluation findings, risk stratification, comorbidities and participant's personal goals.  Provide advice, education, support and counseling about physical activity/exercise needs.;Develop an individualized exercise prescription for aerobic and resistive training based on initial evaluation findings, risk stratification, comorbidities and participant's personal goals.  Provide advice, education, support and counseling about physical activity/exercise needs.;Develop  an individualized exercise prescription for aerobic and resistive training based on initial evaluation findings, risk stratification, comorbidities and participant's personal goals.  Provide advice, education, support and counseling about physical activity/exercise needs.;Develop an individualized exercise prescription for aerobic and resistive training based on initial evaluation findings, risk stratification, comorbidities and participant's personal goals.    Expected Outcomes  Short Term: Increase workloads from initial exercise prescription for resistance, speed, and METs.;Short Term: Perform resistance training exercises routinely during rehab and add in resistance training at home;Long Term: Improve cardiorespiratory fitness, muscular endurance and strength as measured by increased METs and functional capacity (6MWT)  Short Term: Increase workloads from initial exercise prescription for resistance, speed, and METs.;Short Term: Perform resistance training exercises routinely during rehab and add in resistance training at home;Long Term: Improve cardiorespiratory fitness, muscular endurance and strength as measured by increased METs and functional capacity (6MWT)  Short Term: Increase workloads from initial exercise prescription for resistance, speed, and METs.;Short Term: Perform resistance training exercises routinely during rehab and add in resistance training at home;Long Term: Improve cardiorespiratory fitness, muscular endurance and strength as measured by increased METs and functional capacity (6MWT)  Short Term: Increase workloads from initial exercise prescription for resistance, speed, and METs.;Short Term: Perform resistance training exercises routinely during rehab and add in resistance training at home;Long Term: Improve cardiorespiratory fitness, muscular endurance and strength as measured by increased METs and functional capacity (6MWT)    Able to understand and use rate of perceived exertion  (RPE) scale  Yes  Yes  Yes  Yes    Intervention  Provide education and explanation on how to use RPE scale  Provide education and explanation on how to use RPE scale  Provide education and explanation on how to use RPE scale  Provide education and explanation on how to use RPE scale    Expected Outcomes  Short Term: Able to use RPE daily in rehab to express subjective intensity level;Long Term:  Able to use RPE to guide intensity level when exercising independently  Short Term: Able to use RPE daily in rehab to express subjective intensity level;Long Term:  Able to use RPE to guide intensity level when exercising independently  Short Term: Able to use RPE daily in rehab to  express subjective intensity level;Long Term:  Able to use RPE to guide intensity level when exercising independently  Short Term: Able to use RPE daily in rehab to express subjective intensity level;Long Term:  Able to use RPE to guide intensity level when exercising independently    Able to understand and use Dyspnea scale  Yes  Yes  Yes  Yes    Intervention  Provide education and explanation on how to use Dyspnea scale  Provide education and explanation on how to use Dyspnea scale  Provide education and explanation on how to use Dyspnea scale  Provide education and explanation on how to use Dyspnea scale    Expected Outcomes  Short Term: Able to use Dyspnea scale daily in rehab to express subjective sense of shortness of breath during exertion;Long Term: Able to use Dyspnea scale to guide intensity level when exercising independently  Short Term: Able to use Dyspnea scale daily in rehab to express subjective sense of shortness of breath during exertion;Long Term: Able to use Dyspnea scale to guide intensity level when exercising independently  Short Term: Able to use Dyspnea scale daily in rehab to express subjective sense of shortness of breath during exertion;Long Term: Able to use Dyspnea scale to guide intensity level when exercising  independently  Short Term: Able to use Dyspnea scale daily in rehab to express subjective sense of shortness of breath during exertion;Long Term: Able to use Dyspnea scale to guide intensity level when exercising independently    Knowledge and understanding of Target Heart Rate Range (THRR)  Yes  Yes  Yes  Yes    Intervention  --  Provide education and explanation of THRR including how the numbers were predicted and where they are located for reference  Provide education and explanation of THRR including how the numbers were predicted and where they are located for reference  Provide education and explanation of THRR including how the numbers were predicted and where they are located for reference    Expected Outcomes  Short Term: Able to state/look up THRR;Short Term: Able to use daily as guideline for intensity in rehab;Long Term: Able to use THRR to govern intensity when exercising independently  Short Term: Able to state/look up THRR;Short Term: Able to use daily as guideline for intensity in rehab;Long Term: Able to use THRR to govern intensity when exercising independently  Short Term: Able to state/look up THRR;Short Term: Able to use daily as guideline for intensity in rehab;Long Term: Able to use THRR to govern intensity when exercising independently  Short Term: Able to state/look up THRR;Short Term: Able to use daily as guideline for intensity in rehab;Long Term: Able to use THRR to govern intensity when exercising independently    Understanding of Exercise Prescription  Yes  Yes  Yes  Yes    Intervention  Provide education, explanation, and written materials on patient's individual exercise prescription  Provide education, explanation, and written materials on patient's individual exercise prescription  Provide education, explanation, and written materials on patient's individual exercise prescription  Provide education, explanation, and written materials on patient's individual exercise  prescription    Expected Outcomes  Short Term: Able to explain program exercise prescription;Long Term: Able to explain home exercise prescription to exercise independently  Short Term: Able to explain program exercise prescription;Long Term: Able to explain home exercise prescription to exercise independently  Short Term: Able to explain program exercise prescription;Long Term: Able to explain home exercise prescription to exercise independently  Short Term: Able to explain  program exercise prescription;Long Term: Able to explain home exercise prescription to exercise independently       Exercise Goals Re-Evaluation : Exercise Goals Re-Evaluation    Row Name 07/04/19 1009 09/06/19 0916 10/11/19 0830         Exercise Goal Re-Evaluation   Exercise Goals Review  Increase Physical Activity;Increase Strength and Stamina;Able to understand and use rate of perceived exertion (RPE) scale;Able to understand and use Dyspnea scale;Knowledge and understanding of Target Heart Rate Range (THRR);Understanding of Exercise Prescription  Increase Physical Activity;Increase Strength and Stamina;Able to understand and use rate of perceived exertion (RPE) scale;Able to understand and use Dyspnea scale;Knowledge and understanding of Target Heart Rate Range (THRR);Understanding of Exercise Prescription  Increase Physical Activity;Increase Strength and Stamina;Able to understand and use rate of perceived exertion (RPE) scale;Able to understand and use Dyspnea scale;Knowledge and understanding of Target Heart Rate Range (THRR);Understanding of Exercise Prescription     Comments  Pt has completed 4 exercise sessions. It was determined after his first two visits that he needs 8 L while walking on the treadmill. I will be able to progress pt now that his oxygen needs have been determined. Pt currently exercises at 2.1 METs on the stepper. Will continue to monitor and progress as able.  Pt has been walking on his treadmill since  we have ceased in person exercise. Pt will return 2/23. Will monitor and progress as able.  Pt has completed 17 exercise sessions. Pt is still progressing. He is motivated to lose ~70 lbs so he can be listed on [facility]'s lung transplant list. He is currently exercising at 2.4 METs on the stepper. Will continue to monitor and progress as able.     Expected Outcomes  Through exercise at rehab and at home, the patient will decrease shortness of breath with daily activities and feel confident in carrying out an exercise regime at home.  Through exercise at rehab and at home, the patient will decrease shortness of breath with daily activities and feel confident in carrying out an exercise regime at home.  Through exercise at rehab and at home, the patient will decrease shortness of breath with daily activities and feel confident in carrying out an exercise regime at home.        Discharge Exercise Prescription (Final Exercise Prescription Changes): Exercise Prescription Changes - 10/06/19 1500      Home Exercise Plan   Plans to continue exercise at  Home (comment)    Frequency  Add 3 additional days to program exercise sessions.    Initial Home Exercises Provided  10/06/19       Nutrition:  Target Goals: Understanding of nutrition guidelines, daily intake of sodium <1580m, cholesterol <2079m calories 30% from fat and 7% or less from saturated fats, daily to have 5 or more servings of fruits and vegetables.  Biometrics: Pre Biometrics - 06/13/19 1159      Pre Biometrics   Grip Strength  45 kg        Nutrition Therapy Plan and Nutrition Goals: Nutrition Therapy & Goals - 06/28/19 1517      Personal Nutrition Goals   Nutrition Goal  Pt to identify food quantities necessary to achieve weight loss of 6-24 lb at graduation from pulmonary rehab.    Personal Goal #2  Pt to identify and limit food sources of sodium      Intervention Plan   Intervention  Prescribe, educate and counsel regarding  individualized specific dietary modifications aiming towards targeted core components such  as weight, hypertension, lipid management, diabetes, heart failure and other comorbidities.;Nutrition handout(s) given to patient.    Expected Outcomes  Short Term Goal: Understand basic principles of dietary content, such as calories, fat, sodium, cholesterol and nutrients.;Short Term Goal: A plan has been developed with personal nutrition goals set during dietitian appointment.;Long Term Goal: Adherence to prescribed nutrition plan.       Nutrition Assessments: Nutrition Assessments - 06/28/19 1517      Rate Your Plate Scores   Pre Score  59       Nutrition Goals Re-Evaluation: Nutrition Goals Re-Evaluation    Row Name 06/28/19 1525 10/10/19 1030           Goals   Current Weight  276 lb (125.2 kg)  269 lb (122 kg)      Nutrition Goal  Pt to identify food quantities necessary to achieve weight loss of 6-24 lb at graduation from pulmonary rehab.  Pt to identify food quantities necessary to achieve weight loss of 6-24 lb at graduation from pulmonary rehab.        Personal Goal #2 Re-Evaluation   Personal Goal #2  Pt to identify and limit food sources of sodium  Pt to identify and limit food sources of sodium         Nutrition Goals Discharge (Final Nutrition Goals Re-Evaluation): Nutrition Goals Re-Evaluation - 10/10/19 1030      Goals   Current Weight  269 lb (122 kg)    Nutrition Goal  Pt to identify food quantities necessary to achieve weight loss of 6-24 lb at graduation from pulmonary rehab.      Personal Goal #2 Re-Evaluation   Personal Goal #2  Pt to identify and limit food sources of sodium       Psychosocial: Target Goals: Acknowledge presence or absence of significant depression and/or stress, maximize coping skills, provide positive support system. Participant is able to verbalize types and ability to use techniques and skills needed for reducing stress and  depression.  Initial Review & Psychosocial Screening:   Quality of Life Scores:  Scores of 19 and below usually indicate a poorer quality of life in these areas.  A difference of  2-3 points is a clinically meaningful difference.  A difference of 2-3 points in the total score of the Quality of Life Index has been associated with significant improvement in overall quality of life, self-image, physical symptoms, and general health in studies assessing change in quality of life.  PHQ-9: Recent Review Flowsheet Data    Depression screen [facility] 2/9 06/13/2019 06/13/2019   Decreased Interest 0 0   Down, Depressed, Hopeless 0 0   PHQ - 2 Score 0 0   Altered sleeping 0 -   Tired, decreased energy 0 -   Change in appetite 0 -   Feeling bad or failure about yourself  0 -   Trouble concentrating 0 -   Moving slowly or fidgety/restless 0 -   Suicidal thoughts 0 -   PHQ-9 Score 0 -   Difficult doing work/chores Not difficult at all -     Interpretation of Total Score  Total Score Depression Severity:  1-4 = Minimal depression, 5-9 = Mild depression, 10-14 = Moderate depression, 15-19 = Moderately severe depression, 20-27 = Severe depression   Psychosocial Evaluation and Intervention: Psychosocial Evaluation - 07/04/19 1338      Psychosocial Evaluation & Interventions   Interventions  Encouraged to exercise with the program and follow exercise prescription  Continue Psychosocial Services   No Follow up required       Psychosocial Re-Evaluation: Psychosocial Re-Evaluation    [place] Name 07/04/19 1338 09/05/19 1417 10/10/19 1401         Psychosocial Re-Evaluation   Current issues with  None Identified  None Identified  None Identified     Comments  Abe People has no barriers or psychosocial concerns at this time, he has participated in pulmonary rehab for 2 weeks.  Department closure x 7 weeks, will reopen 09/13/2019, will assess psychosocial barriers or concerns upon return to program  No  barriers or psychosocial concerns identified at this time.  He is being evaluated for a possible lung transplant at [facility].     Expected Outcomes  That Abe People will continue to have no barriers or psychosocial concerns while participating in pulmonary rehab.  Will continue to have no barriers or psychosocial concerns while in pulmonary rehab.  For patient to be free of barriers or psychsocial concerns while in pulmonary rehab.     Interventions  Encouraged to attend Pulmonary Rehabilitation for the exercise  Encouraged to attend Pulmonary Rehabilitation for the exercise  Encouraged to attend Pulmonary Rehabilitation for the exercise     Continue Psychosocial Services   No Follow up required  No Follow up required  No Follow up required        Psychosocial Discharge (Final Psychosocial Re-Evaluation): Psychosocial Re-Evaluation - 10/10/19 1401      Psychosocial Re-Evaluation   Current issues with  None Identified    Comments  No barriers or psychosocial concerns identified at this time.  He is being evaluated for a possible lung transplant at [facility].    Expected Outcomes  For patient to be free of barriers or psychsocial concerns while in pulmonary rehab.    Interventions  Encouraged to attend Pulmonary Rehabilitation for the exercise    Continue Psychosocial Services   No Follow up required       Education: Education Goals: Education classes will be provided on a weekly basis, covering required topics. Participant will state understanding/return demonstration of topics presented.  Learning Barriers/Preferences:   Education Topics: Risk Factor Reduction:  -Group instruction that is supported by a PowerPoint presentation. Instructor discusses the definition of a risk factor, different risk factors for pulmonary disease, and how the heart and lungs work together.     Nutrition for Pulmonary Patient:  -Group instruction provided by PowerPoint slides, verbal  discussion, and written materials to support subject matter. The instructor gives an explanation and review of healthy diet recommendations, which includes a discussion on weight management, recommendations for fruit and vegetable consumption, as well as protein, fluid, caffeine, fiber, sodium, sugar, and alcohol. Tips for eating when patients are short of breath are discussed.   Pursed Lip Breathing:  -Group instruction that is supported by demonstration and informational handouts. Instructor discusses the benefits of pursed lip and diaphragmatic breathing and detailed demonstration on how to preform both.     Oxygen Safety:  -Group instruction provided by PowerPoint, verbal discussion, and written material to support subject matter. There is an overview of "What is Oxygen" and "Why do we need it".  Instructor also reviews how to create a safe environment for oxygen use, the importance of using oxygen as prescribed, and the risks of noncompliance. There is a brief discussion on traveling with oxygen and resources the patient may utilize.   Oxygen Equipment:  -Group instruction provided by [facility] Staff utilizing handouts,  written materials, and equipment demonstrations.   Signs and Symptoms:  -Group instruction provided by written material and verbal discussion to support subject matter. Warning signs and symptoms of infection, stroke, and heart attack are reviewed and when to call the physician/911 reinforced. Tips for preventing the spread of infection discussed.   Advanced Directives:  -Group instruction provided by verbal instruction and written material to support subject matter. Instructor reviews Advanced Directive laws and proper instruction for filling out document.   Pulmonary Video:  -Group video education that reviews the importance of medication and oxygen compliance, exercise, good nutrition, pulmonary hygiene, and pursed lip and diaphragmatic breathing for the pulmonary  patient.   Exercise for the Pulmonary Patient:  -Group instruction that is supported by a PowerPoint presentation. Instructor discusses benefits of exercise, core components of exercise, frequency, duration, and intensity of an exercise routine, importance of utilizing pulse oximetry during exercise, safety while exercising, and options of places to exercise outside of rehab.     Pulmonary Medications:  -Verbally interactive group education provided by instructor with focus on inhaled medications and proper administration.   Anatomy and Physiology of the Respiratory System and Intimacy:  -Group instruction provided by PowerPoint, verbal discussion, and written material to support subject matter. Instructor reviews respiratory cycle and anatomical components of the respiratory system and their functions. Instructor also reviews differences in obstructive and restrictive respiratory diseases with examples of each. Intimacy, Sex, and Sexuality differences are reviewed with a discussion on how relationships can change when diagnosed with pulmonary disease. Common sexual concerns are reviewed.   PULMONARY REHAB OTHER RESPIRATORY from 09/29/2019 in [place]  Date  09/29/19  Educator  DF  Instruction Review Code  2- Demonstrated Understanding      MD DAY -A group question and answer session with a medical doctor that allows participants to ask questions that relate to their pulmonary disease state.   OTHER EDUCATION -Group or individual verbal, written, or video instructions that support the educational goals of the pulmonary rehab program.   PULMONARY REHAB OTHER RESPIRATORY from 09/22/2019 in [place]  Date  09/22/19  Educator  DF Gwyndolyn Kaufman your numbers]  Instruction Review Code  2- Demonstrated Understanding      Holiday Eating Survival Tips:  -Group instruction provided by PowerPoint slides, verbal discussion, and written  materials to support subject matter. The instructor gives patients tips, tricks, and techniques to help them not only survive but enjoy the holidays despite the onslaught of food that accompanies the holidays.   Knowledge Questionnaire Score: Knowledge Questionnaire Score - 06/13/19 1137      Knowledge Questionnaire Score   Pre Score  16/18       Core Components/Risk Factors/Patient Goals at Admission: Personal Goals and Risk Factors at Admission - 07/04/19 1340      Core Components/Risk Factors/Patient Goals on Admission   Improve shortness of breath with ADL's  Yes    Intervention  Provide education, individualized exercise plan and daily activity instruction to help decrease symptoms of SOB with activities of daily living.    Expected Outcomes  Short Term: Improve cardiorespiratory fitness to achieve a reduction of symptoms when performing ADLs;Long Term: Be able to perform more ADLs without symptoms or delay the onset of symptoms       Core Components/Risk Factors/Patient Goals Review:  Goals and Risk Factor Review    Row Name 07/04/19 1340 09/05/19 1420 10/10/19 1403  Core Components/Risk Factors/Patient Goals Review   Personal Goals Review  Develop more efficient breathing techniques such as purse lipped breathing and diaphragmatic breathing and practicing self-pacing with activity.;Increase knowledge of respiratory medications and ability to use respiratory devices properly.;Improve shortness of breath with ADL's  Weight Management/Obesity;Develop more efficient breathing techniques such as purse lipped breathing and diaphragmatic breathing and practicing self-pacing with activity.;Increase knowledge of respiratory medications and ability to use respiratory devices properly.;Improve shortness of breath with ADL's  Weight Management/Obesity;Develop more efficient breathing techniques such as purse lipped breathing and diaphragmatic breathing and practicing self-pacing with  activity.;Increase knowledge of respiratory medications and ability to use respiratory devices properly.;Improve shortness of breath with ADL's     Review  Abe People just started the program, has attended 4 exercise sessions, too early to have met any program goals.  Department closure x 7 weeks, will reopen 2/23 and continue working on his admission goals.  He has lost 6 kg in the last 3 months, [facility] is requiring this in anticipation of a lung transplant.  He is exercising at level 4 on the nustep on 10 L/min of supplemental oxygen, and walking @ 1.9 mph and 2% incline on the treadmill requiring 15 L/min of oxygen     Expected Outcomes  See admission goals.  See admission goals.  See admission goals.        Core Components/Risk Factors/Patient Goals at Discharge (Final Review):  Goals and Risk Factor Review - 10/10/19 1403      Core Components/Risk Factors/Patient Goals Review   Personal Goals Review  Weight Management/Obesity;Develop more efficient breathing techniques such as purse lipped breathing and diaphragmatic breathing and practicing self-pacing with activity.;Increase knowledge of respiratory medications and ability to use respiratory devices properly.;Improve shortness of breath with ADL's    Review  He has lost 6 kg in the last 3 months, [facility] is requiring this in anticipation of a lung transplant.  He is exercising at level 4 on the nustep on 10 L/min of supplemental oxygen, and walking @ 1.9 mph and 2% incline on the treadmill requiring 15 L/min of oxygen    Expected Outcomes  See admission goals.       ITP Comments:   Comments: ITP REVIEW Pt is making expected progress toward pulmonary rehab goals after completing 17 sessions. Recommend continued exercise, life style modification, education, and utilization of breathing techniques to increase stamina and strength and decrease shortness of breath with exertion.

## 2019-10-13 ENCOUNTER — Encounter (HOSPITAL_COMMUNITY)
Admission: RE | Admit: 2019-10-13 | Discharge: 2019-10-13 | Disposition: A | Discharge status: Home / Self Care | Payer: Managed Care, Other (non HMO) | Source: Ambulatory Visit | Attending: Pulmonary Disease | Admitting: Pulmonary Disease

## 2019-10-13 ENCOUNTER — Other Ambulatory Visit: Payer: Self-pay

## 2019-10-13 DIAGNOSIS — J849 Interstitial pulmonary disease, unspecified: Secondary | ICD-10-CM | POA: Diagnosis not present

## 2019-10-13 NOTE — Progress Notes (Signed)
Daily Session Note  Patient Details  Name: Brent Holloway  MRN: [id number] Date of Birth: [birth year] Referring Provider:     Pulmonary Rehab Walk Test from 06/13/2019 in [place]  Referring Provider  Dr. [name]      Encounter Date: 10/13/2019  Check In: Session Check In - 10/13/19 1452      Check-In   Supervising physician immediately available to respond to emergencies  [facility] immediately available    Physician(s)  Dr. Marylyn Ishihara    Location  [facility]    Staff Present  Rosebud Poles, RN, Bjorn Loser, MS, Exercise Physiologist    Virtual Visit  No    Medication changes reported      No    Fall or balance concerns reported     No    Tobacco Cessation  No Change    Warm-up and Cool-down  Performed on first and last piece of equipment    Resistance Training Performed  Yes    VAD Patient?  No    PAD/SET Patient?  No      Pain Assessment   Currently in Pain?  No/denies    Multiple Pain Sites  No       Capillary Blood Glucose: No results found for this or any previous visit (from the past 24 hour(s)).    Social History   Tobacco Use  Smoking Status Former Smoker  . Packs/day: 0.25  . Years: 44.00  . Pack years: 11.00  . Types: Cigars  . Start date: 68  . Quit date: 03/29/2017  . Years since quitting: 2.5  Smokeless Tobacco Never Used  Tobacco Comment   5-6 cigars daily     Goals Met:  Independence with exercise equipment Exercise tolerated well Strength training completed today  Goals Unmet:  Not Applicable  Comments: Service time is from 1340 to 1440    Dr. Fransico Him is Medical Director for Cardiac Rehab at [facility].

## 2019-10-17 ENCOUNTER — Encounter: Payer: Self-pay | Admitting: Pulmonary Disease

## 2019-10-17 ENCOUNTER — Ambulatory Visit: Payer: Managed Care, Other (non HMO) | Admitting: Pulmonary Disease

## 2019-10-17 ENCOUNTER — Other Ambulatory Visit: Payer: Self-pay

## 2019-10-17 VITALS — BP 120/80 | HR 70 | Temp 97.4°F | Ht 71.0 in | Wt 265.2 lb

## 2019-10-17 DIAGNOSIS — J84112 Idiopathic pulmonary fibrosis: Secondary | ICD-10-CM | POA: Diagnosis not present

## 2019-10-17 DIAGNOSIS — G4733 Obstructive sleep apnea (adult) (pediatric): Secondary | ICD-10-CM | POA: Diagnosis not present

## 2019-10-17 DIAGNOSIS — Z5181 Encounter for therapeutic drug level monitoring: Secondary | ICD-10-CM

## 2019-10-17 MED ORDER — OMEPRAZOLE 20 MG PO CPDR: 20.0000 mg | DELAYED_RELEASE_CAPSULE | Freq: Every day | ORAL | 1 refills | Status: DC

## 2019-10-17 NOTE — Patient Instructions (Signed)
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 Will need CBC/CMP every month for the 1st 6 months, followed by q3 months. --Continue omeprazole 20 mg daily --Continue Breo 200-25 mcg ONE puff daily  Chronic hypoxemic respiratory failure secondary to interstitial lung disease and untreated OSA --Continue supplemental oxygen  OSA on CPAP The natural history, progression and prognosis of sleep apnea, treatment with PAP and alternative treatment strategies were discussed. The patient was also educated regarding the long term cardiovascular benefits of treating sleep apnea, including improved blood pressure control, reduction in MI and stroke risk as well as other potential benefits of treatment, such as improved glycemic control, facilitation of weight loss, improved energy during the day and improved sleep quality. --Counseled on sleep hygiene --Counseled on weight loss/maintenance of healthy weight --Counseled NOT to drive if/when sleepy --Advised patient to wear CPAP for at least 4 hours each night for greater than 70% of the time to avoid the machine being repossessed by insurance.  Follow-up in 3 months with NP Cherre Huger

## 2019-10-17 NOTE — Progress Notes (Signed)
Subjective:   PATIENT ID: Brent Holloway GENDER: male DOB: [birth year], MRN: [id number]   HPI  Chief Complaint  Patient presents with  . Follow-up    CPAP.  wears a place on side of his nose, sore.  breathing about the same.  coughs a lot especially in the am.  Sob with exertion. talking causes coughing.   Reason for Visit: Follow-up for IPF  Brent Holloway is a 63 year old former smoker with heterozygous factor V Leyden on Xarelto who presents for follow-up for IPF  He was been evaluated by [facility] in 2/19 and 2/24. Notes reviewed and summarized as follows: He has been advised to lose weight for a target BMI 29 (215lbs).  [facility] plans for PFTs, labs, CXR in June 2021.   He has enrolled with Weight [employer] online and uses an app to track his calories. He is UTD on pneumococcal and the first of the COVID series. He is compliant with oxygen with exertion. He is still participating with Pulmonary Rehab and works on the treadmill and stepper routinely. He is also compliant with his Ofev and bronchodilators, denying and adverse reactions. He was previously prescribed steroids but did not notice if it helped with his symptoms.  Overall, he does not feel like his symptoms have worsened. He has dyspnea on exertion. He wears his CPAP when he can. It is difficult to tolerate due to facial irritation. He is able to wear it most nights but reports keeping it on for four hours is hard.  Social History: Previously smoked 4-5 cigars a day. Quit in 2018.  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  Past Medical History:  Diagnosis Date  . DVT (deep venous thrombosis) ([place])   . Factor 5 Leiden mutation,  heterozygous ([place])      Family History  Problem Relation Age of Onset  . Heart disease Father   . Congestive Heart Failure Father   . Dementia Mother      Social History   Occupational History  . Not on file  Tobacco Use  . Smoking status: Former Smoker    Packs/day: 0.25    Years: 44.00    Pack years: 11.00    Types: Cigars    Start date: 96    Quit date: 03/29/2017    Years since quitting: 2.5  . Smokeless tobacco: Never Used  . Tobacco comment: 5-6 cigars daily   Substance and Sexual Activity  . Alcohol use: Yes    Comment: occ  . Drug use: No  . Sexual activity: Not on file    No Known Allergies   Outpatient Medications Prior to Visit  Medication Sig Dispense Refill  . fluticasone furoate-vilanterol (BREO ELLIPTA) 200-25 MCG/INH AEPB Inhale 1 puff into the lungs daily. 60 each 2  . Multiple Vitamins-Minerals (MULTIVITAMIN WITH MINERALS) tablet Take 1 tablet by mouth daily.    . Nintedanib (OFEV) 150 MG CAPS Take 1 capsule (150 mg total) by mouth 2 (two) times daily. 60 capsule 2  . Omega-3 Fatty Acids (FISH OIL) 1000 MG CAPS Take 1 capsule by mouth daily.    Marland Kitchen omeprazole (PRILOSEC) 20 MG capsule Take 1 capsule (20 mg total) by mouth daily. 30 capsule 6  . XARELTO 20 MG TABS tablet Take 20  mg by mouth daily.  2  . cetirizine (ZYRTEC) 10 MG tablet Take 10 mg by mouth daily.    . predniSONE (DELTASONE) 10 MG tablet Take 6 tablets x three days (60 mg), followed by 5 tablets x three days (37m), then 4 tablets x three day(411m, then 3 tablets (309mx three days, then 2 tablets (74m37m three days, then 1 tablet (10mg42mthree days, then STOP (Patient not taking: Reported on 10/17/2019) 63 tablet 0   No facility-administered medications prior to visit.    Review of Systems  Constitutional: Negative for chills, diaphoresis, fever, malaise/fatigue and weight loss.  HENT: Negative for congestion.   Respiratory: Positive for shortness of breath. Negative for cough,  hemoptysis, sputum production and wheezing.   Cardiovascular: Negative for chest pain, palpitations and leg swelling.    Objective:   Vitals:   10/17/19 0950  BP: 120/80  Pulse: 70  Temp: (!) 97.4 F (36.3 C)  TempSrc: Temporal  SpO2: 99%  Weight: 265 lb 3.2 oz (120.3 kg)  Height: 5' 11"  (1.803 m)   SpO2: 99 % O2 Device: Nasal cannula O2 Type: Pulse O2 FiO2 (%): (!) 6 %  Physical Exam: General: Well-appearing, no acute distress HENT: [place], AT Eyes: EOMI, no scleral icterus Respiratory: Bibasilar crackles, no wheezing Cardiovascular: RRR, -M/R/G, no JVD GI: BS+, soft, nontender Extremities:-Edema,-tenderness Neuro: AAO x4, CNII-XII grossly intact Skin: Intact, no rashes or bruising Psych: Normal mood, normal affect  Data Reviewed:  Imaging: CT chest 07/08/2017 - Upper lobe predominant L>R subpleural reticulation with cystic-appearing lesions that may represent early honeycombing CT Chest 05/09/19 - Progressive subpleural reticulation associated with GGO with diffuse involvement predominantly worse in the basilar regions with honeycombing and traction bronchiectasis. RUL nodule also noted to be increased 6mm>>68m compared to 07/09/19 imaging. PET/CT 08/09/19 - Progressive subpleural reticulation with GGO with diffuse involvement with honeycombing and traction bronchiectasis. Unchange 8-9mm ri80m upper lobe lung nodule with low-level hypermetabolic activity. Activity level is similar to what is found diffusely throughout lung parenchyma  PFT: 04/15/19 FVC 3.29 (67%) FEV1 2.93 (80%) Ratio 87  TLC 61% DLCO 50% Interpretation: Mild restrictive defect with moderately reduced DLCO  Labs: ANA - neg HIV ab - nonreactive Anti-scleroderma ab - <0.2 Anti-ds DNA ab - 1 Aldolase - 7 CK - 129 ANCA screen - neg Anti-ribonucleic acid ab - 11.2 (neg) RF 13.1 SSA ab 0.5 SSB ab 0.2 CCP ab 6  Ambulatory O2 07/05/19 Patient Saturations on Room Air at Rest = 98% Patient Saturations  on Room Air while Ambulating = 86% Patient Saturations on 3 Liters of oxygen while Ambulating = 92% CONCLUSION: Recommend 3L O2 with exertion and sleep  Ambulatory O2 08/11/19 Patient Saturations on Room Air at Rest = 91% Patient Saturations on Room Air while Ambulating = 83% Patient Saturations on 10 Liters of oxygen while Ambulating = 93%  Home sleep study 06/13/19 Severe sleep apnea AHI 34.1. Recommend CPAP sleep titration study  6MWT 06/13/19 - 659 feet. Required 4L  CPAP Compliance Report 09/17/19-10/16/19 Usage days 19/30 days 63% >4hours 43% AHI on CPAP 0.0  Imaging, labs and test noted above have been reviewed independently by me.  Assessment & Plan:   Discussion: 64 year64ld male with hx RLE DVT secondary to heterozygous Factor V Leiden who presents for progressive idiopathic pulmonary fibrosis. PFT compared to 03/2019 to 12/20 demonstrated ~8% decrease in FEV1 and ~14% decrease in DLCO. Autoimmune work-up negative. 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 He is compliant with oxygen, bronchodilators, Ofev and Pulmonary rehab.   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 Will need CBC/CMP every month for the 1st 6 months, followed by q3 months. --Continue omeprazole 20 mg daily --Continue Breo 200-25 mcg ONE puff daily  Chronic hypoxemic respiratory failure secondary to interstitial lung disease and OSA --Continue supplemental oxygen  OSA on CPAP The natural history, progression and prognosis of sleep apnea, treatment with PAP and alternative treatment strategies were discussed. The patient was also educated regarding the long term  cardiovascular benefits of treating sleep apnea, including improved blood pressure control, reduction in MI and stroke risk as well as other potential benefits of treatment, such as improved glycemic control, facilitation of weight loss, improved energy during the day and improved sleep quality. --Counseled on sleep hygiene --Counseled on weight loss/maintenance of healthy weight --Counseled NOT to drive if/when sleepy --Advised patient to wear CPAP for at least 4 hours each night for greater than 70% of the time to avoid the machine being repossessed by insurance.  Right upper lobe pulmonary nodule Mediastinal and hilar lymphadenopathy --Stable nodule and mediastinal adenopathy. Nodule SUV 2.1 similar to parenchyma. Would be high risk for bronchoscopy with biopsy with current O2 requirements in lesion with low likelihood to be cancer. With his above issues that pose a more imminent threat to his life, would not recommend aggressive management at this point. --Hold off on CT Chest without contrast for now  Health Maintenance Immunization History  Administered Date(s) Administered  . Influenza Inj Mdck Quad Pf 03/23/2019  . PFIZER SARS-COV-2 Vaccination 10/12/2019  . Pneumococcal Polysaccharide-23 09/23/2019   Orders Placed This Encounter  Procedures  . CBC w/Diff    Standing Status:   Standing    Number of Occurrences:   6    Standing Expiration Date:   10/16/2020  . Comp Met (CMET)    Standing Status:   Standing    Number of Occurrences:   6    Standing Expiration Date:   10/16/2020   Meds ordered this encounter  Medications  . omeprazole (PRILOSEC) 20 MG capsule    Sig: Take 1 capsule (20 mg total) by mouth daily.    Dispense:  90 capsule    Refill:  1    Return in about 3 months (around 01/17/2020) for with Wyn Quaker.  Pritchett, MD [facility] 10/17/2019 8:05 PM  Office Number [phone]

## 2019-10-18 ENCOUNTER — Other Ambulatory Visit: Payer: Self-pay

## 2019-10-18 ENCOUNTER — Encounter (HOSPITAL_COMMUNITY)
Admission: RE | Admit: 2019-10-18 | Discharge: 2019-10-18 | Disposition: A | Discharge status: Home / Self Care | Payer: Managed Care, Other (non HMO) | Source: Ambulatory Visit | Attending: Pulmonary Disease | Admitting: Pulmonary Disease

## 2019-10-18 DIAGNOSIS — J849 Interstitial pulmonary disease, unspecified: Secondary | ICD-10-CM | POA: Diagnosis not present

## 2019-10-18 NOTE — Progress Notes (Signed)
Daily Session Note  Patient Details  Name: Brent Holloway  MRN: [id number] Date of Birth: 1955-07-25 Referring Provider:     Pulmonary Rehab Walk Test from 06/13/2019 in [place]  Referring Provider  Dr. [name]      Encounter Date: 10/18/2019  Check In: Session Check In - 10/18/19 1346      Check-In   Supervising physician immediately available to respond to emergencies  [facility] immediately available    Physician(s)  Dr. Sharlet Salina    Location  [facility]    Staff Present  Rosebud Poles, RN, Bjorn Loser, MS, Exercise Physiologist;Lisa Ysidro Evert, RN    Virtual Visit  No    Medication changes reported      No    Fall or balance concerns reported     No    Tobacco Cessation  No Change    Warm-up and Cool-down  Performed on first and last piece of equipment    Resistance Training Performed  Yes    VAD Patient?  No    PAD/SET Patient?  No      Pain Assessment   Currently in Pain?  No/denies    Multiple Pain Sites  No       Capillary Blood Glucose: No results found for this or any previous visit (from the past 24 hour(s)).    Social History   Tobacco Use  Smoking Status Former Smoker  . Packs/day: 0.25  . Years: 44.00  . Pack years: 11.00  . Types: Cigars  . Start date: 26  . Quit date: 03/29/2017  . Years since quitting: 2.5  Smokeless Tobacco Never Used  Tobacco Comment   5-6 cigars daily     Goals Met:  Independence with exercise equipment Exercise tolerated well Strength training completed today  Goals Unmet:  Not Applicable  Comments: Service time is from 1345 to 1440    Dr. Fransico Him is Medical Director for Cardiac Rehab at [facility].

## 2019-10-20 ENCOUNTER — Other Ambulatory Visit: Payer: Self-pay

## 2019-10-20 ENCOUNTER — Encounter (HOSPITAL_COMMUNITY)
Admission: RE | Admit: 2019-10-20 | Discharge: 2019-10-20 | Disposition: A | Discharge status: Home / Self Care | Payer: Managed Care, Other (non HMO) | Source: Ambulatory Visit | Attending: Pulmonary Disease | Admitting: Pulmonary Disease

## 2019-10-20 DIAGNOSIS — Z20828 Contact with and (suspected) exposure to other viral communicable diseases: Secondary | ICD-10-CM | POA: Diagnosis not present

## 2019-10-20 DIAGNOSIS — J849 Interstitial pulmonary disease, unspecified: Secondary | ICD-10-CM

## 2019-10-20 DIAGNOSIS — Z87891 Personal history of nicotine dependence: Secondary | ICD-10-CM | POA: Diagnosis not present

## 2019-10-20 NOTE — Progress Notes (Signed)
Daily Session Note  Patient Details  Name: Brent Holloway MRN: [id number] Date of Birth: [birth year] Referring Provider:     Pulmonary Rehab Walk Test from 06/13/2019 in [place]  Referring Provider  Dr. [name]      Encounter Date: 10/20/2019  Check In: Session Check In - 10/20/19 1340      Check-In   Supervising physician immediately available to respond to emergencies  [facility] immediately available    Physician(s)  Dr. Loleta Books    Location  [facility]    Staff Present  Rosebud Poles, RN, Bjorn Loser, MS, Exercise Physiologist;Lisa Ysidro Evert, RN    Virtual Visit  No    Medication changes reported      No    Fall or balance concerns reported     No    Tobacco Cessation  No Change    Warm-up and Cool-down  Performed on first and last piece of equipment    Resistance Training Performed  Yes    VAD Patient?  No    PAD/SET Patient?  No      Pain Assessment   Currently in Pain?  No/denies    Multiple Pain Sites  No       Capillary Blood Glucose: No results found for this or any previous visit (from the past 24 hour(s)).    Social History   Tobacco Use  Smoking Status Former Smoker  . Packs/day: 0.25  . Years: 44.00  . Pack years: 11.00  . Types: Cigars  . Start date: 67  . Quit date: 03/29/2017  . Years since quitting: 2.5  Smokeless Tobacco Never Used  Tobacco Comment   5-6 cigars daily     Goals Met:  Independence with exercise equipment Exercise tolerated well Strength training completed today  Goals Unmet:  Not Applicable  Comments: Service time is from 1348 to 72    Dr. Fransico Him is Medical Director for Cardiac Rehab at [facility].

## 2019-10-22 ENCOUNTER — Other Ambulatory Visit: Payer: Self-pay | Admitting: Pulmonary Disease

## 2019-10-22 DIAGNOSIS — J84112 Idiopathic pulmonary fibrosis: Secondary | ICD-10-CM

## 2019-10-25 ENCOUNTER — Encounter (HOSPITAL_COMMUNITY)
Admission: RE | Admit: 2019-10-25 | Discharge: 2019-10-25 | Disposition: A | Discharge status: Home / Self Care | Payer: Managed Care, Other (non HMO) | Source: Ambulatory Visit | Attending: Pulmonary Disease | Admitting: Pulmonary Disease

## 2019-10-25 ENCOUNTER — Other Ambulatory Visit: Payer: Self-pay

## 2019-10-25 VITALS — Wt 264.8 lb

## 2019-10-25 DIAGNOSIS — J849 Interstitial pulmonary disease, unspecified: Secondary | ICD-10-CM

## 2019-10-25 NOTE — Progress Notes (Signed)
Daily Session Note  Patient Details  Name: Brent Holloway MRN: [id number] Date of Birth: 20-May-1956 Referring Provider:     Pulmonary Rehab Walk Test from 06/13/2019 in [place]  Referring Provider  Dr. [name]      Encounter Date: 10/25/2019  Check In: Session Check In - 10/25/19 1458      Check-In   Supervising physician immediately available to respond to emergencies  [facility] immediately available    Physician(s)  Dr. Broadus John    Location  [facility]    Staff Present  Rosebud Poles, RN, Bjorn Loser, MS, Exercise Physiologist;Lisa Ysidro Evert, RN    Virtual Visit  No    Medication changes reported      No    Fall or balance concerns reported     No    Tobacco Cessation  No Change    Warm-up and Cool-down  Performed on first and last piece of equipment    Resistance Training Performed  Yes    VAD Patient?  No    PAD/SET Patient?  No      Pain Assessment   Currently in Pain?  No/denies    Multiple Pain Sites  No       Capillary Blood Glucose: No results found for this or any previous visit (from the past 24 hour(s)).  Exercise Prescription Changes - 10/25/19 1400      Response to Exercise   Blood Pressure (Admit)  114/64    Blood Pressure (Exercise)  150/70    Blood Pressure (Exit)  106/64    Heart Rate (Admit)  78 bpm    Heart Rate (Exercise)  107 bpm    Heart Rate (Exit)  88 bpm    Oxygen Saturation (Admit)  99 %    Oxygen Saturation (Exercise)  95 %    Oxygen Saturation (Exit)  98 %    Rating of Perceived Exertion (Exercise)  11    Perceived Dyspnea (Exercise)  2    Duration  Continue with 30 min of aerobic exercise without signs/symptoms of physical distress.    Intensity  THRR unchanged      Progression   Progression  Continue to progress workloads to maintain intensity without signs/symptoms of physical distress.      Resistance Training   Training Prescription  Yes    Weight  blue bands    Reps  10-15    Time  10 Minutes      Oxygen   Oxygen  Continuous    Liters  10-15      Treadmill   MPH  1.9    Grade  2    Minutes  15      NuStep   Level  5    SPM  80    Minutes  15    METs  2.7       Social History   Tobacco Use  Smoking Status Former Smoker  . Packs/day: 0.25  . Years: 44.00  . Pack years: 11.00  . Types: Cigars  . Start date: 32  . Quit date: 03/29/2017  . Years since quitting: 2.5  Smokeless Tobacco Never Used  Tobacco Comment   5-6 cigars daily     Goals Met:  Independence with exercise equipment Exercise tolerated well Strength training completed today  Goals Unmet:  Not Applicable  Comments: Service time is from 1340 to 1443    Dr. Fransico Him is Medical Director for Cardiac Rehab at  [facility].

## 2019-10-27 ENCOUNTER — Encounter (HOSPITAL_COMMUNITY)
Admission: RE | Admit: 2019-10-27 | Discharge: 2019-10-27 | Disposition: A | Discharge status: Home / Self Care | Payer: Managed Care, Other (non HMO) | Source: Ambulatory Visit | Attending: Pulmonary Disease | Admitting: Pulmonary Disease

## 2019-10-27 ENCOUNTER — Other Ambulatory Visit: Payer: Self-pay

## 2019-10-27 DIAGNOSIS — J849 Interstitial pulmonary disease, unspecified: Secondary | ICD-10-CM

## 2019-10-27 NOTE — Progress Notes (Signed)
Daily Session Note  Patient Details  Name: Arshan Jabs MRN: [id number] Date of Birth: 07-05-1956 Referring Provider:     Pulmonary Rehab Walk Test from 06/13/2019 in [place]  Referring Provider  Dr. [name]      Encounter Date: 10/27/2019  Check In: Session Check In - 10/27/19 1408      Check-In   Supervising physician immediately available to respond to emergencies  [facility] immediately available    Physician(s)  Dr. Broadus John    Location  [facility]    Staff Present  Rosebud Poles, RN, Bjorn Loser, MS, Exercise Physiologist;Markanthony Gedney Ysidro Evert, RN    Virtual Visit  No    Medication changes reported      No    Fall or balance concerns reported     No    Tobacco Cessation  No Change    Warm-up and Cool-down  Performed on first and last piece of equipment    Resistance Training Performed  Yes    VAD Patient?  No    PAD/SET Patient?  No      Pain Assessment   Currently in Pain?  No/denies    Multiple Pain Sites  No       Capillary Blood Glucose: No results found for this or any previous visit (from the past 24 hour(s)).    Social History   Tobacco Use  Smoking Status Former Smoker  . Packs/day: 0.25  . Years: 44.00  . Pack years: 11.00  . Types: Cigars  . Start date: 32  . Quit date: 03/29/2017  . Years since quitting: 2.5  Smokeless Tobacco Never Used  Tobacco Comment   5-6 cigars daily     Goals Met:  Exercise tolerated well No report of cardiac concerns or symptoms Strength training completed today  Goals Unmet:  Not Applicable  Comments: Service time is from 1330 to 1435    Dr. Fransico Him is Medical Director for Cardiac Rehab at [facility].

## 2019-11-01 ENCOUNTER — Other Ambulatory Visit: Payer: Self-pay

## 2019-11-01 ENCOUNTER — Encounter (HOSPITAL_COMMUNITY)
Admission: RE | Admit: 2019-11-01 | Discharge: 2019-11-01 | Disposition: A | Discharge status: Home / Self Care | Payer: Managed Care, Other (non HMO) | Source: Ambulatory Visit | Attending: Pulmonary Disease | Admitting: Pulmonary Disease

## 2019-11-01 DIAGNOSIS — J849 Interstitial pulmonary disease, unspecified: Secondary | ICD-10-CM | POA: Diagnosis not present

## 2019-11-01 NOTE — Progress Notes (Signed)
Daily Session Note  Patient Details  Name: Brent Holloway MRN: [id number] Date of Birth: 29-Sep-1955 Referring Provider:     Pulmonary Rehab Walk Test from 06/13/2019 in [place]  Referring Provider  Dr. [name]      Encounter Date: 11/01/2019  Check In: Session Check In - 11/01/19 1503      Check-In   Supervising physician immediately available to respond to emergencies  [facility] immediately available   Simultaneous filing. User may not have seen previous data.   Physician(s)  Dr. Doristine Bosworth   Simultaneous filing. User may not have seen previous data.   Location  [facility]   Simultaneous filing. User may not have seen previous data.   Staff Present  Rosebud Poles, RN, Bjorn Loser, MS, Exercise Physiologist;Laynie Espy Ysidro Evert, RN   Simultaneous filing. User may not have seen previous data.   Virtual Visit  No   Simultaneous filing. User may not have seen previous data.   Medication changes reported      No   Simultaneous filing. User may not have seen previous data.   Fall or balance concerns reported     No   Simultaneous filing. User may not have seen previous data.   Tobacco Cessation  No Change   Simultaneous filing. User may not have seen previous data.   Warm-up and Cool-down  Performed on first and last piece of equipment   Simultaneous filing. User may not have seen previous data.   Resistance Training Performed  Yes   Simultaneous filing. User may not have seen previous data.   VAD Patient?  No   Simultaneous filing. User may not have seen previous data.   PAD/SET Patient?  No   Simultaneous filing. User may not have seen previous data.     Pain Assessment   Currently in Pain?  No/denies   Simultaneous filing. User may not have seen previous data.   Multiple Pain Sites  No   Simultaneous filing. User may not have seen previous data.      Capillary Blood Glucose: No results found for this or any previous  visit (from the past 24 hour(s)).    Social History   Tobacco Use  Smoking Status Former Smoker  . Packs/day: 0.25  . Years: 44.00  . Pack years: 11.00  . Types: Cigars  . Start date: 25  . Quit date: 03/29/2017  . Years since quitting: 2.5  Smokeless Tobacco Never Used  Tobacco Comment   5-6 cigars daily     Goals Met:  Exercise tolerated well No report of cardiac concerns or symptoms Strength training completed today  Goals Unmet:  Not Applicable  Comments: Service time is from 1335 to 1445    Dr. Fransico Him is Medical Director for Cardiac Rehab at [facility].

## 2019-11-17 NOTE — Progress Notes (Signed)
Discharge Progress Report  Patient Details  Name: Brent Holloway MRN: [id number] Date of Birth: Mar 11, 1956 Referring Provider:     Pulmonary Rehab Walk Test from 06/13/2019 in [place]  Referring Provider  Dr. [name]       Number of Visits: 24  Reason for Discharge:  Patient reached a stable level of exercise. Patient independent in their exercise. Patient has met program and personal goals.  Smoking History:  Social History   Tobacco Use  Smoking Status Former Smoker  . Packs/day: 0.25  . Years: 44.00  . Pack years: 11.00  . Types: Cigars  . Start date: 81  . Quit date: 03/29/2017  . Years since quitting: 2.6  Smokeless Tobacco Never Used  Tobacco Comment   5-6 cigars daily     Diagnosis:  Interstitial lung disease ([place])  ADL [facility]: Pulmonary Assessment Scores    Row Name 06/13/19 1123 06/13/19 1216 11/01/19 1513     ADL [facility]   ADL Phase  Entry  Entry  Exit   SOB Score total  58  --  53     CAT Score   CAT Score  17  --  19     mMRC Score   mMRC Score  --  4  2      Initial Exercise Prescription: Initial Exercise Prescription - 06/13/19 1200      Date of Initial Exercise RX and Referring Provider   Date  06/13/19    Referring Provider  Dr. [name]      Oxygen   Oxygen  Continuous    Liters  4      Treadmill   MPH  1.2    Grade  1    Minutes  15      NuStep   Level  2    SPM  80    Minutes  15      Prescription Details   Frequency (times per week)  2    Duration  Progress to 30 minutes of continuous aerobic without signs/symptoms of physical distress      Intensity   THRR 40-80% of Max Heartrate  63-126    Ratings of Perceived Exertion  11-13    Perceived Dyspnea  0-4      Progression   Progression  Continue progressive overload as per policy without signs/symptoms or physical distress.      Resistance Training   Training Prescription  Yes    Weight  blue bands    Reps  10-15       Discharge  Exercise Prescription (Final Exercise Prescription Changes): Exercise Prescription Changes - 10/25/19 1400      Response to Exercise   Blood Pressure (Admit)  114/64    Blood Pressure (Exercise)  150/70    Blood Pressure (Exit)  106/64    Heart Rate (Admit)  78 bpm    Heart Rate (Exercise)  107 bpm    Heart Rate (Exit)  88 bpm    Oxygen Saturation (Admit)  99 %    Oxygen Saturation (Exercise)  95 %    Oxygen Saturation (Exit)  98 %    Rating of Perceived Exertion (Exercise)  11    Perceived Dyspnea (Exercise)  2    Duration  Continue with 30 min of aerobic exercise without signs/symptoms of physical distress.    Intensity  THRR unchanged      Progression   Progression  Continue to progress workloads to maintain intensity without  signs/symptoms of physical distress.      Resistance Training   Training Prescription  Yes    Weight  blue bands    Reps  10-15    Time  10 Minutes      Oxygen   Oxygen  Continuous    Liters  10-15      Treadmill   MPH  1.9    Grade  2    Minutes  15      NuStep   Level  5    SPM  80    Minutes  15    METs  2.7       Functional Capacity: 6 Minute Walk    Row Name 06/13/19 1217 07/05/19 1637 11/01/19 1509     6 Minute Walk   Phase  Initial  Mid Program  Discharge   Distance  659 feet  800 feet  1118 feet   Distance % Change  --  --  69.65 %   Distance Feet Change  --  --  459 ft   Walk Time  5 minutes  5 minutes  6 minutes   # of Rest Breaks  1  1  0   MPH  1.25  1.52  2.12   METS  2.01  2.35  2.75   RPE  12  13  11    Perceived Dyspnea   2  1  1    VO2 Peak  7.05  8.22  9.62   Symptoms  Yes (comment)  Yes (comment)  No   Comments  1 min standing rest due to desat  1 min standing rest due to desat  --   Resting HR  75 bpm  91 bpm  65 bpm   Resting BP  144/90  128/74  114/60   Resting Oxygen Saturation   100 %  98 %  100 %   Exercise Oxygen Saturation  during 6 min walk  80 %  83 %  92 %   Max Ex. HR  121 bpm  127 bpm  109 bpm    Max Ex. BP  164/92  162/60  164/86   2 Minute Post BP  130/78  128/70  132/72     Interval HR   1 Minute HR  106  108  91   2 Minute HR  117  127  97   3 Minute HR  111  96  99   4 Minute HR  101  109  108   5 Minute HR  114  116  106   6 Minute HR  121  121  109   2 Minute Post HR  97  101  91   Interval Heart Rate?  Yes  Yes  Yes     Interval Oxygen   Interval Oxygen?  Yes  Yes  Yes   Baseline Oxygen Saturation %  100 %  98 %  100 %   1 Minute Oxygen Saturation %  98 %  95 %  100 %   1 Minute Liters of Oxygen  3 L  4 L  15 L   2 Minute Oxygen Saturation %  81 %  83 %  98 %   2 Minute Liters of Oxygen  3 L  4 L  15 L   3 Minute Oxygen Saturation %  80 %  89 %  97 %   3 Minute Liters of Oxygen  4 L  6 L  15 L   4 Minute Oxygen Saturation %  96 %  94 %  97 %   4 Minute Liters of Oxygen  4 L  6 L  15 L   5 Minute Oxygen Saturation %  93 %  91 %  94 %   5 Minute Liters of Oxygen  4 L  6 L  15 L   6 Minute Oxygen Saturation %  86 %  87 %  92 %   6 Minute Liters of Oxygen  4 L  6 L  15 L   2 Minute Post Oxygen Saturation %  91 %  98 %  100 %   2 Minute Post Liters of Oxygen  3 L  6 L  15 L      Psychological, QOL, Others - Outcomes: PHQ 2/9: Depression screen [facility] 2/9 06/13/2019 06/13/2019  Decreased Interest 0 0  Down, Depressed, Hopeless 0 0  PHQ - 2 Score 0 0  Altered sleeping 0 -  Tired, decreased energy 0 -  Change in appetite 0 -  Feeling bad or failure about yourself  0 -  Trouble concentrating 0 -  Moving slowly or fidgety/restless 0 -  Suicidal thoughts 0 -  PHQ-9 Score 0 -  Difficult doing work/chores Not difficult at all -    Quality of Life:   Personal Goals: Goals established at orientation with interventions provided to work toward goal. Personal Goals and Risk Factors at Admission - 07/04/19 1340      Core Components/Risk Factors/Patient Goals on Admission   Improve shortness of breath with ADL's  Yes    Intervention  Provide education,  individualized exercise plan and daily activity instruction to help decrease symptoms of SOB with activities of daily living.    Expected Outcomes  Short Term: Improve cardiorespiratory fitness to achieve a reduction of symptoms when performing ADLs;Long Term: Be able to perform more ADLs without symptoms or delay the onset of symptoms        Personal Goals Discharge: Goals and Risk Factor Review    Row Name 07/04/19 1340 09/05/19 1420 10/10/19 1403         Core Components/Risk Factors/Patient Goals Review   Personal Goals Review  Develop more efficient breathing techniques such as purse lipped breathing and diaphragmatic breathing and practicing self-pacing with activity.;Increase knowledge of respiratory medications and ability to use respiratory devices properly.;Improve shortness of breath with ADL's  Weight Management/Obesity;Develop more efficient breathing techniques such as purse lipped breathing and diaphragmatic breathing and practicing self-pacing with activity.;Increase knowledge of respiratory medications and ability to use respiratory devices properly.;Improve shortness of breath with ADL's  Weight Management/Obesity;Develop more efficient breathing techniques such as purse lipped breathing and diaphragmatic breathing and practicing self-pacing with activity.;Increase knowledge of respiratory medications and ability to use respiratory devices properly.;Improve shortness of breath with ADL's     Review  Brent Holloway just started the program, has attended 4 exercise sessions, too early to have met any program goals.  Department closure x 7 weeks, will reopen 2/23 and continue working on his admission goals.  He has lost 6 kg in the last 3 months, [facility] is requiring this in anticipation of a lung transplant.  He is exercising at level 4 on the nustep on 10 L/min of supplemental oxygen, and walking @ 1.9 mph and 2% incline on the treadmill requiring 15 L/min of oxygen     Expected Outcomes  See  admission goals.  See admission goals.  See  admission goals.        Exercise Goals and Review: Exercise Goals    Row Name 06/13/19 1223 07/04/19 1008 09/06/19 0916 10/11/19 0829       Exercise Goals   Increase Physical Activity  Yes  Yes  Yes  Yes    Intervention  Provide advice, education, support and counseling about physical activity/exercise needs.;Develop an individualized exercise prescription for aerobic and resistive training based on initial evaluation findings, risk stratification, comorbidities and participant's personal goals.  Provide advice, education, support and counseling about physical activity/exercise needs.;Develop an individualized exercise prescription for aerobic and resistive training based on initial evaluation findings, risk stratification, comorbidities and participant's personal goals.  Provide advice, education, support and counseling about physical activity/exercise needs.;Develop an individualized exercise prescription for aerobic and resistive training based on initial evaluation findings, risk stratification, comorbidities and participant's personal goals.  Provide advice, education, support and counseling about physical activity/exercise needs.;Develop an individualized exercise prescription for aerobic and resistive training based on initial evaluation findings, risk stratification, comorbidities and participant's personal goals.    Expected Outcomes  Short Term: Attend rehab on a regular basis to increase amount of physical activity.;Long Term: Add in home exercise to make exercise part of routine and to increase amount of physical activity.;Long Term: Exercising regularly at least 3-5 days a week.  Short Term: Attend rehab on a regular basis to increase amount of physical activity.;Long Term: Add in home exercise to make exercise part of routine and to increase amount of physical activity.;Long Term: Exercising regularly at least 3-5 days a week.  Short Term: Attend  rehab on a regular basis to increase amount of physical activity.;Long Term: Add in home exercise to make exercise part of routine and to increase amount of physical activity.;Long Term: Exercising regularly at least 3-5 days a week.  Short Term: Attend rehab on a regular basis to increase amount of physical activity.;Long Term: Add in home exercise to make exercise part of routine and to increase amount of physical activity.;Long Term: Exercising regularly at least 3-5 days a week.    Increase Strength and Stamina  Yes  Yes  Yes  Yes    Intervention  Provide advice, education, support and counseling about physical activity/exercise needs.;Develop an individualized exercise prescription for aerobic and resistive training based on initial evaluation findings, risk stratification, comorbidities and participant's personal goals.  Provide advice, education, support and counseling about physical activity/exercise needs.;Develop an individualized exercise prescription for aerobic and resistive training based on initial evaluation findings, risk stratification, comorbidities and participant's personal goals.  Provide advice, education, support and counseling about physical activity/exercise needs.;Develop an individualized exercise prescription for aerobic and resistive training based on initial evaluation findings, risk stratification, comorbidities and participant's personal goals.  Provide advice, education, support and counseling about physical activity/exercise needs.;Develop an individualized exercise prescription for aerobic and resistive training based on initial evaluation findings, risk stratification, comorbidities and participant's personal goals.    Expected Outcomes  Short Term: Increase workloads from initial exercise prescription for resistance, speed, and METs.;Short Term: Perform resistance training exercises routinely during rehab and add in resistance training at home;Long Term: Improve  cardiorespiratory fitness, muscular endurance and strength as measured by increased METs and functional capacity (6MWT)  Short Term: Increase workloads from initial exercise prescription for resistance, speed, and METs.;Short Term: Perform resistance training exercises routinely during rehab and add in resistance training at home;Long Term: Improve cardiorespiratory fitness, muscular endurance and strength as measured by increased METs and functional capacity (6MWT)  Short Term: Increase workloads from initial exercise prescription for resistance, speed, and METs.;Short Term: Perform resistance training exercises routinely during rehab and add in resistance training at home;Long Term: Improve cardiorespiratory fitness, muscular endurance and strength as measured by increased METs and functional capacity (6MWT)  Short Term: Increase workloads from initial exercise prescription for resistance, speed, and METs.;Short Term: Perform resistance training exercises routinely during rehab and add in resistance training at home;Long Term: Improve cardiorespiratory fitness, muscular endurance and strength as measured by increased METs and functional capacity (6MWT)    Able to understand and use rate of perceived exertion (RPE) scale  Yes  Yes  Yes  Yes    Intervention  Provide education and explanation on how to use RPE scale  Provide education and explanation on how to use RPE scale  Provide education and explanation on how to use RPE scale  Provide education and explanation on how to use RPE scale    Expected Outcomes  Short Term: Able to use RPE daily in rehab to express subjective intensity level;Long Term:  Able to use RPE to guide intensity level when exercising independently  Short Term: Able to use RPE daily in rehab to express subjective intensity level;Long Term:  Able to use RPE to guide intensity level when exercising independently  Short Term: Able to use RPE daily in rehab to express subjective intensity  level;Long Term:  Able to use RPE to guide intensity level when exercising independently  Short Term: Able to use RPE daily in rehab to express subjective intensity level;Long Term:  Able to use RPE to guide intensity level when exercising independently    Able to understand and use Dyspnea scale  Yes  Yes  Yes  Yes    Intervention  Provide education and explanation on how to use Dyspnea scale  Provide education and explanation on how to use Dyspnea scale  Provide education and explanation on how to use Dyspnea scale  Provide education and explanation on how to use Dyspnea scale    Expected Outcomes  Short Term: Able to use Dyspnea scale daily in rehab to express subjective sense of shortness of breath during exertion;Long Term: Able to use Dyspnea scale to guide intensity level when exercising independently  Short Term: Able to use Dyspnea scale daily in rehab to express subjective sense of shortness of breath during exertion;Long Term: Able to use Dyspnea scale to guide intensity level when exercising independently  Short Term: Able to use Dyspnea scale daily in rehab to express subjective sense of shortness of breath during exertion;Long Term: Able to use Dyspnea scale to guide intensity level when exercising independently  Short Term: Able to use Dyspnea scale daily in rehab to express subjective sense of shortness of breath during exertion;Long Term: Able to use Dyspnea scale to guide intensity level when exercising independently    Knowledge and understanding of Target Heart Rate Range (THRR)  Yes  Yes  Yes  Yes    Intervention  --  Provide education and explanation of THRR including how the numbers were predicted and where they are located for reference  Provide education and explanation of THRR including how the numbers were predicted and where they are located for reference  Provide education and explanation of THRR including how the numbers were predicted and where they are located for reference     Expected Outcomes  Short Term: Able to state/look up THRR;Short Term: Able to use daily as guideline for intensity in rehab;Long Term: Able to  use THRR to govern intensity when exercising independently  Short Term: Able to state/look up THRR;Short Term: Able to use daily as guideline for intensity in rehab;Long Term: Able to use THRR to govern intensity when exercising independently  Short Term: Able to state/look up THRR;Short Term: Able to use daily as guideline for intensity in rehab;Long Term: Able to use THRR to govern intensity when exercising independently  Short Term: Able to state/look up THRR;Short Term: Able to use daily as guideline for intensity in rehab;Long Term: Able to use THRR to govern intensity when exercising independently    Understanding of Exercise Prescription  Yes  Yes  Yes  Yes    Intervention  Provide education, explanation, and written materials on patient's individual exercise prescription  Provide education, explanation, and written materials on patient's individual exercise prescription  Provide education, explanation, and written materials on patient's individual exercise prescription  Provide education, explanation, and written materials on patient's individual exercise prescription    Expected Outcomes  Short Term: Able to explain program exercise prescription;Long Term: Able to explain home exercise prescription to exercise independently  Short Term: Able to explain program exercise prescription;Long Term: Able to explain home exercise prescription to exercise independently  Short Term: Able to explain program exercise prescription;Long Term: Able to explain home exercise prescription to exercise independently  Short Term: Able to explain program exercise prescription;Long Term: Able to explain home exercise prescription to exercise independently       Exercise Goals Re-Evaluation: Exercise Goals Re-Evaluation    Row Name 07/04/19 1009 09/06/19 0916 10/11/19 0830          Exercise Goal Re-Evaluation   Exercise Goals Review  Increase Physical Activity;Increase Strength and Stamina;Able to understand and use rate of perceived exertion (RPE) scale;Able to understand and use Dyspnea scale;Knowledge and understanding of Target Heart Rate Range (THRR);Understanding of Exercise Prescription  Increase Physical Activity;Increase Strength and Stamina;Able to understand and use rate of perceived exertion (RPE) scale;Able to understand and use Dyspnea scale;Knowledge and understanding of Target Heart Rate Range (THRR);Understanding of Exercise Prescription  Increase Physical Activity;Increase Strength and Stamina;Able to understand and use rate of perceived exertion (RPE) scale;Able to understand and use Dyspnea scale;Knowledge and understanding of Target Heart Rate Range (THRR);Understanding of Exercise Prescription     Comments  Pt has completed 4 exercise sessions. It was determined after his first two visits that he needs 8 L while walking on the treadmill. I will be able to progress pt now that his oxygen needs have been determined. Pt currently exercises at 2.1 METs on the stepper. Will continue to monitor and progress as able.  Pt has been walking on his treadmill since we have ceased in person exercise. Pt will return 2/23. Will monitor and progress as able.  Pt has completed 17 exercise sessions. Pt is still progressing. He is motivated to lose ~70 lbs so he can be listed on [facility]'s lung transplant list. He is currently exercising at 2.4 METs on the stepper. Will continue to monitor and progress as able.     Expected Outcomes  Through exercise at rehab and at home, the patient will decrease shortness of breath with daily activities and feel confident in carrying out an exercise regime at home.  Through exercise at rehab and at home, the patient will decrease shortness of breath with daily activities and feel confident in carrying out an exercise regime at home.  Through exercise at  rehab and at home, the patient will decrease shortness of  breath with daily activities and feel confident in carrying out an exercise regime at home.        Nutrition & Weight - Outcomes: Pre Biometrics - 06/13/19 1159      Pre Biometrics   Grip Strength  45 kg        Nutrition: Nutrition Therapy & Goals - 06/28/19 1517      Personal Nutrition Goals   Nutrition Goal  Pt to identify food quantities necessary to achieve weight loss of 6-24 lb at graduation from pulmonary rehab.    Personal Goal #2  Pt to identify and limit food sources of sodium      Intervention Plan   Intervention  Prescribe, educate and counsel regarding individualized specific dietary modifications aiming towards targeted core components such as weight, hypertension, lipid management, diabetes, heart failure and other comorbidities.;Nutrition handout(s) given to patient.    Expected Outcomes  Short Term Goal: Understand basic principles of dietary content, such as calories, fat, sodium, cholesterol and nutrients.;Short Term Goal: A plan has been developed with personal nutrition goals set during dietitian appointment.;Long Term Goal: Adherence to prescribed nutrition plan.       Nutrition Discharge: Nutrition Assessments - 11/10/19 0714      Rate Your Plate Scores   Pre Score %  --    Post Score  53      MEDFICTS Scores   Post Score  --       Education Questionnaire Score: Knowledge Questionnaire Score - 11/01/19 1515      Knowledge Questionnaire Score   Post Score  16/18       Goals reviewed with patient; copy given to patient.

## 2019-11-28 ENCOUNTER — Other Ambulatory Visit: Payer: Self-pay | Admitting: *Deleted

## 2019-11-28 ENCOUNTER — Telehealth: Payer: Self-pay | Admitting: Pulmonary Disease

## 2019-11-28 DIAGNOSIS — Z5181 Encounter for therapeutic drug level monitoring: Secondary | ICD-10-CM

## 2019-11-28 NOTE — Telephone Encounter (Signed)
Brent Holloway this is an Brent Holloway All patient, Please advise last OV 10/17/19.  Patient states needs labwork. Patient gets labwork for being on [facility] med.

## 2019-11-28 NOTE — Telephone Encounter (Signed)
Returned call to patient and made aware we do not have phlebotomist in office right now. He would need to go to [place] office. Pt prefers to go to [facility] b/c his wife works there and he can get free labs. Order replaced for [facility]. Nothing further needed.

## 2019-11-28 NOTE — Telephone Encounter (Signed)
Attempted to call pt but line went straight to VM. Left message for pt to return call. 

## 2019-11-28 NOTE — Telephone Encounter (Signed)
There is already an order for [facility] in the computer. Please let patient know he can come and get the labs drawn at his convenience. Thanks

## 2019-11-29 NOTE — Telephone Encounter (Signed)
Called and spoke with pt letting him know that the order was in place for the labwork and he verbalized understanding. Pt stated that he is on his way to [facility] lab to have labwork drawn. Nothing further needed.

## 2019-11-30 LAB — COMPREHENSIVE METABOLIC PANEL
ALT: 14 IU/L (ref 0–44)
AST: 16 IU/L (ref 0–40)
Albumin/Globulin Ratio: 1.9 (ref 1.2–2.2)
Albumin: 4.3 g/dL (ref 3.8–4.8)
Alkaline Phosphatase: 70 IU/L (ref 39–117)
BUN/Creatinine Ratio: 15 (ref 10–24)
BUN: 13 mg/dL (ref 8–27)
Bilirubin Total: 0.7 mg/dL (ref 0.0–1.2)
CO2: 25 mmol/L (ref 20–29)
Calcium: 9.3 mg/dL (ref 8.6–10.2)
Chloride: 104 mmol/L (ref 96–106)
Creatinine, Ser: 0.89 mg/dL (ref 0.76–1.27)
GFR calc Af Amer: 104 mL/min/{1.73_m2} (ref >59)
GFR calc non Af Amer: 90 mL/min/{1.73_m2} (ref >59)
Globulin, Total: 2.3 g/dL (ref 1.5–4.5)
Glucose: 92 mg/dL (ref 65–99)
Potassium: 4.6 mmol/L (ref 3.5–5.2)
Sodium: 141 mmol/L (ref 134–144)
Total Protein: 6.6 g/dL (ref 6.0–8.5)

## 2019-12-02 ENCOUNTER — Other Ambulatory Visit: Payer: Self-pay | Admitting: Pulmonary Disease

## 2019-12-02 DIAGNOSIS — J84112 Idiopathic pulmonary fibrosis: Secondary | ICD-10-CM

## 2019-12-02 NOTE — Progress Notes (Signed)
Noted.   Brent Holloway

## 2020-01-09 ENCOUNTER — Other Ambulatory Visit: Payer: Self-pay

## 2020-01-09 ENCOUNTER — Ambulatory Visit (INDEPENDENT_AMBULATORY_CARE_PROVIDER_SITE_OTHER)
Admission: RE | Admit: 2020-01-09 | Discharge: 2020-01-09 | Disposition: A | Discharge status: Home / Self Care | Payer: Managed Care, Other (non HMO) | Source: Ambulatory Visit | Attending: Pulmonary Disease | Admitting: Pulmonary Disease

## 2020-01-09 DIAGNOSIS — R918 Other nonspecific abnormal finding of lung field: Secondary | ICD-10-CM

## 2020-01-16 NOTE — Progress Notes (Signed)
Virtual Visit via Telephone Note  I connected with Brent Holloway on 01/17/20 at  9:30 AM EDT by telephone and verified that I am speaking with the correct person using two identifiers.  Location: Patient: Home Provider: Office [name] - [address] [place], [address], [place], [place] [address]   I discussed the limitations, risks, security and privacy concerns of performing an evaluation and management service by telephone and the availability of in person appointments. I also discussed with the patient that there may be a patient responsible charge related to this service. The patient expressed understanding and agreed to proceed.  Patient consented to consult via telephone: Yes People present and their role in pt care: Pt     History of Present Illness:  64 year old male former smoker followed in our office for interstitial lung disease, chronic respiratory failure, IPF, obstructive sleep apnea  Past medical history: History of leg DVT, morbid obesity Smoking history: Former smoker Maintenance: Patient of Dr. Everardo Holloway  Chief complaint: 44-month follow-up   64 year old male former smoker completing a 87-month follow-up with our office.  Last seen by Dr. Everardo Holloway back in March/2021.  Plan of care from that office visit was for him to be maintained on CPAP for treatment of obstructive sleep apnea, patient to continue to follow-up with [facility] for lung transplant, work towards weight loss, continue pulmonary rehab Continue Ofev, continue lab work for monitoring of Ofev, continue omeprazole and Breo Ellipta 200, continue supplemental oxygen patient with known right upper lobe pulmonary nodule as well as mediastinal and hilar lymphadenopathy, patient felt to be high risk for bronchoscopy with biopsy given current O2 requirements and lesion with low likelihood to be cancer.  We will hold off on repeat CT chest at this point in time.  Doing ok  Weight down to 252 this am  breo 200  Sees [facility]  pulmonary dr Brent Holloway  12/20/19    Plan: We will review the patient in our multidisciplinary listing conference. After that meeting, we will provide the patient and referring provider with a formal decision regarding whether this patient has potential to become a candidate for lung transplant at our center.  At this time, I recommend the patient do the following: increase his knowledge of lung transplant, lose weight to a goal of 215 pounds, update Holloway age appropriate vaccinations and cancer screening and continue regular exercise to maintain functional status. Based on currently available data and my assessment, this patient appears to be a(n) reasonable candidate for lung transplant, if able to lose weight he would be a good transplant candidate.   I would like for the patient to return to clinic in 3 months for ongoing evaluation of disease state and progress toward lung transplant goals.  I think he can RTC as a limited evaluation assuming he continues to lose weight.  He can increase his walking to 5x a week.  He can try humidified air to see if that helps his hoarseness, otherwise he needs to see ENT for eval.  Continue to work on weight loss.  Potassium was a little high today- he eats alot of tomatoes, potatoes. I told him to limit those some.   Patient reporting that since seeing follow-up with [facility] he is been walking 4 days a week.  He walks for 30 minutes at a time.  He feels that his breathing has improved.  He reports that his lung functioning at last office visit in June/2021 with [facility] had improved as well.  He  remains adherent to Ofev.  His repeat lab work at [facility] was stable for his liver functioning.  He will need to need repeat labs next month and in August.  He started Ofev in February/2021.  Most recent CT imaging in June/2021 does show progression of his fibrosis.  In comparison to October/2020 CT.  Plan is to repeat pulmonary function testing and follow-up with [facility] in  September/2021.  He remains adherent to [employer].  Patient reports adherence to CPAP therapy.  He reports that he hates CPAP therapy.  Observations/Objective:  Imaging: CT chest 07/08/2017 - Upper lobe predominant L>R subpleural reticulation with cystic-appearing lesions that may represent early honeycombing CT Chest 05/09/19 - Progressive subpleural reticulation associated with GGO with diffuse involvement predominantly worse in the basilar regions with honeycombing and traction bronchiectasis. RUL nodule also noted to be increased 42mm>>9mm compared to 07/09/19 imaging. PET/CT 08/09/19 - Progressive subpleural reticulation with GGO with diffuse involvement with honeycombing and traction bronchiectasis. Unchange 8-20mm right upper lobe lung nodule with low-level hypermetabolic activity. Activity level is similar to what is found diffusely throughout lung parenchyma  PFT: 04/15/19 FVC 3.29 (67%) FEV1 2.93 (80%) Ratio 87  TLC 61% DLCO 50% Interpretation: Mild restrictive defect with moderately reduced DLCO  Labs: ANA - neg HIV ab - nonreactive Anti-scleroderma ab - <0.2 Anti-ds DNA ab - 1 Aldolase - 7 CK - 129 ANCA screen - neg Anti-ribonucleic acid ab - 11.2 (neg) RF 13.1 SSA ab 0.5 SSB ab 0.2 CCP ab 6  Ambulatory O2 07/05/19 Patient Saturations on Room Air at Rest = 98% Patient Saturations on Room Air while Ambulating = 86% Patient Saturations on 3 Liters of oxygen while Ambulating = 92% CONCLUSION: Recommend 3L O2 with exertion and sleep  Ambulatory O2 08/11/19 Patient Saturations on Room Air at Rest = 91% Patient Saturations on Room Air while Ambulating = 83% Patient Saturations on 10 Liters of oxygen while Ambulating = 93%  Home sleep study 06/13/19 Severe sleep apnea AHI 34.1. Recommend CPAP sleep titration study  06/13/19 - 659 feet. Required 4L   Social History   Tobacco Use  Smoking Status Former Smoker  . Packs/day: 0.25  . Years: 44.00  . Pack  years: 11.00  . Types: Cigars  . Start date: 40  . Quit date: 03/29/2017  . Years since quitting: 2.8  Smokeless Tobacco Never Used  Tobacco Comment   5-6 cigars daily    Immunization History  Administered Date(s) Administered  . Influenza Inj Mdck Quad Pf 03/23/2019  . PFIZER SARS-COV-2 Vaccination 10/12/2019  . Pneumococcal Polysaccharide-23 09/23/2019      Assessment and Plan:  OSA (obstructive sleep apnea) Plan: Continue CPAP  IPF (idiopathic pulmonary fibrosis) (HCC) Plan: Continue Ofev Lab work next week Continue follow-up with [facility] Continue follow-up with [facility] transplant Obtain pulmonary function testing at [facility] in September/2021  Interstitial lung disease (HCC) Plan: Continue Ofev Continue follow-up with [facility]/transplant Follow-up with our office in 4 months Lab work next week and in 4 weeks  Chronic hypoxemic respiratory failure (HCC) Plan: Continue oxygen therapy  Morbid obesity due to excess calories (HCC) Plan: Continue to work on improving overall physical activity Goal set by [facility] transplant is to have weight loss down to 215 pounds    Follow Up Instructions:  Return in about 4 months (around 05/18/2020), or if symptoms worsen or fail to improve, for Follow up with Dr. Everardo Holloway.   I discussed the assessment and treatment plan with the  patient. The patient was provided an opportunity to ask questions and Holloway were answered. The patient agreed with the plan and demonstrated an understanding of the instructions.   The patient was advised to call back or seek an in-person evaluation if the symptoms worsen or if the condition fails to improve as anticipated.  I provided 25 minutes of non-face-to-face time during this encounter.   Coral Ceo, NP

## 2020-01-17 ENCOUNTER — Encounter: Payer: Self-pay | Admitting: Pulmonary Disease

## 2020-01-17 ENCOUNTER — Ambulatory Visit (INDEPENDENT_AMBULATORY_CARE_PROVIDER_SITE_OTHER): Payer: Managed Care, Other (non HMO) | Admitting: Pulmonary Disease

## 2020-01-17 ENCOUNTER — Other Ambulatory Visit: Payer: Self-pay

## 2020-01-17 DIAGNOSIS — J9611 Chronic respiratory failure with hypoxia: Secondary | ICD-10-CM | POA: Diagnosis not present

## 2020-01-17 DIAGNOSIS — G4733 Obstructive sleep apnea (adult) (pediatric): Secondary | ICD-10-CM

## 2020-01-17 DIAGNOSIS — J84112 Idiopathic pulmonary fibrosis: Secondary | ICD-10-CM | POA: Diagnosis not present

## 2020-01-17 DIAGNOSIS — J849 Interstitial pulmonary disease, unspecified: Secondary | ICD-10-CM | POA: Diagnosis not present

## 2020-01-17 NOTE — Assessment & Plan Note (Signed)
Plan: Continue Ofev Continue follow-up with [facility]/transplant Follow-up with our office in 4 months Lab work next week and in 4 weeks

## 2020-01-17 NOTE — Patient Instructions (Addendum)
You were seen today by Coral Ceo, NP  for:   1. IPF (idiopathic pulmonary fibrosis) (HCC) 2. Interstitial lung disease (HCC)  Continue Ofev  Lab work next week as well as first week of August  Continue follow-up with [facility]  Continue follow-up with [facility]  Breo Ellipta 200 >>> Take 1 puff daily in the morning right when you wake up >>>Rinse your mouth out after use >>>This is a daily maintenance inhaler, NOT a rescue inhaler >>>Contact our office if you are having difficulties affording or obtaining this medication >>>It is important for you to be able to take this daily and not miss any doses  Follow-up in 4 months  3. Chronic hypoxemic respiratory failure (HCC)  Continue oxygen therapy as prescribed  >>>maintain oxygen saturations greater than 88 percent  >>>if unable to maintain oxygen saturations please contact the office  >>>do not smoke with oxygen  >>>can use nasal saline gel or nasal saline rinses to moisturize nose if oxygen causes dryness  4. OSA (obstructive sleep apnea)  We recommend that you continue using your CPAP daily >>>Keep up the hard work using your device >>> Goal should be wearing this for the entire night that you are sleeping, at least 4 to 6 hours  Remember:  . Do not drive or operate heavy machinery if tired or drowsy.  . Please notify the supply company and office if you are unable to use your device regularly due to missing supplies or machine being broken.  . Work on maintaining a healthy weight and following your recommended nutrition plan  . Maintain proper daily exercise and movement  . Maintaining proper use of your device can also help improve management of other chronic illnesses such as: Blood pressure, blood sugars, and weight management.   BiPAP/ CPAP Cleaning:  >>>Clean weekly, with Dawn soap, and bottle brush.  Set up to air dry. >>> Wipe mask out daily with wet wipe or towelette   5. Morbid obesity due to  excess calories (HCC)  Continue to work on increasing your physical activity  Goal should be walking 5 times a week for 30 minutes at a time  Follow Up:    Return in about 4 months (around 05/18/2020), or if symptoms worsen or fail to improve, for Follow up with Dr. Everardo All.   Please do your part to reduce the spread of COVID-19:      Reduce your risk of any infection  and COVID19 by using the similar precautions used for avoiding the common cold or flu:  Marland Kitchen Wash your hands often with soap and warm water for at least 20 seconds.  If soap and water are not readily available, use an alcohol-based hand sanitizer with at least 60% alcohol.  . If coughing or sneezing, cover your mouth and nose by coughing or sneezing into the elbow areas of your shirt or coat, into a tissue or into your sleeve (not your hands). Drinda Butts A MASK when in public  . Avoid shaking hands with others and consider head nods or verbal greetings only. . Avoid touching your eyes, nose, or mouth with unwashed hands.  . Avoid close contact with people who are sick. . Avoid places or events with large numbers of people in one location, like concerts or sporting events. . If you have some symptoms but not all symptoms, continue to monitor at home and seek medical attention if your symptoms worsen. . If you are having a medical emergency, call  911.   ADDITIONAL HEALTHCARE OPTIONS FOR PATIENTS  [facility] / e-Visit: [email or web address]         [facility]: [phone]  [facility]: [phone]                   [facility]: [phone]     It is flu season:   >>> Best ways to protect herself from the flu: Receive the yearly flu vaccine, practice good hand hygiene washing with soap and also using hand sanitizer when available, eat a nutritious meals, get adequate rest, hydrate appropriately   Please contact the office if  your symptoms worsen or you have concerns that you are not improving.   Thank you for choosing [facility] for your healthcare, and for allowing [place] to partner with you on your healthcare journey. I am thankful to be able to provide care to you today.   Elisha Headland FNP-C

## 2020-01-17 NOTE — Assessment & Plan Note (Addendum)
Plan: Continue to work on improving overall physical activity Goal set by [facility] transplant is to have weight loss down to 215 pounds

## 2020-01-17 NOTE — Assessment & Plan Note (Signed)
Plan: Continue oxygen therapy 

## 2020-01-17 NOTE — Assessment & Plan Note (Signed)
Plan: Continue Ofev Lab work next week Continue follow-up with [facility] Continue follow-up with [facility] Obtain pulmonary function testing at [facility] in September/2021

## 2020-01-17 NOTE — Assessment & Plan Note (Signed)
Plan: Continue CPAP 

## 2020-01-18 ENCOUNTER — Other Ambulatory Visit: Payer: Self-pay | Admitting: Pulmonary Disease

## 2020-01-18 DIAGNOSIS — Z5181 Encounter for therapeutic drug level monitoring: Secondary | ICD-10-CM

## 2020-01-30 IMAGING — CT CT CHEST HIGH RESOLUTION W/O CM
2 of 5 series · 14 of 36 positions shown, 17 images · non-contrast
Comparison: 07/08/2017 chest CT.

CLINICAL DATA: Worsening dyspnea with exertion for several months.
Follow-up interstitial lung disease.

EXAM:
CT CHEST WITHOUT CONTRAST
TECHNIQUE: Multidetector CT imaging of the chest was performed following the
standard protocol without intravenous contrast. High resolution
imaging of the lungs, as well as inspiratory and expiratory imaging,
was performed.

[Series 2: high resolution · axial · 0.81mm/px · z∈[-349,-69]mm · 11 of 155 slices shown, 14 images]
[im 8/155  mediastinal]
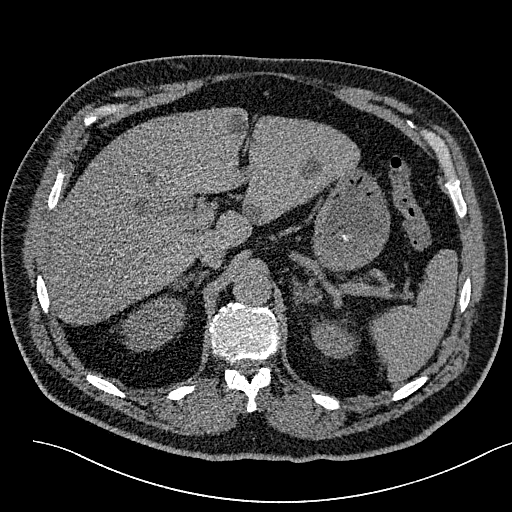
[im 8/155  lung]
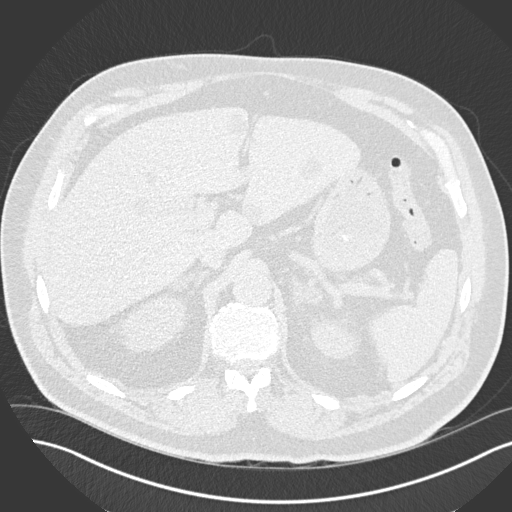
[im 22/155  lung]
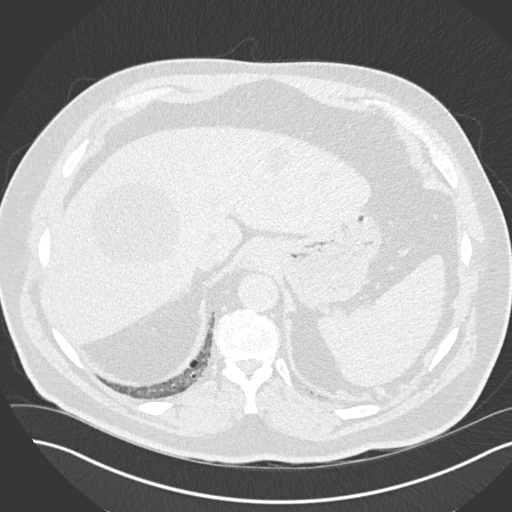
[im 36/155  lung]
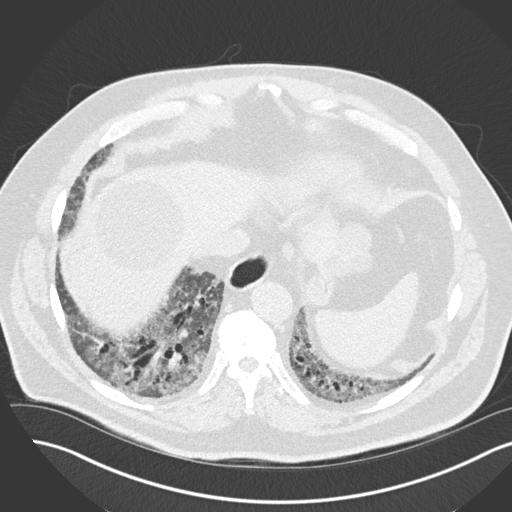
[im 50/155  lung]
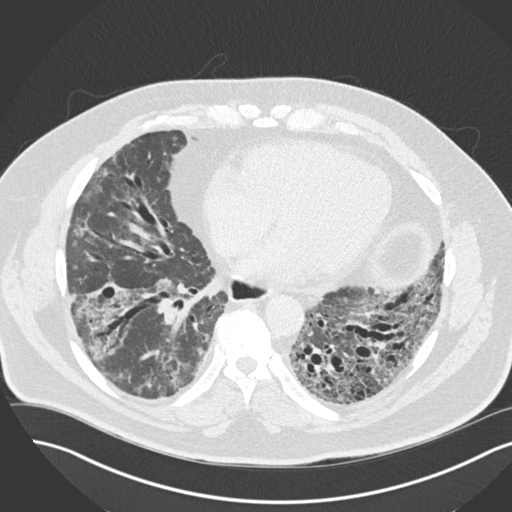
[im 64/155  mediastinal]
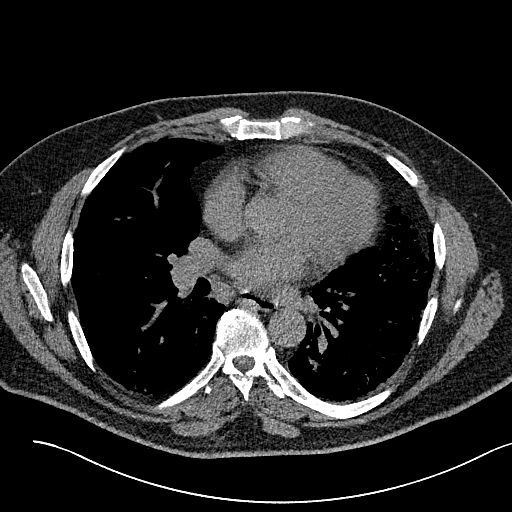
[im 64/155  lung]
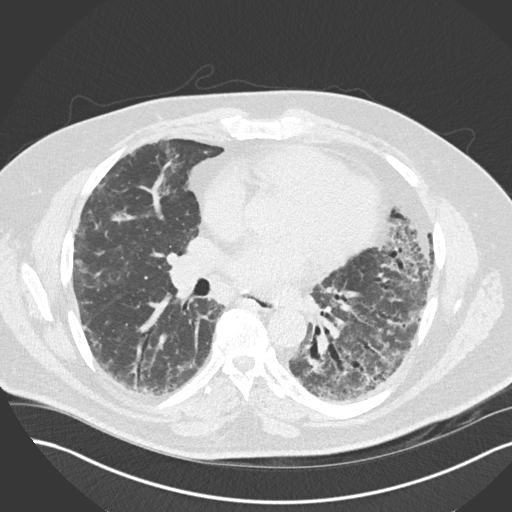
[im 78/155  lung]
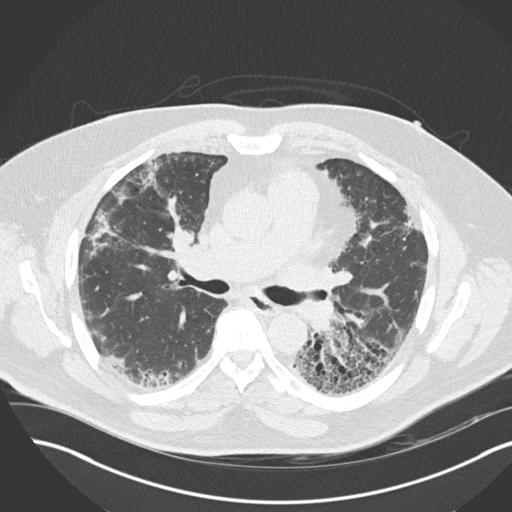
[im 92/155  lung]
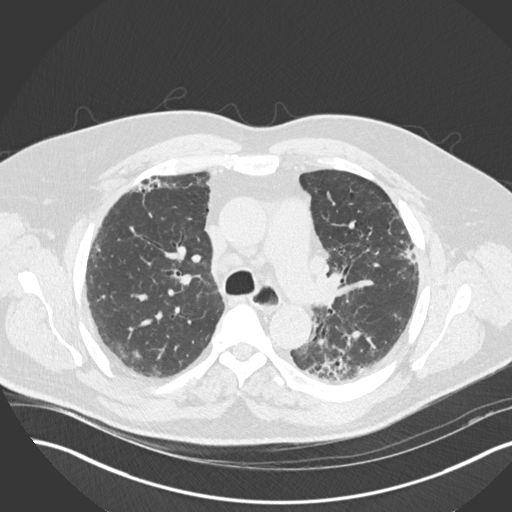
[im 106/155  lung]
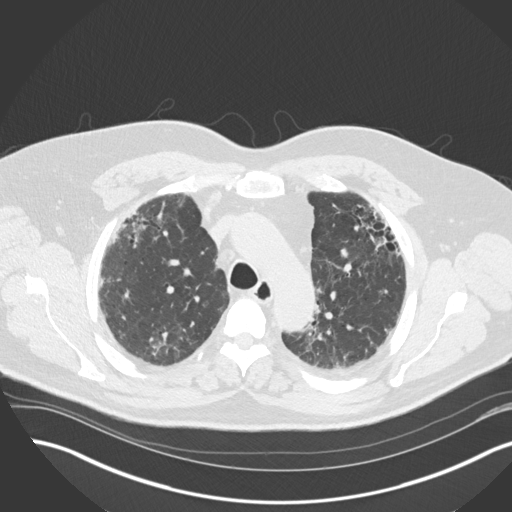
[im 120/155  mediastinal]
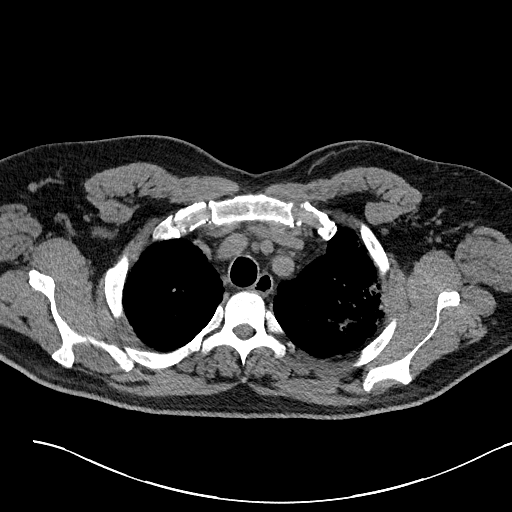
[im 120/155  lung]
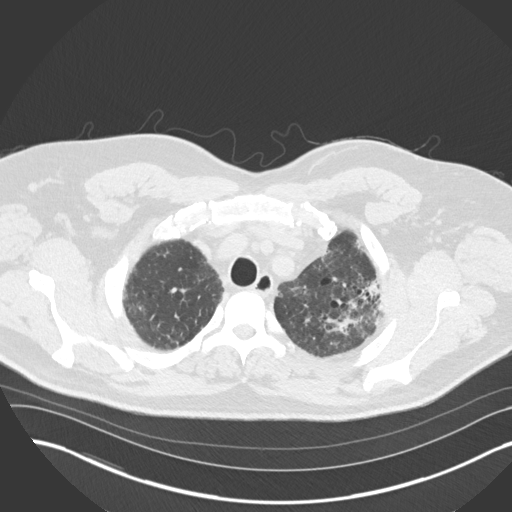
[im 134/155  lung]
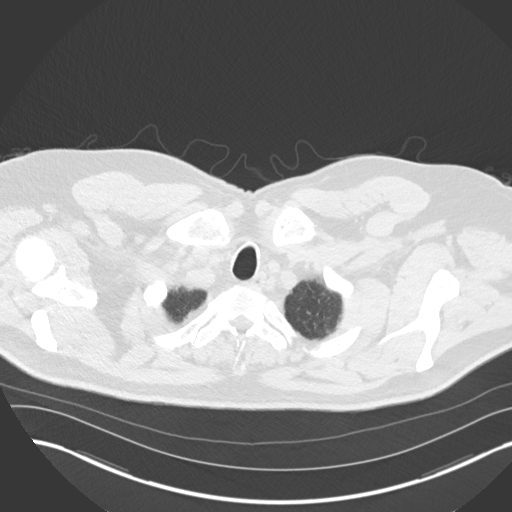
[im 148/155  lung]
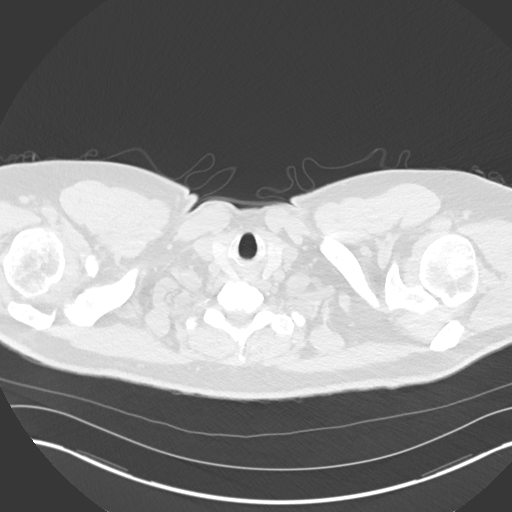

[Series 6: coronal · coronal · 0.63mm/px · 3 of 148 slices shown]
[im 30/148  lung]
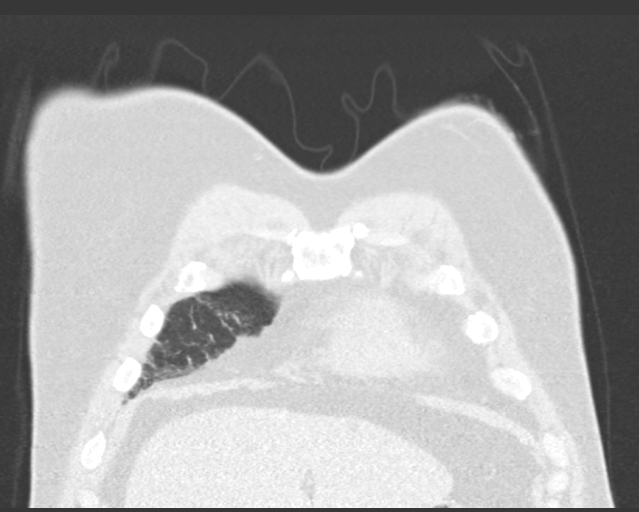
[im 59/148  lung]
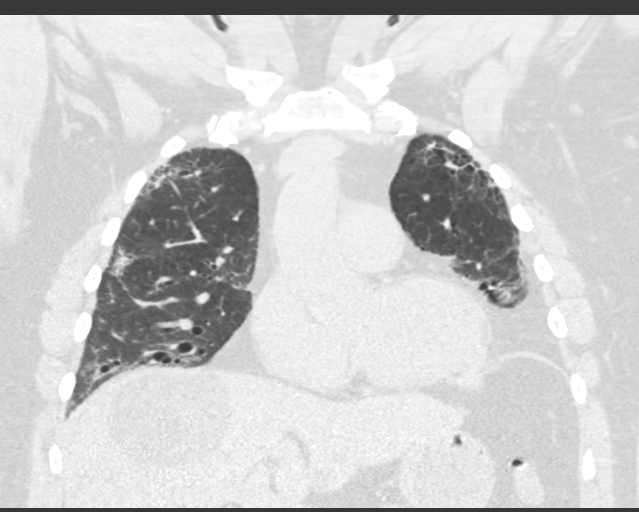
[im 89/148  lung]
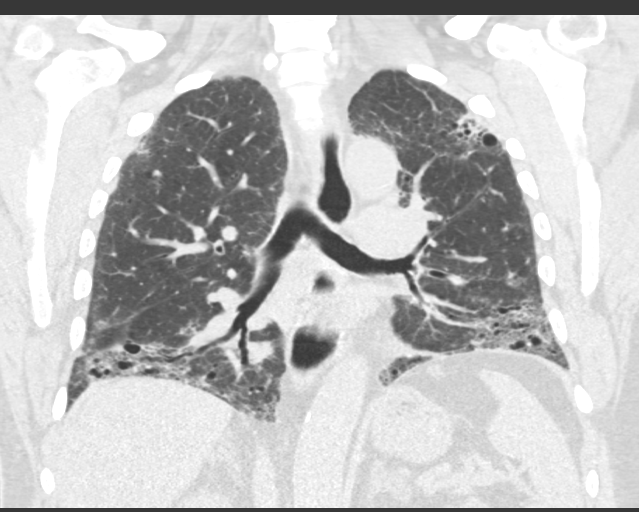

[14 of 36 positions shown; findings below may reference images not displayed]

FINDINGS: Cardiovascular: Normal heart size. No significant pericardial
effusion/thickening. Atherosclerotic nonaneurysmal thoracic aorta.
Dilated main pulmonary artery (3.9 cm diameter), increased from
cm.

Mediastinum/Nodes: No discrete thyroid nodules. Mildly patulous
thoracic esophagus. No axillary adenopathy. Mild right paratracheal
adenopathy up to 1.2 cm (series 2/image 62), new. Enlarged 1.5 cm
right subcarinal node (series 2/image 92), increased from 1.0 cm.
Newly enlarged 1.3 cm AP window node (series 2/image 59). Mild
bilateral hilar adenopathy is poorly delineated on this noncontrast
scan, appearing increased from prior.

Lungs/Pleura: No pneumothorax. No pleural effusion. Mild
centrilobular and paraseptal emphysema. No acute consolidative
airspace disease or lung masses. Posterior right upper lobe 9 mm
solid pulmonary nodule (series 3/image 47), increased from 6 mm on
07/08/2017 chest CT. No additional significant pulmonary nodules. No
significant lobular air trapping on the expiration sequence. No
evidence of tracheobronchomalacia. There is extensive patchy
confluent peripheral reticulation and ground-glass opacity
throughout both lungs with severe traction bronchiectasis and
architectural distortion. There is a strong basilar predominance to
these findings. There is a focus of honeycombing in the left upper
lobe (series 4/image 101). Findings have substantially progressed
since 07/08/2017 chest CT.

Upper abdomen: Numerous simple cysts scattered throughout the
visualized liver, largest 7.4 cm in the right liver lobe.

Musculoskeletal: No aggressive appearing focal osseous lesions. Mild
thoracic spondylosis.
IMPRESSION: 1. Spectrum of findings compatible with basilar predominant fibrotic
interstitial lung disease with mild honeycombing. Substantial
disease progression since 07/08/2017 chest CT. Findings are
consistent with UIP per consensus guidelines: Diagnosis of
Idiopathic Pulmonary Fibrosis: An Official ATS/ERS/JRS/ALAT Clinical
Practice Guideline. Am J Respir Crit Care Med Vol 198, [place] 5,
[id number][DATE].
2. Right upper lobe 9 mm solid pulmonary nodule is increased from 6
mm on 07/08/2017 chest CT. Consider one of the following in 3 months
for both low-risk and high-risk individuals: (a) repeat chest CT,
(b) follow-up PET-CT, or (c) tissue sampling. This recommendation
follows the consensus statement: Guidelines for Management of
Incidental Pulmonary Nodules Detected on CT Images: From the
3. New and increased mild-to-moderate mediastinal and bilateral
hilar lymphadenopathy, nonspecific, most likely reactive.
4. Dilated main pulmonary artery, increased, suggesting worsening
chronic pulmonary arterial hypertension.

Aortic Atherosclerosis (T7GSL-XKP.P) and Emphysema (T7GSL-ZPJ.U).

## 2020-02-10 ENCOUNTER — Other Ambulatory Visit: Payer: Managed Care, Other (non HMO)

## 2020-02-10 DIAGNOSIS — Z5181 Encounter for therapeutic drug level monitoring: Secondary | ICD-10-CM

## 2020-02-10 LAB — COMPREHENSIVE METABOLIC PANEL
ALT: 19 U/L (ref 0–53)
AST: 18 U/L (ref 0–37)
Albumin: 4.4 g/dL (ref 3.5–5.2)
Alkaline Phosphatase: 65 U/L (ref 39–117)
BUN: 13 mg/dL (ref 6–23)
CO2: 28 mEq/L (ref 19–32)
Calcium: 9.6 mg/dL (ref 8.4–10.5)
Chloride: 103 mEq/L (ref 96–112)
Creatinine, Ser: 0.96 mg/dL (ref 0.40–1.50)
GFR: 78.76 mL/min (ref >60.00)
Glucose, Bld: 96 mg/dL (ref 70–99)
Potassium: 4.5 mEq/L (ref 3.5–5.1)
Sodium: 137 mEq/L (ref 135–145)
Total Bilirubin: 0.9 mg/dL (ref 0.2–1.2)
Total Protein: 7.4 g/dL (ref 6.0–8.3)

## 2020-02-10 NOTE — Addendum Note (Signed)
Addended by: Demetrio Lapping E on: 02/10/2020 11:11 AM   Modules accepted: Orders

## 2020-02-10 NOTE — Progress Notes (Signed)
Spoke with pt and notified of results per Dr. Brian Mack. Pt verbalized understanding and denied any questions. 

## 2020-03-06 ENCOUNTER — Other Ambulatory Visit: Payer: Self-pay | Admitting: Pulmonary Disease

## 2020-09-18 ENCOUNTER — Encounter: Payer: Self-pay | Admitting: Pulmonary Disease

## 2020-09-18 ENCOUNTER — Ambulatory Visit: Payer: Managed Care, Other (non HMO) | Admitting: Pulmonary Disease

## 2020-09-18 ENCOUNTER — Other Ambulatory Visit: Payer: Self-pay

## 2020-09-18 VITALS — BP 108/68 | HR 73 | Temp 97.7°F | Ht 71.0 in | Wt 244.1 lb

## 2020-09-18 DIAGNOSIS — R911 Solitary pulmonary nodule: Secondary | ICD-10-CM | POA: Diagnosis not present

## 2020-09-18 DIAGNOSIS — J9611 Chronic respiratory failure with hypoxia: Secondary | ICD-10-CM | POA: Diagnosis not present

## 2020-09-18 DIAGNOSIS — J84112 Idiopathic pulmonary fibrosis: Secondary | ICD-10-CM | POA: Diagnosis not present

## 2020-09-18 DIAGNOSIS — Z77090 Contact with and (suspected) exposure to asbestos: Secondary | ICD-10-CM

## 2020-09-18 DIAGNOSIS — G4733 Obstructive sleep apnea (adult) (pediatric): Secondary | ICD-10-CM

## 2020-09-18 LAB — CBC WITH DIFFERENTIAL/PLATELET
Basophils Absolute: 0.1 10*3/uL (ref 0.0–0.1)
Basophils Relative: 0.8 % (ref 0.0–3.0)
Eosinophils Absolute: 0.2 10*3/uL (ref 0.0–0.7)
Eosinophils Relative: 1.6 % (ref 0.0–5.0)
HCT: 39.2 % (ref 39.0–52.0)
Hemoglobin: 13.6 g/dL (ref 13.0–17.0)
Lymphocytes Relative: 11.5 % — ABNORMAL LOW (ref 12.0–46.0)
Lymphs Abs: 1.1 10*3/uL (ref 0.7–4.0)
MCHC: 34.8 g/dL (ref 30.0–36.0)
MCV: 87 fl (ref 78.0–100.0)
Monocytes Absolute: 0.9 10*3/uL (ref 0.1–1.0)
Monocytes Relative: 9.6 % (ref 3.0–12.0)
Neutro Abs: 7.5 10*3/uL (ref 1.4–7.7)
Neutrophils Relative %: 76.5 % (ref 43.0–77.0)
Platelets: 226 10*3/uL (ref 150.0–400.0)
RBC: 4.5 Mil/uL (ref 4.22–5.81)
RDW: 14.1 % (ref 11.5–15.5)
WBC: 9.9 10*3/uL (ref 4.0–10.5)

## 2020-09-18 LAB — COMPREHENSIVE METABOLIC PANEL
ALT: 15 U/L (ref 0–53)
AST: 17 U/L (ref 0–37)
Albumin: 4.4 g/dL (ref 3.5–5.2)
Alkaline Phosphatase: 61 U/L (ref 39–117)
BUN: 23 mg/dL (ref 6–23)
CO2: 23 mEq/L (ref 19–32)
Calcium: 9.4 mg/dL (ref 8.4–10.5)
Chloride: 106 mEq/L (ref 96–112)
Creatinine, Ser: 1.41 mg/dL (ref 0.40–1.50)
GFR: 52.49 mL/min — ABNORMAL LOW (ref >60.00)
Glucose, Bld: 84 mg/dL (ref 70–99)
Potassium: 4.1 mEq/L (ref 3.5–5.1)
Sodium: 135 mEq/L (ref 135–145)
Total Bilirubin: 0.7 mg/dL (ref 0.2–1.2)
Total Protein: 7.6 g/dL (ref 6.0–8.3)

## 2020-09-18 NOTE — Progress Notes (Signed)
Subjective:   PATIENT ID: Brent Holloway GENDER: male DOB: 1956-02-19, MRN: [id number]   HPI  Chief Complaint  Patient presents with  . Follow-up    9 month follow up.   Has some papers that he needs to be signed by JE.  Would like to have something for the cough.  He stated that when his oxygen levels get low he starts to cough.    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  Synopsis: He was incidentally diagnosed with pulmonary fibrosis in 01/2017 when he was diagnosed with DVT. He was evaluated for dyspnea in 01/2019 with evidence of progression of ILD. He started wearing oxygen in 05/2019 and started on Ofev 07/2019. He has been referred to [facility] and continues to follow-up in clinic to assess for candidacy for lung transplant.  Since our last visit, he reports that he is more active and walking at least 4 days a week. He has completed Pulmonary Rehab and has been advised to wear 15L O2 via [place] during activity. He is also participating in [employer]. [facility] on 06/29/20 by Dr. Dorthula Rue has recommended a target BMI 30-31 (<222 lbs) before a full evaluation. He reports that he coughs more when his oxygen gets low. His dyspnea has improved now that his O2 has been increased to 15L. He has been compliant with his Breo and Ofev. He has been using his CPAP but not consistently. Difficult to tolerate mask.  Social History: Previously smoked 4-5 cigars a day. Quit in 2018.  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  Past Medical History:  Diagnosis Date  . DVT (deep venous thrombosis) ([place])   .  Factor 5 Leiden mutation, heterozygous ([place])      Family History  Problem Relation Age of Onset  . Heart disease Father   . Congestive Heart Failure Father   . Dementia Mother      Social History   Occupational History  . Not on file  Tobacco Use  . Smoking status: Former Smoker    Packs/day: 0.25    Years: 44.00    Pack years: 11.00    Types: Cigars    Start date: 17    Quit date: 03/29/2017    Years since quitting: 3.4  . Smokeless tobacco: Never Used  . Tobacco comment: 5-6 cigars daily   Substance and Sexual Activity  . Alcohol use: Yes    Comment: occ  . Drug use: No  . Sexual activity: Not on file    No Known Allergies   Outpatient Medications Prior to Visit  Medication Sig Dispense Refill  . BREO ELLIPTA 200-25 MCG/INH AEPB USE 1 INHALATION BY MOUTH  DAILY 180 each 3  . Multiple Vitamins-Minerals (MULTIVITAMIN WITH MINERALS) tablet Take 1 tablet by mouth daily.    Marland Kitchen OFEV 150 MG CAPS TAKE 1 CAPSULE BY MOUTH  TWICE A DAY 12 HOURS APART  WITH FOOD 60 capsule 12  . Omega-3 Fatty Acids (FISH OIL) 1000 MG CAPS Take 1 capsule by mouth daily.    Marland Kitchen omeprazole (PRILOSEC) 20 MG capsule TAKE 1 CAPSULE BY MOUTH  DAILY 90 capsule 3  . XARELTO 20 MG TABS  tablet Take 20 mg by mouth daily.  2   No facility-administered medications prior to visit.    Review of Systems  Constitutional: Negative for chills, diaphoresis, fever, malaise/fatigue and weight loss.  HENT: Negative for congestion.   Respiratory: Positive for cough and shortness of breath. Negative for hemoptysis, sputum production and wheezing.   Cardiovascular: Negative for chest pain, palpitations and leg swelling.    Objective:   Vitals:   09/18/20 1423  BP: 108/68  Pulse: 73  Temp: 97.7 F (36.5 C)  TempSrc: Tympanic  SpO2: 93%  Weight: 244 lb 2 oz (110.7 kg)  Height: 5' 11"  (1.803 m)   SpO2: 93 % (room air)  Physical Exam: General: Well-appearing, no acute distress HENT: [place], AT Eyes: EOMI, no  scleral icterus Respiratory: Bibasilar crackles Cardiovascular: RRR, -M/R/G, no JVD Extremities:-Edema,-tenderness Neuro: AAO x4, CNII-XII grossly intact Skin: Intact, no rashes or bruising Psych: Normal mood, normal affect   Data Reviewed:  Imaging: CT chest 07/08/2017 - Upper lobe predominant L>R subpleural reticulation with cystic-appearing lesions that may represent early honeycombing CT Chest 05/09/19 - Progressive subpleural reticulation associated with GGO with diffuse involvement predominantly worse in the basilar regions with honeycombing and traction bronchiectasis. RUL nodule also noted to be increased 56m>>35mm compared to 07/09/19 imaging. PET/CT 08/09/19 - Progressive subpleural reticulation with GGO with diffuse involvement with honeycombing and traction bronchiectasis. Unchange 8-9235mright upper lobe lung nodule with low-level hypermetabolic activity. Activity level is similar to what is found diffusely throughout lung parenchyma CT Chest 01/09/20 - Solid 35m18mUL, stable since 05/09/2019. Progression of fibrosis consistent with UIP  PFT: 04/15/19 FVC 3.29 (67%) FEV1 2.93 (80%) Ratio 87  TLC 61% DLCO 50% Interpretation: Mild restrictive defect with moderately reduced DLCO  Labs: ANA - neg HIV ab - nonreactive Anti-scleroderma ab - <0.2 Anti-ds DNA ab - 1 Aldolase - 7 CK - 129 ANCA screen - neg Anti-ribonucleic acid ab - 11.2 (neg) RF 13.1 SSA ab 0.5 SSB ab 0.2 CCP ab 6  Ambulatory O2 07/05/19 Patient Saturations on Room Air at Rest = 98% Patient Saturations on Room Air while Ambulating = 86% Patient Saturations on 3 Liters of oxygen while Ambulating = 92% CONCLUSION: Recommend 3L O2 with exertion and sleep  Ambulatory O2 08/11/19 Patient Saturations on Room Air at Rest = 91% Patient Saturations on Room Air while Ambulating = 83% Patient Saturations on 10 Liters of oxygen while Ambulating = 93%  Home sleep study 06/13/19 Severe sleep apnea AHI 34.1. Recommend  CPAP sleep titration study  6MWT 06/13/19 - 659 feet. Required 4L  CPAP Compliance Report 09/17/19-10/16/19 Usage days 19/30 days 63% >4hours 43% AHI on CPAP 0.0  Imaging, labs and test noted above have been reviewed independently by me.  Assessment & Plan:   Discussion: [age]ar old male with hx RLE DVT secondary to heterozygous Factor V Leiden who presents for IPF follow-up. Autoimmune work-up negative. 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 [facility]. He is compliant with oxygen, bronchodilators, Ofev and Pulmonary rehab. Increased O2 requirement. Completed handicap and Ofev assistance paperwork  Interstitial Lung Disease secondary to IPF based on HRCT Suspect occupational related ILD --Completed Pulmonary Rehab --Continue follow-up with [facility] for Lung Transplant: Continue working on weight loss. Goal 222lbs --Continue Ofev. Will need CBC/CMP q3 months. Reviewed labs from 06/29/20. --Continue omeprazole 20 mg daily --Continue Breo 200-25 mcg ONE puff daily  Chronic hypoxemic respiratory failure  secondary to interstitial lung disease and OSA --Continue supplemental oxygen with 15L on exertion and sleep  OSA on CPAP --Counseled on sleep hygiene --Counseled on weight loss/maintenance of healthy weight --Counseled to NOT drive if/when sleepy --Advised patient to wear CPAP for at least four hours each night for greater than 70% of the time to avoid the machine being repossessed by insurance.  Right upper lobe pulmonary nodule Mediastinal and hilar lymphadenopathy --Stable nodule since 04/2019. Prior PET in 07/2019 with nodule SUV 2.1 similar to parenchyma.  --Consider CT Chest in 12/2020 pending evaluation with [facility] Maintenance Immunization History  Administered Date(s) Administered  . Influenza Inj Mdck Quad Pf 03/23/2019  . Influenza,inj,Quad PF,6-35 Mos 04/12/2020   . Influenza-Unspecified 05/12/2017, 03/15/2019, 03/23/2019, 04/09/2019  . PFIZER(Purple Top)SARS-COV-2 Vaccination 10/12/2019, 11/02/2019, 04/22/2020  . Pneumococcal Polysaccharide-23 09/23/2019  . Tdap 05/17/2020  . Zoster 06/12/2020   Orders Placed This Encounter  Procedures  . CBC w/Diff    Standing Status:   Future    Number of Occurrences:   1    Standing Expiration Date:   09/18/2021  . Comp Met (CMET)    Standing Status:   Future    Number of Occurrences:   1    Standing Expiration Date:   09/18/2021   No orders of the defined types were placed in this encounter.   Return in about 3 months (around 12/19/2020).   I have spent a total time of 35-minutes on the day of the appointment reviewing prior documentation, coordinating care and discussing medical diagnosis and plan with the patient/family. Imaging, labs and tests included in this note have been reviewed and interpreted independently by me.   [place], MD [place] [facility] 09/18/2020 2:32 PM  Office Number [phone]

## 2020-09-18 NOTE — Patient Instructions (Signed)
Completed handicap and Ofev assistance paperwork  Interstitial Lung Disease secondary to IPF based on HRCT Suspect occupational related ILD --Completed Pulmonary Rehab --Continue follow-up with [facility] for [facility] Transplant: Continue working on weight loss. Goal 222lbs --Continue Ofev. Will need CBC/CMP q3 months. Reviewed labs from 06/29/20. --Continue omeprazole 20 mg daily --Continue Breo 200-25 mcg ONE puff daily  Chronic hypoxemic respiratory failure secondary to interstitial lung disease and OSA --Continue supplemental oxygen with 15L on exertion and sleep  OSA on CPAP --Counseled on sleep hygiene --Counseled on weight loss/maintenance of healthy weight --Counseled to NOT drive if/when sleepy --Advised patient to wear CPAP for at least four hours each night for greater than 70% of the time to avoid the machine being repossessed by insurance.  Right upper lobe pulmonary nodule Mediastinal and hilar lymphadenopathy --Stable nodule since 04/2019. Prior PET in 07/2019 with nodule SUV 2.1 similar to parenchyma.  --Consider CT Chest in 12/2020 pending evaluation with [facility]  Follow-up with me in 3 months

## 2020-09-19 ENCOUNTER — Other Ambulatory Visit: Payer: Self-pay | Admitting: Pulmonary Disease

## 2020-09-19 DIAGNOSIS — J849 Interstitial pulmonary disease, unspecified: Secondary | ICD-10-CM

## 2020-09-20 ENCOUNTER — Other Ambulatory Visit: Payer: Self-pay | Admitting: Pulmonary Disease

## 2020-09-20 ENCOUNTER — Encounter: Payer: Self-pay | Admitting: Pulmonary Disease

## 2020-09-20 DIAGNOSIS — J84112 Idiopathic pulmonary fibrosis: Secondary | ICD-10-CM

## 2020-09-20 DIAGNOSIS — Z77090 Contact with and (suspected) exposure to asbestos: Secondary | ICD-10-CM | POA: Insufficient documentation

## 2020-10-17 ENCOUNTER — Other Ambulatory Visit: Payer: Self-pay | Admitting: Pulmonary Disease

## 2020-10-17 DIAGNOSIS — J84112 Idiopathic pulmonary fibrosis: Secondary | ICD-10-CM

## 2020-11-22 ENCOUNTER — Telehealth: Payer: Self-pay

## 2020-11-22 ENCOUNTER — Other Ambulatory Visit (HOSPITAL_COMMUNITY): Payer: Self-pay

## 2020-11-22 NOTE — Telephone Encounter (Addendum)
Received notification from [facility] regarding a prior authorization for OFEV. Authorization has been APPROVED from 11/22/2020 to 07/20/2021.   Key: [id number] - PA Case ID: [id number]  Important to note that patient is known by "Brent Holloway" with the insurance company--Brent Holloway does NOT work.  Insurance covers [phone] leaving patient with a copay of $3,002.21. Since pt is now on a Part D plan, will look into applying for a Patient Assistance Program.

## 2020-11-22 NOTE — Telephone Encounter (Deleted)
pa

## 2020-11-22 NOTE — Telephone Encounter (Signed)
Received notification that patient has recently obtained new insurance information, we will begin benefits investigation and resubmit PA for Ofev if needed.

## 2020-11-23 ENCOUNTER — Other Ambulatory Visit: Payer: Self-pay

## 2020-11-23 DIAGNOSIS — J84112 Idiopathic pulmonary fibrosis: Secondary | ICD-10-CM

## 2020-11-23 MED ORDER — OFEV 150 MG PO CAPS: ORAL_CAPSULE | ORAL | 10 refills | Status: DC

## 2020-11-23 NOTE — Telephone Encounter (Signed)
Called and spoke to pt regarding PAP paper work being mailed out to him, and what we would need to have him fill out and send back with the forms. He informs me that [facility] had supposedly also reached out to his wife and was also working on some potential PAP. But he assured me that they would still send back the info to [place] as well. As they will be out of town this weekend, I would expect the information by middle of next week at the earliest.

## 2020-11-23 NOTE — Telephone Encounter (Signed)
Patient assistance paperwork received. Prescription printed and signed. Paperwork given back to pharmacy.

## 2020-11-27 ENCOUNTER — Other Ambulatory Visit: Payer: Self-pay | Admitting: Gastroenterology

## 2020-11-27 DIAGNOSIS — Z1211 Encounter for screening for malignant neoplasm of colon: Secondary | ICD-10-CM

## 2020-11-30 NOTE — Telephone Encounter (Signed)
We have received PAP forms and will retain as a backup in case current PAP runs out.

## 2020-12-04 ENCOUNTER — Telehealth: Payer: Self-pay | Admitting: Pulmonary Disease

## 2020-12-04 NOTE — Telephone Encounter (Signed)
Will route to Lauren who is taking care of Dr. Everardo All box. Dr. Everardo All is in the office on Friday 12/04/2020. tha  Lauren: I think Carollee Herter is working with her on Friday. Thanks!

## 2020-12-07 NOTE — Telephone Encounter (Signed)
I did not see this in Dr George Hugh paperwork in B pod. Lauren, do you have this or know the status of forms? Thanks!

## 2020-12-12 NOTE — Telephone Encounter (Signed)
Will forward to Lauren to advise on update.

## 2020-12-12 NOTE — Telephone Encounter (Signed)
I have not seen any of this paperwork.  Will route to [place] since she was working with JE that day and JE to see if there is any follow up

## 2020-12-13 NOTE — Telephone Encounter (Signed)
Paperwork has been completed. Patient and/or his wife will pick up packet tomorrow. Who can I leave this with at the front desk?  Also, patient needs follow-up scheduled with me in June  Mechele Collin, M.D. [facility] Pulmonary/Critical Care Medicine 12/13/2020 10:24 AM

## 2020-12-18 NOTE — Telephone Encounter (Signed)
Called spoke with patient.  He did come pick up paperwork and is scheduled with JE on 01/15/21 at1115.  Nothing further needed at this time.

## 2021-01-08 ENCOUNTER — Ambulatory Visit
Admission: RE | Admit: 2021-01-08 | Discharge: 2021-01-08 | Disposition: A | Discharge status: Home / Self Care | Payer: Medicare Other | Source: Ambulatory Visit | Attending: Gastroenterology | Admitting: Gastroenterology

## 2021-01-08 DIAGNOSIS — Z1211 Encounter for screening for malignant neoplasm of colon: Secondary | ICD-10-CM

## 2021-01-15 ENCOUNTER — Encounter: Payer: Self-pay | Admitting: Pulmonary Disease

## 2021-01-15 ENCOUNTER — Ambulatory Visit (INDEPENDENT_AMBULATORY_CARE_PROVIDER_SITE_OTHER): Payer: Medicare Other | Admitting: Pulmonary Disease

## 2021-01-15 ENCOUNTER — Other Ambulatory Visit: Payer: Self-pay

## 2021-01-15 VITALS — BP 112/62 | HR 77 | Ht 71.0 in | Wt 226.0 lb

## 2021-01-15 DIAGNOSIS — J849 Interstitial pulmonary disease, unspecified: Secondary | ICD-10-CM | POA: Diagnosis not present

## 2021-01-15 DIAGNOSIS — R591 Generalized enlarged lymph nodes: Secondary | ICD-10-CM

## 2021-01-15 DIAGNOSIS — J9611 Chronic respiratory failure with hypoxia: Secondary | ICD-10-CM | POA: Diagnosis not present

## 2021-01-15 DIAGNOSIS — J84112 Idiopathic pulmonary fibrosis: Secondary | ICD-10-CM | POA: Diagnosis not present

## 2021-01-15 DIAGNOSIS — G4733 Obstructive sleep apnea (adult) (pediatric): Secondary | ICD-10-CM | POA: Diagnosis not present

## 2021-01-15 LAB — CBC WITH DIFFERENTIAL/PLATELET
Basophils Absolute: 0.1 10*3/uL (ref 0.0–0.1)
Basophils Relative: 0.7 % (ref 0.0–3.0)
Eosinophils Absolute: 0.1 10*3/uL (ref 0.0–0.7)
Eosinophils Relative: 1.5 % (ref 0.0–5.0)
HCT: 40.2 % (ref 39.0–52.0)
Hemoglobin: 14 g/dL (ref 13.0–17.0)
Lymphocytes Relative: 9.8 % — ABNORMAL LOW (ref 12.0–46.0)
Lymphs Abs: 0.9 10*3/uL (ref 0.7–4.0)
MCHC: 34.9 g/dL (ref 30.0–36.0)
MCV: 86.4 fl (ref 78.0–100.0)
Monocytes Absolute: 0.9 10*3/uL (ref 0.1–1.0)
Monocytes Relative: 10.2 % (ref 3.0–12.0)
Neutro Abs: 7.1 10*3/uL (ref 1.4–7.7)
Neutrophils Relative %: 77.8 % — ABNORMAL HIGH (ref 43.0–77.0)
Platelets: 228 10*3/uL (ref 150.0–400.0)
RBC: 4.65 Mil/uL (ref 4.22–5.81)
RDW: 14.1 % (ref 11.5–15.5)
WBC: 9.1 10*3/uL (ref 4.0–10.5)

## 2021-01-15 LAB — COMPREHENSIVE METABOLIC PANEL
ALT: 15 U/L (ref 0–53)
AST: 17 U/L (ref 0–37)
Albumin: 4.5 g/dL (ref 3.5–5.2)
Alkaline Phosphatase: 51 U/L (ref 39–117)
BUN: 28 mg/dL — ABNORMAL HIGH (ref 6–23)
CO2: 25 mEq/L (ref 19–32)
Calcium: 9.5 mg/dL (ref 8.4–10.5)
Chloride: 103 mEq/L (ref 96–112)
Creatinine, Ser: 1.33 mg/dL (ref 0.40–1.50)
GFR: 56.17 mL/min — ABNORMAL LOW (ref >60.00)
Glucose, Bld: 90 mg/dL (ref 70–99)
Potassium: 5 mEq/L (ref 3.5–5.1)
Sodium: 135 mEq/L (ref 135–145)
Total Bilirubin: 0.6 mg/dL (ref 0.2–1.2)
Total Protein: 7.5 g/dL (ref 6.0–8.3)

## 2021-01-15 NOTE — Progress Notes (Signed)
Subjective:   PATIENT ID: [name] GENDER: male DOB: 04-01-1956, MRN: [id number]   HPI  Chief Complaint  Patient presents with   Sleep Apnea    Connects oxygen to device   Reason for Visit: Follow-up for IPF  Mr. 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  Synopsis: He was incidentally diagnosed with pulmonary fibrosis in 01/2017 when he was diagnosed with DVT. He was evaluated for dyspnea in 01/2019 with evidence of progression of ILD. He started wearing oxygen in 05/2019 and started on Ofev 07/2019. He has been referred to [facility] to assess for candidacy for lung transplant. He was recently declined due to not meeting weight requirement.  Since our last visit, he reports intentional weight loss from 244 lbs to 226 lbs. Losing weight has helped with his breathing. He has been compliant with his oxygen with 15L on exertion. He is walking 4 days a week at least 1/4 mile. He has shortness of breath with exertion which is expected. Occasional cough. No wheezing. He is compliant Breo and Ofev. No side effects from either medications. Has been using CPAP but still has difficulty tolerating mask. He wears as often as he can tolerate and attempts to wear >4 hours nightly.  Social History: Previously smoked 4-5 cigars a day. Quit in 2018.  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  Past Medical History:  Diagnosis Date   DVT (deep venous thrombosis) ([facility])    Factor 5 Leiden mutation, heterozygous ([place])      Family History  Problem Relation Age of Onset   Heart disease Father    Congestive Heart Failure Father    Dementia Mother      Social  History   Occupational History   Not on file  Tobacco Use   Smoking status: Former    Packs/day: 0.25    Years: 44.00    Pack years: 11.00    Types: Cigars, Cigarettes    Start date: 59    Quit date: 03/29/2017    Years since quitting: 3.8   Smokeless tobacco: Never   Tobacco comments:    5-6 cigars daily   Substance and Sexual Activity   Alcohol use: Yes    Comment: occ   Drug use: No   Sexual activity: Not on file    No Known Allergies   Outpatient Medications Prior to Visit  Medication Sig Dispense Refill   BREO ELLIPTA 200-25 MCG/INH AEPB USE 1 INHALATION BY MOUTH  DAILY 180 each 3   Multiple Vitamins-Minerals (MULTIVITAMIN WITH MINERALS) tablet Take 1 tablet by mouth daily.     Nintedanib (OFEV) 150 MG CAPS TAKE 1 CAPSULE BY MOUTH  TWICE A DAY 12 HOURS APART  WITH FOOD 60 capsule 10   Omega-3 Fatty Acids (FISH OIL) 1000 MG CAPS Take 1 capsule by mouth daily.     omeprazole (PRILOSEC) 20 MG capsule TAKE 1 CAPSULE BY MOUTH  DAILY 90 capsule 3   XARELTO 20 MG TABS tablet Take 20 mg by mouth daily.  2   No facility-administered medications prior to visit.    Review of Systems  Constitutional:  Negative for chills, diaphoresis, fever, malaise/fatigue and weight loss.  HENT:  Negative for congestion.  Respiratory:  Positive for cough and shortness of breath. Negative for hemoptysis, sputum production and wheezing.   Cardiovascular:  Negative for chest pain, palpitations and leg swelling.   Objective:   Vitals:   01/15/21 1119  BP: 112/62  Pulse: 77  SpO2: 100%  Weight: 226 lb (102.5 kg)  Height: _0  (1.803 m)   SpO2: 100 % (5L)  Physical Exam: General: Well-appearing, no acute distress HENT: [place], AT Eyes: EOMI, no scleral icterus Respiratory: Bibasilar crackles Cardiovascular: RRR, -M/R/G, no JVD Extremities:-Edema,-tenderness Neuro: AAO x4, CNII-XII grossly intact Skin: Intact, no rashes or bruising Psych: Normal mood, normal affect  Data  Reviewed:  Imaging: CT chest 07/08/2017 - Upper lobe predominant L>R subpleural reticulation with cystic-appearing lesions that may represent early honeycombing CT Chest 05/09/19 - Progressive subpleural reticulation associated with GGO with diffuse involvement predominantly worse in the basilar regions with honeycombing and traction bronchiectasis. RUL nodule also noted to be increased 83m>>41mm compared to 07/09/19 imaging. PET/CT 08/09/19 - Progressive subpleural reticulation with GGO with diffuse involvement with honeycombing and traction bronchiectasis. Unchange 8-915mright upper lobe lung nodule with low-level hypermetabolic activity. Activity level is similar to what is found diffusely throughout lung parenchyma CT Chest 01/09/20 - Solid 41m441mUL, stable since 05/09/2019. Progression of fibrosis consistent with UIP  PFT: 04/15/19 FVC 3.29 (67%) FEV1 2.93 (80%) Ratio 87  TLC 61% DLCO 50% Interpretation: Mild restrictive defect with moderately reduced DLCO  Labs: ANA - neg HIV ab - nonreactive Anti-scleroderma ab - <0.2 Anti-ds DNA ab - 1 Aldolase - 7 CK - 129 ANCA screen - neg Anti-ribonucleic acid ab - 11.2 (neg) RF 13.1 SSA ab 0.5 SSB ab 0.2 CCP ab 6  Ambulatory O2 07/05/19 Patient Saturations on Room Air at Rest = 98% Patient Saturations on Room Air while Ambulating = 86% Patient Saturations on 3 Liters of oxygen while Ambulating = 92% CONCLUSION: Recommend 3L O2 with exertion and sleep  Ambulatory O2 08/11/19 Patient Saturations on Room Air at Rest = 91% Patient Saturations on Room Air while Ambulating = 83% Patient Saturations on 10 Liters of oxygen while Ambulating = 93%  Home sleep study 06/13/19 Severe sleep apnea AHI 34.1. Recommend CPAP sleep titration study  6MWT 06/13/19 - 659 feet. Required 4L  CPAP Compliance Report 09/17/19-10/16/19 Usage days 19/30 days 63% >4hours 43% AHI on CPAP 0.0  CPAP Compliance Report 12/15/20-01/13/21 Usage days 14/30 (47%) >4  hours 12 days (40%) <4 hours 2 days (7%)  Labs CBC    Component Value Date/Time   WBC 9.9 09/18/2020 1514   RBC 4.50 09/18/2020 1514   HGB 13.6 09/18/2020 1514   HGB 14.0 07/06/2017 0845   HCT 39.2 09/18/2020 1514   HCT 40.3 07/06/2017 0845   PLT 226.0 09/18/2020 1514   PLT 193 07/06/2017 0845   MCV 87.0 09/18/2020 1514   MCV 83.1 07/06/2017 0845   MCH 28.9 07/06/2017 0845   MCH 29.4 12/26/2016 2202   MCHC 34.8 09/18/2020 1514   RDW 14.1 09/18/2020 1514   RDW 13.9 07/06/2017 0845   LYMPHSABS 1.1 09/18/2020 1514   LYMPHSABS 1.1 07/06/2017 0845   MONOABS 0.9 09/18/2020 1514   MONOABS 0.8 07/06/2017 0845   EOSABS 0.2 09/18/2020 1514   EOSABS 0.2 07/06/2017 0845   BASOSABS 0.1 09/18/2020 1514   BASOSABS 0.1 07/06/2017 0845   CBC    Component Value Date/Time   WBC 9.9 09/18/2020 1514   RBC 4.50 09/18/2020 1514   HGB 13.6 09/18/2020 1514  HGB 14.0 07/06/2017 0845   HCT 39.2 09/18/2020 1514   HCT 40.3 07/06/2017 0845   PLT 226.0 09/18/2020 1514   PLT 193 07/06/2017 0845   MCV 87.0 09/18/2020 1514   MCV 83.1 07/06/2017 0845   MCH 28.9 07/06/2017 0845   MCH 29.4 12/26/2016 2202   MCHC 34.8 09/18/2020 1514   RDW 14.1 09/18/2020 1514   RDW 13.9 07/06/2017 0845   LYMPHSABS 1.1 09/18/2020 1514   LYMPHSABS 1.1 07/06/2017 0845   MONOABS 0.9 09/18/2020 1514   MONOABS 0.8 07/06/2017 0845   EOSABS 0.2 09/18/2020 1514   EOSABS 0.2 07/06/2017 0845   BASOSABS 0.1 09/18/2020 1514   BASOSABS 0.1 07/06/2017 0845   Imaging, labs and test noted above have been reviewed independently by me.  Assessment & Plan:   Discussion: 65 year old male with hx RLE DVT secondary to heterozygous Factor V Leiden who presents for IPF follow-up. Autoimmune work-up negative. 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 Currently being [facility] at [facility] for [facility]. He is compliant with oxygen, bronchodilators and Ofev. He has completed  Pulmonary rehab.    Interstitial Lung Disease secondary to IPF based on HRCT Suspect occupational related ILD --Completed Pulmonary Rehab --We will contact [facility] for re-referral for evaluation as he is nearing weight loss goal. Goal 215-222lbs. Today 226 lbs --Continue Ofev. Will need CBC/CMET q3 months. Reviewed labs from 09/18/20 --Continue omeprazole 20 mg daily --Continue Breo 200-25 mcg ONE puff daily  Chronic hypoxemic respiratory failure secondary to interstitial lung disease and OSA --Continue supplemental oxygen with 15L on exertion and sleep  OSA on CPAP --Reviewed CPAP compliance report --Counseled on sleep hygiene --Counseled on weight loss/maintenance of healthy weight --Counseled to NOT drive if/when sleepy --Advised patient to wear CPAP for at least four hours each night for greater than 70% of the time to avoid the machine being repossessed by insurance.  Right upper lobe pulmonary nodule Mediastinal and hilar lymphadenopathy --Reviewed 12/2019: Stable nodule since 04/2019. Prior PET in 07/2019 with nodule SUV 2.1 similar to parenchyma.  --Postpone CT Chest in 06/2021 pending evaluation with [facility] Maintenance Immunization History  Administered Date(s) Administered   Hepatitis B, adult 11/22/2020, 12/24/2020   Influenza Inj Mdck Quad Pf 03/23/2019   Influenza,inj,Quad PF,6+ Mos 05/12/2017   Influenza,inj,Quad PF,6-35 Mos 04/12/2020   Influenza-Unspecified 05/12/2017, 03/15/2019, 03/23/2019, 04/09/2019   PFIZER(Purple Top)SARS-COV-2 Vaccination 10/12/2019, 11/02/2019, 04/22/2020, 11/22/2020   Pneumococcal Polysaccharide-23 09/23/2019, 05/17/2020   Tdap 05/17/2020   Zoster, Live 06/12/2020, 11/06/2020   Orders Placed This Encounter  Procedures   CBC w/Diff    Standing Status:   Future    Number of Occurrences:   1    Standing Expiration Date:   01/15/2022   Comp Met (CMET)    Standing Status:   Future    Number of Occurrences:   1    Standing Expiration  Date:   01/15/2022   Ambulatory referral to Pulmonology    Referral Priority:   Routine    Referral Type:   Consultation    Referral Reason:   Specialty Services Required    Requested Specialty:   Pulmonary Disease    Number of Visits Requested:   1   No orders of the defined types were placed in this encounter.   Return in about 3 months (around 04/17/2021).   I have spent a total time of 35-minutes on the day of the appointment reviewing prior documentation, coordinating  care and discussing medical diagnosis and plan with the patient/family. Imaging, labs and tests included in this note have been reviewed and interpreted independently by me.  [place], MD [place] [facility] 01/15/2021 11:36 AM  Office Number [phone]

## 2021-01-15 NOTE — Patient Instructions (Signed)
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 Goal 222lbs. Today 226 lbs --Continue Ofev. Will need CBC/CMET q3 months. Reviewed labs from 09/18/20 --Continue omeprazole 20 mg daily --Continue Breo 200-25 mcg ONE puff daily  Chronic hypoxemic respiratory failure secondary to interstitial lung disease and OSA --Continue supplemental oxygen with 15L on exertion and sleep  OSA on CPAP --Counseled on sleep hygiene --Counseled on weight loss/maintenance of healthy weight --Counseled to NOT drive if/when sleepy --Advised patient to wear CPAP for at least four hours each night for greater than 70% of the time to avoid the machine being repossessed by insurance.  Right upper lobe pulmonary nodule Mediastinal and hilar lymphadenopathy --Reviewed 12/2019: Stable nodule since 04/2019. Prior PET in 07/2019 with nodule SUV 2.1 similar to parenchyma.  --Postpone CT Chest in 06/2021 pending evaluation with [facility]  Follow-up with me in 3 months

## 2021-02-05 ENCOUNTER — Other Ambulatory Visit: Payer: Self-pay | Admitting: Pulmonary Disease

## 2021-02-27 ENCOUNTER — Telehealth: Payer: Self-pay | Admitting: Pulmonary Disease

## 2021-02-27 MED ORDER — MOLNUPIRAVIR EUA 200MG CAPSULE: 4.0000 | ORAL_CAPSULE | Freq: Two times a day (BID) | ORAL | 0 refills | Status: AC

## 2021-02-27 NOTE — Telephone Encounter (Signed)
Patient currently taking Xarelto. Increased bleeding risk with Paxlovid and FDA labeling information recommends avoiding concomitant use.  Could consider other oral option, molnupiravir with minimal drug interactions, but also has less efficacy than Paxlovid.  Alternatively IV treatment is possibility to avoid interaction with Xarelto

## 2021-02-27 NOTE — Telephone Encounter (Signed)
Spoke with the pt (sees Dr Everardo All for ILD and chronic respiratory failure) He states exposed to person pos for covid approx 1 wk ago  He started having runny nose last night and felt lightheaded- tested with at home test today and it was pos  He still only has runny nose and feels slightly lightheaded at times  He has no fever or aches, increased SOB, cough, wheezing, chest tightness  He has had 4 covid vaccines  Asking if there is something we should call in for him  Please advise thanks  No Known Allergies

## 2021-02-27 NOTE — Telephone Encounter (Signed)
Forwarding to [facility]

## 2021-02-27 NOTE — Telephone Encounter (Signed)
Please send in prescription for molnupiravir 800mg  q12hr for 5 days.  Thanks, 

## 2021-02-27 NOTE — Telephone Encounter (Signed)
Routing to pharm to check and see if paxlovid ok, thanks

## 2021-02-27 NOTE — Telephone Encounter (Signed)
Called and spoke with Patient.  Dr. Lanora Manis recommendations given.  Patient requested molnupiravir to be sent to [facility] [address]. Nothing further at this time.

## 2021-02-27 NOTE — Telephone Encounter (Signed)
Please send in paxlovid prescription if no contraindications with his other medications.  Thanks, Cletis Athens

## 2021-04-23 ENCOUNTER — Telehealth: Payer: Self-pay | Admitting: Pulmonary Disease

## 2021-04-23 DIAGNOSIS — J849 Interstitial pulmonary disease, unspecified: Secondary | ICD-10-CM

## 2021-04-23 NOTE — Telephone Encounter (Signed)
Ok for referral for pulmonary rehab

## 2021-04-23 NOTE — Telephone Encounter (Signed)
JE please advise if ok to send in the order for the pt to be set up with Pulmonary rehab.  Thanks

## 2021-04-24 ENCOUNTER — Encounter (HOSPITAL_COMMUNITY): Payer: Self-pay | Admitting: *Deleted

## 2021-04-24 NOTE — Progress Notes (Signed)
Received referral from Dr. Everardo All for this pt to participate in pulmonary rehab with the diagnosis of ILD.  Pt is well known to pulmonary rehab staff from previous participation in 2021. Clinical review of pt follow up appt on 6/28 Pulmonary office note.  Also reviewed medical history in CareEverywhere.  Follow up appt at [facility] for lung transplant evaluation.  Pt with Covid Risk Score - 4. Pt appropriate for scheduling for Pulmonary rehab.  Will forward to support staff for scheduling and verification of insurance eligibility/benefits with pt consent. Alanson Aly, BSN Cardiac and Emergency planning/management officer

## 2021-04-24 NOTE — Telephone Encounter (Signed)
Called and spoke with patient. He verbalized understanding. He is aware that the order has been placed.   Nothing further needed at time of call.

## 2021-05-01 ENCOUNTER — Telehealth: Payer: Self-pay | Admitting: Pulmonary Disease

## 2021-05-01 NOTE — Telephone Encounter (Signed)
Call made to patient, confirmed DOB. Patient stating [facility] is wanting patient to get his Flu shot and Prevnar 13.   Je please advise can we place patient on schedule to get both flu and prevnar 13. Thanks. Aware you return next week.

## 2021-05-03 NOTE — Telephone Encounter (Signed)
Luciano Cutter, MD  You 2 hours ago (10:40 AM)   Yes, that is appropriate     Spoke with the pt's spouse and notified of response. She wants to check with [facility] about the prevnar 20. She will call back to make appt. Nothing further needed.

## 2021-05-03 NOTE — Telephone Encounter (Signed)
We no longer have the prevnar 13, only the prevnar 20.  Is this still okay? Thanks.

## 2021-05-03 NOTE — Telephone Encounter (Signed)
Ok to schedule for vaccinations: influenza and prevnar 13

## 2021-05-09 ENCOUNTER — Telehealth (HOSPITAL_COMMUNITY): Payer: Self-pay

## 2021-05-09 NOTE — Telephone Encounter (Signed)
Pt insurance is active and benefits verified through Medicare a/b Co-pay 0, DED $233/$233 met, out of pocket 0/0 met, co-insurance 20%. no pre-authorization required   2ndary insurance is active and benefits verified through [employer]. Co-pay 0, DED 0/0 met, out of pocket 0/0 met, co-insurance 0%. No pre-authorization required.

## 2021-05-16 ENCOUNTER — Telehealth (HOSPITAL_COMMUNITY): Payer: Self-pay | Admitting: *Deleted

## 2021-05-20 ENCOUNTER — Other Ambulatory Visit: Payer: Self-pay

## 2021-05-20 ENCOUNTER — Encounter (HOSPITAL_COMMUNITY)
Admission: RE | Admit: 2021-05-20 | Discharge: 2021-05-20 | Disposition: A | Discharge status: Home / Self Care | Payer: Medicare Other | Source: Ambulatory Visit | Attending: Pulmonary Disease | Admitting: Pulmonary Disease

## 2021-05-20 ENCOUNTER — Encounter (HOSPITAL_COMMUNITY): Payer: Self-pay

## 2021-05-20 VITALS — BP 112/68 | HR 81 | Ht 71.0 in | Wt 219.4 lb

## 2021-05-20 DIAGNOSIS — J849 Interstitial pulmonary disease, unspecified: Secondary | ICD-10-CM | POA: Diagnosis not present

## 2021-05-20 HISTORY — DX: Interstitial pulmonary disease, unspecified: J84.9

## 2021-05-20 NOTE — Progress Notes (Signed)
Brent Holloway 65 y.o. male Pulmonary Rehab Orientation Note This patient who was referred to Pulmonary Rehab by Dr. Everardo All with the diagnosis of interstitial lung disease arrived today in Cardiac and Pulmonary Rehab. He arrived  ambulatory normal gait. Brent Holloway has been evaluated @ [facility] for lung transplant and was told to come back in 3 months to see if he is ready to be listed. He does carry portable oxygen. [employer] is the provider for their DME. Per pt, Brent Holloway uses oxygen intermittently. He requires 15 L/min when exercising, 6 L/min when performing light activity, and no oxygen when at rest. Color good, skin warm and dry. Patient is oriented to time and place. Patient's medical history, psychosocial health, and medications reviewed. Psychosocial assessment reveals pt lives with spouse. Brent Holloway is currently retired. Pt hobbies include raising and showing french bulldogs and boxers. Pt reports his stress level is none and that he is very "laid back". Pt does not exhibit signs of depression. PHQ2/9 score 0/0. Brent Holloway shows good  coping skills with positive outlook . Offered emotional support and reassurance. Will continue to monitor and evaluate progress toward psychosocial goal(s) of continued mental wellbeing while participating in pulmonary rehab. Physical assessment reveals heart rate is normal, breath sounds clear to auscultation, no wheezes, rales, or rhonchi. Grip strength equal, strong. Distal pulses present. Brent Holloway reports  he  does take medications as prescribed. Patient states he  follows a  Weight Watchers  diet. Brent Holloway has lost 50 pounds in the last 12-18 months by following the Weight Watchers guidelines. [facility] is happy with his current weight, they would like him in the 30-31 BMI range. Patient's weight will be monitored closely. Demonstration and practice of PLB using pulse oximeter. Brent Holloway able to return demonstration satisfactorily. Safety and hand hygiene in the exercise area reviewed with patient.  Brent Holloway voices understanding of the information reviewed. Department expectations discussed with patient and achievable goals were set. The patient shows enthusiasm about attending the program and we look forward to working with Brent Holloway completed a 6 min walk test today and is scheduled to begin exercise on Tuesday, 05/28/2021 in the 1:15 pm class.Marland Kitchen  [phone]

## 2021-05-20 NOTE — Progress Notes (Signed)
Pulmonary Individual Treatment Plan  Patient Details  Name: Brent Holloway MRN: [id number] Date of Birth: 1956/07/07 Referring Provider:   Doristine Devoid Pulmonary Rehab Walk Test from 05/20/2021 in [facility]  Referring Provider Everardo All       Initial Encounter Date:  Flowsheet Row Pulmonary Rehab Walk Test from 05/20/2021 in [facility] CARDIAC REHAB  Date 05/20/21       Visit Diagnosis: Interstitial lung disease (HCC)  Patient's Home Medications on Admission:   Current Outpatient Medications:    BREO ELLIPTA 200-25 MCG/INH AEPB, USE 1 INHALATION BY MOUTH  DAILY, Disp: 180 each, Rfl: 3   Multiple Vitamins-Minerals (MULTIVITAMIN WITH MINERALS) tablet, Take 1 tablet by mouth daily., Disp: , Rfl:    Nintedanib (OFEV) 150 MG CAPS, TAKE 1 CAPSULE BY MOUTH  TWICE A DAY 12 HOURS APART  WITH FOOD, Disp: 60 capsule, Rfl: 10   Omega-3 Fatty Acids (FISH OIL) 1000 MG CAPS, Take 1 capsule by mouth daily., Disp: , Rfl:    omeprazole (PRILOSEC) 20 MG capsule, TAKE 1 CAPSULE BY MOUTH  DAILY, Disp: 90 capsule, Rfl: 3   XARELTO 20 MG TABS tablet, Take 20 mg by mouth daily., Disp: , Rfl: 2  Past Medical History: Past Medical History:  Diagnosis Date   DVT (deep venous thrombosis) (HCC)    Factor 5 Leiden mutation, heterozygous (HCC)    Interstitial lung disease (HCC)     Tobacco Use: Social History   Tobacco Use  Smoking Status Former   Packs/day: 0.25   Years: 44.00   Pack years: 11.00   Types: Cigars, Cigarettes   Start date: 15   Quit date: 03/29/2017   Years since quitting: 4.1  Smokeless Tobacco Never  Tobacco Comments   5-6 cigars daily     Labs: Recent Review Flowsheet Data   There is no flowsheet data to display.     Capillary Blood Glucose: Lab Results  Component Value Date   GLUCAP 96 08/09/2019     Pulmonary Assessment Scores:  Pulmonary Assessment Scores     Row Name 05/20/21 0940         ADL UCSD   ADL  Phase Entry     SOB Score total 66       CAT Score   CAT Score 12       mMRC Score   mMRC Score 4             UCSD: Self-administered rating of dyspnea associated with activities of daily living (ADLs) 6-point scale (0 = "not at all" to 5 = "maximal or unable to do because of breathlessness")  Scoring Scores range from 0 to 120.  Minimally important difference is 5 units  CAT: CAT can identify the health impairment of COPD patients and is better correlated with disease progression.  CAT has a scoring range of zero to 40. The CAT score is classified into four groups of low (less than 10), medium (10 - 20), high (21-30) and very high (31-40) based on the impact level of disease on health status. A CAT score over 10 suggests significant symptoms.  A worsening CAT score could be explained by an exacerbation, poor medication adherence, poor inhaler technique, or progression of COPD or comorbid conditions.  CAT MCID is 2 points  mMRC: mMRC (Modified Medical Research Council) Dyspnea Scale is used to assess the degree of baseline functional disability in patients of respiratory disease due to dyspnea. No minimal important difference is  established. A decrease in score of 1 point or greater is considered a positive change.   Pulmonary Function Assessment:  Pulmonary Function Assessment - 05/20/21 0940       Breath   Bilateral Breath Sounds Clear    Shortness of Breath Yes;Limiting activity             Exercise Target Goals: Exercise Program Goal: Individual exercise prescription set using results from initial 6 min walk test and THRR while considering  patient's activity barriers and safety.   Exercise Prescription Goal: Initial exercise prescription builds to 30-45 minutes a day of aerobic activity, 2-3 days per week.  Home exercise guidelines will be given to patient during program as part of exercise prescription that the participant will acknowledge.  Activity Barriers &  Risk Stratification:  Activity Barriers & Cardiac Risk Stratification - 05/20/21 0936       Activity Barriers & Cardiac Risk Stratification   Activity Barriers Deconditioning;Muscular Weakness;Shortness of Breath             6 Minute Walk:  6 Minute Walk     Row Name 05/20/21 1144         6 Minute Walk   Phase Initial     Distance 1256 feet     Walk Time 6 minutes     # of Rest Breaks 0     MPH 2.38     METS 3.18     RPE 11     Perceived Dyspnea  1     VO2 Peak 11.14     Symptoms No     Resting HR 81 bpm     Resting BP 112/68     Resting Oxygen Saturation  100 %     Exercise Oxygen Saturation  during 6 min walk 82 %     Max Ex. HR 120 bpm     Max Ex. BP 134/68     2 Minute Post BP 116/68       Interval HR   1 Minute HR 101     2 Minute HR 116     3 Minute HR 120     4 Minute HR 119     5 Minute HR 116     6 Minute HR 113     2 Minute Post HR 79     Interval Heart Rate? Yes       Interval Oxygen   Interval Oxygen? Yes     Baseline Oxygen Saturation % 100 %     1 Minute Oxygen Saturation % 96 %     1 Minute Liters of Oxygen 6 L     2 Minute Oxygen Saturation % 85 %     2 Minute Liters of Oxygen 6 L     3 Minute Oxygen Saturation % 82 %     3 Minute Liters of Oxygen 8 L     4 Minute Oxygen Saturation % 84 %     4 Minute Liters of Oxygen 10 L     5 Minute Oxygen Saturation % 89 %     5 Minute Liters of Oxygen 15 L     6 Minute Oxygen Saturation % 90 %     6 Minute Liters of Oxygen 15 L     2 Minute Post Oxygen Saturation % 98 %     2 Minute Post Liters of Oxygen 6 L  Oxygen Initial Assessment:  Oxygen Initial Assessment - 05/20/21 0938       Home Oxygen   Home Oxygen Device Home Concentrator;Portable Concentrator;E-Tanks    Sleep Oxygen Prescription Continuous    Liters per minute 5    Home Exercise Oxygen Prescription Continuous    Liters per minute 15    Home Resting Oxygen Prescription None    Compliance with Home Oxygen  Use Yes             Oxygen Re-Evaluation:  Oxygen Re-Evaluation     Row Name 05/20/21 0938             Program Oxygen Prescription   Program Oxygen Prescription Continuous       Liters per minute 15  15 L treadmill, will monitor for rower         Goals/Expected Outcomes   Short Term Goals To learn and exhibit compliance with exercise, home and travel O2 prescription;To learn and understand importance of monitoring SPO2 with pulse oximeter and demonstrate accurate use of the pulse oximeter.;To learn and understand importance of maintaining oxygen saturations>88%;To learn and demonstrate proper pursed lip breathing techniques or other breathing techniques. ;To learn and demonstrate proper use of respiratory medications       Long  Term Goals Verbalizes importance of monitoring SPO2 with pulse oximeter and return demonstration;Exhibits compliance with exercise, home  and travel O2 prescription;Maintenance of O2 saturations>88%;Exhibits proper breathing techniques, such as pursed lip breathing or other method taught during program session;Compliance with respiratory medication;Demonstrates proper use of MDI's       Goals/Expected Outcomes Compliance and understanding of oxygen saturation monitoring and breathing techniques to decrease shortness of breath                Oxygen Discharge (Final Oxygen Re-Evaluation):  Oxygen Re-Evaluation - 05/20/21 0938       Program Oxygen Prescription   Program Oxygen Prescription Continuous    Liters per minute 15   15 L treadmill, will monitor for rower     Goals/Expected Outcomes   Short Term Goals To learn and exhibit compliance with exercise, home and travel O2 prescription;To learn and understand importance of monitoring SPO2 with pulse oximeter and demonstrate accurate use of the pulse oximeter.;To learn and understand importance of maintaining oxygen saturations>88%;To learn and demonstrate proper pursed lip breathing techniques or other  breathing techniques. ;To learn and demonstrate proper use of respiratory medications    Long  Term Goals Verbalizes importance of monitoring SPO2 with pulse oximeter and return demonstration;Exhibits compliance with exercise, home  and travel O2 prescription;Maintenance of O2 saturations>88%;Exhibits proper breathing techniques, such as pursed lip breathing or other method taught during program session;Compliance with respiratory medication;Demonstrates proper use of MDI's    Goals/Expected Outcomes Compliance and understanding of oxygen saturation monitoring and breathing techniques to decrease shortness of breath             Initial Exercise Prescription:  Initial Exercise Prescription - 05/20/21 1200       Date of Initial Exercise RX and Referring Provider   Date 05/20/21    Referring Provider Everardo All    Expected Discharge Date 07/25/21      Oxygen   Oxygen Intermittent    Liters 15    Maintain Oxygen Saturation 88% or higher      Treadmill   MPH 2    Grade 0    Minutes 15      Rower   Level 1    Watts  30    Minutes 15      Prescription Details   Frequency (times per week) 2    Duration Progress to 30 minutes of continuous aerobic without signs/symptoms of physical distress      Intensity   THRR 40-80% of Max Heartrate 62-124    Ratings of Perceived Exertion 11-13    Perceived Dyspnea 0-4      Progression   Progression Continue to progress workloads to maintain intensity without signs/symptoms of physical distress.      Resistance Training   Training Prescription Yes    Weight blue bands    Reps 10-15             Perform Capillary Blood Glucose checks as needed.  Exercise Prescription Changes:   Exercise Comments:   Exercise Goals and Review:   Exercise Goals     Row Name 05/20/21 1149             Exercise Goals   Increase Physical Activity Yes       Intervention Provide advice, education, support and counseling about physical  activity/exercise needs.;Develop an individualized exercise prescription for aerobic and resistive training based on initial evaluation findings, risk stratification, comorbidities and participant's personal goals.       Expected Outcomes Short Term: Attend rehab on a regular basis to increase amount of physical activity.;Long Term: Add in home exercise to make exercise part of routine and to increase amount of physical activity.;Long Term: Exercising regularly at least 3-5 days a week.       Increase Strength and Stamina Yes       Intervention Provide advice, education, support and counseling about physical activity/exercise needs.;Develop an individualized exercise prescription for aerobic and resistive training based on initial evaluation findings, risk stratification, comorbidities and participant's personal goals.       Expected Outcomes Short Term: Increase workloads from initial exercise prescription for resistance, speed, and METs.;Short Term: Perform resistance training exercises routinely during rehab and add in resistance training at home;Long Term: Improve cardiorespiratory fitness, muscular endurance and strength as measured by increased METs and functional capacity ( )       Able to understand and use rate of perceived exertion (RPE) scale Yes       Intervention Provide education and explanation on how to use RPE scale       Expected Outcomes Short Term: Able to use RPE daily in rehab to express subjective intensity level;Long Term:  Able to use RPE to guide intensity level when exercising independently       Able to understand and use Dyspnea scale Yes       Intervention Provide education and explanation on how to use Dyspnea scale       Expected Outcomes Short Term: Able to use Dyspnea scale daily in rehab to express subjective sense of shortness of breath during exertion;Long Term: Able to use Dyspnea scale to guide intensity level when exercising independently       Knowledge and  understanding of Target Heart Rate Range (THRR) Yes       Intervention Provide education and explanation of THRR including how the numbers were predicted and where they are located for reference       Expected Outcomes Short Term: Able to state/look up THRR;Short Term: Able to use daily as guideline for intensity in rehab;Long Term: Able to use THRR to govern intensity when exercising independently       Understanding of Exercise Prescription Yes  Intervention Provide education, explanation, and written materials on patient's individual exercise prescription       Expected Outcomes Short Term: Able to explain program exercise prescription;Long Term: Able to explain home exercise prescription to exercise independently                Exercise Goals Re-Evaluation :  Exercise Goals Re-Evaluation     Row Name 05/20/21 1152             Exercise Goal Re-Evaluation   Exercise Goals Review Increase Physical Activity;Increase Strength and Stamina;Able to understand and use rate of perceived exertion (RPE) scale;Able to understand and use Dyspnea scale;Knowledge and understanding of Target Heart Rate Range (THRR);Understanding of Exercise Prescription       Comments Brent Holloway is scheduled to begin exercise next week. Will monitor and progress as able.       Expected Outcomes Through exercise at rehab and home, the patient will decrease shortness of breath with daily activities and feel confident in carrying out an exercise regimen at home                Discharge Exercise Prescription (Final Exercise Prescription Changes):   Nutrition:  Target Goals: Understanding of nutrition guidelines, daily intake of sodium 1500mg , cholesterol 200mg , calories 30% from fat and 7% or less from saturated fats, daily to have 5 or more servings of fruits and vegetables.  Biometrics:  Pre Biometrics - 05/20/21 0936       Pre Biometrics   Grip Strength --              Nutrition Therapy Plan  and Nutrition Goals:   Nutrition Assessments:  MEDIFICTS Score Key: ?70 Need to make dietary changes  40-70 Heart Healthy Diet ? 40 Therapeutic Level Cholesterol Diet   Picture Your Plate Scores: <16 Unhealthy dietary pattern with much room for improvement. 41-50 Dietary pattern unlikely to meet recommendations for good health and room for improvement. 51-60 More healthful dietary pattern, with some room for improvement.  >60 Healthy dietary pattern, although there may be some specific behaviors that could be improved.    Nutrition Goals Re-Evaluation:   Nutrition Goals Discharge (Final Nutrition Goals Re-Evaluation):   Psychosocial: Target Goals: Acknowledge presence or absence of significant depression and/or stress, maximize coping skills, provide positive support system. Participant is able to verbalize types and ability to use techniques and skills needed for reducing stress and depression.  Initial Review & Psychosocial Screening:  Initial Psych Review & Screening - 05/20/21 1024       Initial Review   Current issues with None Identified      Family Dynamics   Good Support System? Yes    Comments No barriers or psychosocial concerns identified at this time.      Barriers   Psychosocial barriers to participate in program There are no identifiable barriers or psychosocial needs.      Screening Interventions   Interventions Encouraged to exercise             Quality of Life Scores:  Scores of 19 and below usually indicate a poorer quality of life in these areas.  A difference of  2-3 points is a clinically meaningful difference.  A difference of 2-3 points in the total score of the Quality of Life Index has been associated with significant improvement in overall quality of life, self-image, physical symptoms, and general health in studies assessing change in quality of life.  PHQ-9: Recent Review Flowsheet Data  Depression screen [facility] 2/9 06/13/2019  06/13/2019   Decreased Interest 0 0   Down, Depressed, Hopeless 0 0   PHQ - 2 Score 0 0   Altered sleeping 0 -   Tired, decreased energy 0 -   Change in appetite 0 -   Feeling bad or failure about yourself  0 -   Trouble concentrating 0 -   Moving slowly or fidgety/restless 0 -   Suicidal thoughts 0 -   PHQ-9 Score 0 -   Difficult doing work/chores Not difficult at all -      Interpretation of Total Score  Total Score Depression Severity:  1-4 = Minimal depression, 5-9 = Mild depression, 10-14 = Moderate depression, 15-19 = Moderately severe depression, 20-27 = Severe depression   Psychosocial Evaluation and Intervention:  Psychosocial Evaluation - 05/20/21 1024       Psychosocial Evaluation & Interventions   Interventions Encouraged to exercise with the program and follow exercise prescription    Comments No concerns identified    Expected Outcomes For Brent Holloway to continue to be free of psychosocial barriers or concerns while participating in pulmonary rehab.    Continue Psychosocial Services  No Follow up required             Psychosocial Re-Evaluation:   Psychosocial Discharge (Final Psychosocial Re-Evaluation):   Education: Education Goals: Education classes will be provided on a weekly basis, covering required topics. Participant will state understanding/return demonstration of topics presented.  Learning Barriers/Preferences:   Education Topics: Risk Factor Reduction:  -Group instruction that is supported by a PowerPoint presentation. Instructor discusses the definition of a risk factor, different risk factors for pulmonary disease, and how the heart and lungs work together.     Nutrition for Pulmonary Patient:  -Group instruction provided by PowerPoint slides, verbal discussion, and written materials to support subject matter. The instructor gives an explanation and review of healthy diet recommendations, which includes a discussion on weight management,  recommendations for fruit and vegetable consumption, as well as protein, fluid, caffeine, fiber, sodium, sugar, and alcohol. Tips for eating when patients are short of breath are discussed. Flowsheet Row PULMONARY REHAB OTHER RESPIRATORY from 10/27/2019 in [facility] REHAB  Date 10/25/19  Educator MD  Instruction Review Code 2- Demonstrated Understanding       Pursed Lip Breathing:  -Group instruction that is supported by demonstration and informational handouts. Instructor discusses the benefits of pursed lip and diaphragmatic breathing and detailed demonstration on how to preform both.     Oxygen Safety:  -Group instruction provided by PowerPoint, verbal discussion, and written material to support subject matter. There is an overview of "What is Oxygen" and "Why do we need it".  Instructor also reviews how to create a safe environment for oxygen use, the importance of using oxygen as prescribed, and the risks of noncompliance. There is a brief discussion on traveling with oxygen and resources the patient may utilize.   Oxygen Equipment:  -Group instruction provided by [facility] Staff utilizing handouts, written materials, and equipment demonstrations.   Signs and Symptoms:  -Group instruction provided by written material and verbal discussion to support subject matter. Warning signs and symptoms of infection, stroke, and heart attack are reviewed and when to call the physician/911 reinforced. Tips for preventing the spread of infection discussed.   Advanced Directives:  -Group instruction provided by verbal instruction and written material to support subject matter. Instructor reviews Advanced Directive laws and proper instruction for filling out  document.   Pulmonary Video:  -Group video education that reviews the importance of medication and oxygen compliance, exercise, good nutrition, pulmonary hygiene, and pursed lip and diaphragmatic breathing for the  pulmonary patient.   Exercise for the Pulmonary Patient:  -Group instruction that is supported by a PowerPoint presentation. Instructor discusses benefits of exercise, core components of exercise, frequency, duration, and intensity of an exercise routine, importance of utilizing pulse oximetry during exercise, safety while exercising, and options of places to exercise outside of rehab.     Pulmonary Medications:  -Verbally interactive group education provided by instructor with focus on inhaled medications and proper administration.   Anatomy and Physiology of the Respiratory System and Intimacy:  -Group instruction provided by PowerPoint, verbal discussion, and written material to support subject matter. Instructor reviews respiratory cycle and anatomical components of the respiratory system and their functions. Instructor also reviews differences in obstructive and restrictive respiratory diseases with examples of each. Intimacy, Sex, and Sexuality differences are reviewed with a discussion on how relationships can change when diagnosed with pulmonary disease. Common sexual concerns are reviewed. Flowsheet Row PULMONARY REHAB OTHER RESPIRATORY from 10/27/2019 in [facility] CARDIAC REHAB  Date 09/29/19  Educator DF  Instruction Review Code 2- Demonstrated Understanding       MD DAY -A group question and answer session with a medical doctor that allows participants to ask questions that relate to their pulmonary disease state.   OTHER EDUCATION -Group or individual verbal, written, or video instructions that support the educational goals of the pulmonary rehab program. Flowsheet Row PULMONARY REHAB OTHER RESPIRATORY from 10/27/2019 in [facility] CARDIAC REHAB  Date 09/22/19  Educator DF  Ashley Mariner your numbers]  Instruction Review Code 2- Demonstrated Understanding       Holiday Eating Survival Tips:  -Group instruction provided by PowerPoint slides,  verbal discussion, and written materials to support subject matter. The instructor gives patients tips, tricks, and techniques to help them not only survive but enjoy the holidays despite the onslaught of food that accompanies the holidays.   Knowledge Questionnaire Score:  Knowledge Questionnaire Score - 05/20/21 0957       Knowledge Questionnaire Score   Pre Score 16/18             Core Components/Risk Factors/Patient Goals at Admission:  Personal Goals and Risk Factors at Admission - 05/20/21 1026       Core Components/Risk Factors/Patient Goals on Admission   Improve shortness of breath with ADL's Yes    Intervention Provide education, individualized exercise plan and daily activity instruction to help decrease symptoms of SOB with activities of daily living.    Expected Outcomes Short Term: Improve cardiorespiratory fitness to achieve a reduction of symptoms when performing ADLs;Long Term: Be able to perform more ADLs without symptoms or delay the onset of symptoms    Increase knowledge of respiratory medications and ability to use respiratory devices properly  Yes    Intervention Provide education and demonstration as needed of appropriate use of medications, inhalers, and oxygen therapy.    Expected Outcomes Short Term: Achieves understanding of medications use. Understands that oxygen is a medication prescribed by physician. Demonstrates appropriate use of inhaler and oxygen therapy.;Long Term: Maintain appropriate use of medications, inhalers, and oxygen therapy.             Core Components/Risk Factors/Patient Goals Review:   Goals and Risk Factor Review     Row Name 05/20/21 [phone]  Core Components/Risk Factors/Patient Goals Review   Personal Goals Review Increase knowledge of respiratory medications and ability to use respiratory devices properly.;Improve shortness of breath with ADL's;Develop more efficient breathing techniques such as purse lipped  breathing and diaphragmatic breathing and practicing self-pacing with activity.                Core Components/Risk Factors/Patient Goals at Discharge (Final Review):   Goals and Risk Factor Review - 05/20/21 0959       Core Components/Risk Factors/Patient Goals Review   Personal Goals Review Increase knowledge of respiratory medications and ability to use respiratory devices properly.;Improve shortness of breath with ADL's;Develop more efficient breathing techniques such as purse lipped breathing and diaphragmatic breathing and practicing self-pacing with activity.             ITP Comments:   Comments:

## 2021-05-28 ENCOUNTER — Other Ambulatory Visit: Payer: Self-pay

## 2021-05-28 ENCOUNTER — Encounter (HOSPITAL_COMMUNITY)
Admission: RE | Admit: 2021-05-28 | Discharge: 2021-05-28 | Disposition: A | Discharge status: Home / Self Care | Payer: Medicare Other | Source: Ambulatory Visit | Attending: Pulmonary Disease | Admitting: Pulmonary Disease

## 2021-05-28 DIAGNOSIS — J849 Interstitial pulmonary disease, unspecified: Secondary | ICD-10-CM | POA: Diagnosis present

## 2021-05-28 NOTE — Progress Notes (Addendum)
Daily Session Note  Patient Details  Name: Brent Holloway MRN: [id number] Date of Birth: [birth year] Referring Provider:   April Manson Pulmonary Rehab Walk Test from 05/20/2021 in [place]  Referring Provider [name]       Encounter Date: 05/28/2021  Check In:  Session Check In - 05/28/21 1414       Check-In   Supervising physician immediately available to respond to emergencies [facility] immediately available    Physician(s) Dr. Pietro Cassis    Location [facility]    Staff Present Rosebud Poles, RN, Quentin Ore, MS, ACSM-CEP, Exercise Physiologist;Ayah Cozzolino Ysidro Evert, RN    Virtual Visit No    Medication changes reported     No    Fall or balance concerns reported    No    Tobacco Cessation No Change    Warm-up and Cool-down Performed as group-led instruction    Resistance Training Performed Yes    VAD Patient? No    PAD/SET Patient? No      Pain Assessment   Currently in Pain? No/denies    Multiple Pain Sites No             Capillary Blood Glucose: No results found for this or any previous visit (from the past 24 hour(s)).    Social History   Tobacco Use  Smoking Status Former   Packs/day: 0.25   Years: 44.00   Pack years: 11.00   Types: Cigars, Cigarettes   Start date: 56   Quit date: 03/29/2017   Years since quitting: 4.1  Smokeless Tobacco Never  Tobacco Comments   5-6 cigars daily     Goals Met:  Exercise tolerated well No report of concerns or symptoms today Strength training completed today  Goals Unmet:  Not Applicable  Comments: Service time is from 1318 to 1432 Completed first day of exercise.   Dr. Rodman Pickle is Medical Director for Pulmonary Rehab at [facility].

## 2021-05-28 NOTE — Progress Notes (Signed)
Pulmonary Individual Treatment Plan  Patient Details  Name: Koleton Duchemin MRN: [id number] Date of Birth: 1956/07/07 Referring Provider:   Doristine Devoid Pulmonary Rehab Walk Test from 05/20/2021 in [facility]ARD[facility]  Referring Provider Everardo All       Initial Encounter Date:  Flowsheet Row Pulmonary Rehab Walk Test from 05/20/2021 in [facility] CARDIAC REHAB  Date 05/20/21       Visit Diagnosis: Interstitial lung disease (HCC)  Patient's Home Medications on Admission:   Current Outpatient Medications:    BREO ELLIPTA 200-25 MCG/INH AEPB, USE 1 INHALATION BY MOUTH  DAILY, Disp: 180 each, Rfl: 3   Multiple Vitamins-Minerals (MULTIVITAMIN WITH MINERALS) tablet, Take 1 tablet by mouth daily., Disp: , Rfl:    Nintedanib (OFEV) 150 MG CAPS, TAKE 1 CAPSULE BY MOUTH  TWICE A DAY 12 HOURS APART  WITH FOOD, Disp: 60 capsule, Rfl: 10   Omega-3 Fatty Acids (FISH OIL) 1000 MG CAPS, Take 1 capsule by mouth daily., Disp: , Rfl:    omeprazole (PRILOSEC) 20 MG capsule, TAKE 1 CAPSULE BY MOUTH  DAILY, Disp: 90 capsule, Rfl: 3   XARELTO 20 MG TABS tablet, Take 20 mg by mouth daily., Disp: , Rfl: 2  Past Medical History: Past Medical History:  Diagnosis Date   DVT (deep venous thrombosis) (HCC)    Factor 5 Leiden mutation, heterozygous (HCC)    Interstitial lung disease (HCC)     Tobacco Use: Social History   Tobacco Use  Smoking Status Former   Packs/day: 0.25   Years: 44.00   Pack years: 11.00   Types: Cigars, Cigarettes   Start date: 15   Quit date: 03/29/2017   Years since quitting: 4.1  Smokeless Tobacco Never  Tobacco Comments   5-6 cigars daily     Labs: Recent Review Flowsheet Data   There is no flowsheet data to display.     Capillary Blood Glucose: Lab Results  Component Value Date   GLUCAP 96 08/09/2019     Pulmonary Assessment Scores:  Pulmonary Assessment Scores     Row Name 05/20/21 0940         ADL UCSD   ADL  Phase Entry     SOB Score total 66       CAT Score   CAT Score 12       mMRC Score   mMRC Score 4             UCSD: Self-administered rating of dyspnea associated with activities of daily living (ADLs) 6-point scale (0 = "not at all" to 5 = "maximal or unable to do because of breathlessness")  Scoring Scores range from 0 to 120.  Minimally important difference is 5 units  CAT: CAT can identify the health impairment of COPD patients and is better correlated with disease progression.  CAT has a scoring range of zero to 40. The CAT score is classified into four groups of low (less than 10), medium (10 - 20), high (21-30) and very high (31-40) based on the impact level of disease on health status. A CAT score over 10 suggests significant symptoms.  A worsening CAT score could be explained by an exacerbation, poor medication adherence, poor inhaler technique, or progression of COPD or comorbid conditions.  CAT MCID is 2 points  mMRC: mMRC (Modified Medical Research Council) Dyspnea Scale is used to assess the degree of baseline functional disability in patients of respiratory disease due to dyspnea. No minimal important difference is  established. A decrease in score of 1 point or greater is considered a positive change.   Pulmonary Function Assessment:  Pulmonary Function Assessment - 05/20/21 0940       Breath   Bilateral Breath Sounds Clear    Shortness of Breath Yes;Limiting activity             Exercise Target Goals: Exercise Program Goal: Individual exercise prescription set using results from initial 6 min walk test and THRR while considering  patient's activity barriers and safety.   Exercise Prescription Goal: Initial exercise prescription builds to 30-45 minutes a day of aerobic activity, 2-3 days per week.  Home exercise guidelines will be given to patient during program as part of exercise prescription that the participant will acknowledge.  Activity Barriers &  Risk Stratification:  Activity Barriers & Cardiac Risk Stratification - 05/20/21 0936       Activity Barriers & Cardiac Risk Stratification   Activity Barriers Deconditioning;Muscular Weakness;Shortness of Breath             6 Minute Walk:  6 Minute Walk     Row Name 05/20/21 1144         6 Minute Walk   Phase Initial     Distance 1256 feet     Walk Time 6 minutes     # of Rest Breaks 0     MPH 2.38     METS 3.18     RPE 11     Perceived Dyspnea  1     VO2 Peak 11.14     Symptoms No     Resting HR 81 bpm     Resting BP 112/68     Resting Oxygen Saturation  100 %     Exercise Oxygen Saturation  during 6 min walk 82 %     Max Ex. HR 120 bpm     Max Ex. BP 134/68     2 Minute Post BP 116/68       Interval HR   1 Minute HR 101     2 Minute HR 116     3 Minute HR 120     4 Minute HR 119     5 Minute HR 116     6 Minute HR 113     2 Minute Post HR 79     Interval Heart Rate? Yes       Interval Oxygen   Interval Oxygen? Yes     Baseline Oxygen Saturation % 100 %     1 Minute Oxygen Saturation % 96 %     1 Minute Liters of Oxygen 6 L     2 Minute Oxygen Saturation % 85 %     2 Minute Liters of Oxygen 6 L     3 Minute Oxygen Saturation % 82 %     3 Minute Liters of Oxygen 8 L     4 Minute Oxygen Saturation % 84 %     4 Minute Liters of Oxygen 10 L     5 Minute Oxygen Saturation % 89 %     5 Minute Liters of Oxygen 15 L     6 Minute Oxygen Saturation % 90 %     6 Minute Liters of Oxygen 15 L     2 Minute Post Oxygen Saturation % 98 %     2 Minute Post Liters of Oxygen 6 L  Oxygen Initial Assessment:  Oxygen Initial Assessment - 05/20/21 0938       Home Oxygen   Home Oxygen Device Home Concentrator;Portable Concentrator;E-Tanks    Sleep Oxygen Prescription Continuous    Liters per minute 5    Home Exercise Oxygen Prescription Continuous    Liters per minute 15    Home Resting Oxygen Prescription None    Compliance with Home Oxygen  Use Yes             Oxygen Re-Evaluation:  Oxygen Re-Evaluation     Row Name 05/20/21 0938             Program Oxygen Prescription   Program Oxygen Prescription Continuous       Liters per minute 15  15 L treadmill, will monitor for rower         Goals/Expected Outcomes   Short Term Goals To learn and exhibit compliance with exercise, home and travel O2 prescription;To learn and understand importance of monitoring SPO2 with pulse oximeter and demonstrate accurate use of the pulse oximeter.;To learn and understand importance of maintaining oxygen saturations>88%;To learn and demonstrate proper pursed lip breathing techniques or other breathing techniques. ;To learn and demonstrate proper use of respiratory medications       Long  Term Goals Verbalizes importance of monitoring SPO2 with pulse oximeter and return demonstration;Exhibits compliance with exercise, home  and travel O2 prescription;Maintenance of O2 saturations>88%;Exhibits proper breathing techniques, such as pursed lip breathing or other method taught during program session;Compliance with respiratory medication;Demonstrates proper use of MDI's       Goals/Expected Outcomes Compliance and understanding of oxygen saturation monitoring and breathing techniques to decrease shortness of breath                Oxygen Discharge (Final Oxygen Re-Evaluation):  Oxygen Re-Evaluation - 05/20/21 0938       Program Oxygen Prescription   Program Oxygen Prescription Continuous    Liters per minute 15   15 L treadmill, will monitor for rower     Goals/Expected Outcomes   Short Term Goals To learn and exhibit compliance with exercise, home and travel O2 prescription;To learn and understand importance of monitoring SPO2 with pulse oximeter and demonstrate accurate use of the pulse oximeter.;To learn and understand importance of maintaining oxygen saturations>88%;To learn and demonstrate proper pursed lip breathing techniques or other  breathing techniques. ;To learn and demonstrate proper use of respiratory medications    Long  Term Goals Verbalizes importance of monitoring SPO2 with pulse oximeter and return demonstration;Exhibits compliance with exercise, home  and travel O2 prescription;Maintenance of O2 saturations>88%;Exhibits proper breathing techniques, such as pursed lip breathing or other method taught during program session;Compliance with respiratory medication;Demonstrates proper use of MDI's    Goals/Expected Outcomes Compliance and understanding of oxygen saturation monitoring and breathing techniques to decrease shortness of breath             Initial Exercise Prescription:  Initial Exercise Prescription - 05/20/21 1200       Date of Initial Exercise RX and Referring Provider   Date 05/20/21    Referring Provider Everardo All    Expected Discharge Date 07/25/21      Oxygen   Oxygen Intermittent    Liters 15    Maintain Oxygen Saturation 88% or higher      Treadmill   MPH 2    Grade 0    Minutes 15      Rower   Level 1    Watts  30    Minutes 15      Prescription Details   Frequency (times per week) 2    Duration Progress to 30 minutes of continuous aerobic without signs/symptoms of physical distress      Intensity   THRR 40-80% of Max Heartrate 62-124    Ratings of Perceived Exertion 11-13    Perceived Dyspnea 0-4      Progression   Progression Continue to progress workloads to maintain intensity without signs/symptoms of physical distress.      Resistance Training   Training Prescription Yes    Weight blue bands    Reps 10-15             Perform Capillary Blood Glucose checks as needed.  Exercise Prescription Changes:   Exercise Comments:   Exercise Goals and Review:   Exercise Goals     Row Name 05/20/21 1149             Exercise Goals   Increase Physical Activity Yes       Intervention Provide advice, education, support and counseling about physical  activity/exercise needs.;Develop an individualized exercise prescription for aerobic and resistive training based on initial evaluation findings, risk stratification, comorbidities and participant's personal goals.       Expected Outcomes Short Term: Attend rehab on a regular basis to increase amount of physical activity.;Long Term: Add in home exercise to make exercise part of routine and to increase amount of physical activity.;Long Term: Exercising regularly at least 3-5 days a week.       Increase Strength and Stamina Yes       Intervention Provide advice, education, support and counseling about physical activity/exercise needs.;Develop an individualized exercise prescription for aerobic and resistive training based on initial evaluation findings, risk stratification, comorbidities and participant's personal goals.       Expected Outcomes Short Term: Increase workloads from initial exercise prescription for resistance, speed, and METs.;Short Term: Perform resistance training exercises routinely during rehab and add in resistance training at home;Long Term: Improve cardiorespiratory fitness, muscular endurance and strength as measured by increased METs and functional capacity ( )       Able to understand and use rate of perceived exertion (RPE) scale Yes       Intervention Provide education and explanation on how to use RPE scale       Expected Outcomes Short Term: Able to use RPE daily in rehab to express subjective intensity level;Long Term:  Able to use RPE to guide intensity level when exercising independently       Able to understand and use Dyspnea scale Yes       Intervention Provide education and explanation on how to use Dyspnea scale       Expected Outcomes Short Term: Able to use Dyspnea scale daily in rehab to express subjective sense of shortness of breath during exertion;Long Term: Able to use Dyspnea scale to guide intensity level when exercising independently       Knowledge and  understanding of Target Heart Rate Range (THRR) Yes       Intervention Provide education and explanation of THRR including how the numbers were predicted and where they are located for reference       Expected Outcomes Short Term: Able to state/look up THRR;Short Term: Able to use daily as guideline for intensity in rehab;Long Term: Able to use THRR to govern intensity when exercising independently       Understanding of Exercise Prescription Yes  Intervention Provide education, explanation, and written materials on patient's individual exercise prescription       Expected Outcomes Short Term: Able to explain program exercise prescription;Long Term: Able to explain home exercise prescription to exercise independently                Exercise Goals Re-Evaluation :  Exercise Goals Re-Evaluation     Row Name 05/20/21 1152             Exercise Goal Re-Evaluation   Exercise Goals Review Increase Physical Activity;Increase Strength and Stamina;Able to understand and use rate of perceived exertion (RPE) scale;Able to understand and use Dyspnea scale;Knowledge and understanding of Target Heart Rate Range (THRR);Understanding of Exercise Prescription       Comments Genevie Cheshire is scheduled to begin exercise next week. Will monitor and progress as able.       Expected Outcomes Through exercise at rehab and home, the patient will decrease shortness of breath with daily activities and feel confident in carrying out an exercise regimen at home                Discharge Exercise Prescription (Final Exercise Prescription Changes):   Nutrition:  Target Goals: Understanding of nutrition guidelines, daily intake of sodium 1500mg , cholesterol 200mg , calories 30% from fat and 7% or less from saturated fats, daily to have 5 or more servings of fruits and vegetables.  Biometrics:  Pre Biometrics - 05/20/21 0936       Pre Biometrics   Grip Strength --              Nutrition Therapy Plan  and Nutrition Goals:   Nutrition Assessments:  MEDIFICTS Score Key: ?70 Need to make dietary changes  40-70 Heart Healthy Diet ? 40 Therapeutic Level Cholesterol Diet   Picture Your Plate Scores: <16 Unhealthy dietary pattern with much room for improvement. 41-50 Dietary pattern unlikely to meet recommendations for good health and room for improvement. 51-60 More healthful dietary pattern, with some room for improvement.  >60 Healthy dietary pattern, although there may be some specific behaviors that could be improved.    Nutrition Goals Re-Evaluation:   Nutrition Goals Discharge (Final Nutrition Goals Re-Evaluation):   Psychosocial: Target Goals: Acknowledge presence or absence of significant depression and/or stress, maximize coping skills, provide positive support system. Participant is able to verbalize types and ability to use techniques and skills needed for reducing stress and depression.  Initial Review & Psychosocial Screening:  Initial Psych Review & Screening - 05/20/21 1024       Initial Review   Current issues with None Identified      Family Dynamics   Good Support System? Yes    Comments No barriers or psychosocial concerns identified at this time.      Barriers   Psychosocial barriers to participate in program There are no identifiable barriers or psychosocial needs.      Screening Interventions   Interventions Encouraged to exercise             Quality of Life Scores:  Scores of 19 and below usually indicate a poorer quality of life in these areas.  A difference of  2-3 points is a clinically meaningful difference.  A difference of 2-3 points in the total score of the Quality of Life Index has been associated with significant improvement in overall quality of life, self-image, physical symptoms, and general health in studies assessing change in quality of life.  PHQ-9: Recent Review Flowsheet Data  Depression screen [facility] 2/9 06/13/2019  06/13/2019   Decreased Interest 0 0   Down, Depressed, Hopeless 0 0   PHQ - 2 Score 0 0   Altered sleeping 0 -   Tired, decreased energy 0 -   Change in appetite 0 -   Feeling bad or failure about yourself  0 -   Trouble concentrating 0 -   Moving slowly or fidgety/restless 0 -   Suicidal thoughts 0 -   PHQ-9 Score 0 -   Difficult doing work/chores Not difficult at all -      Interpretation of Total Score  Total Score Depression Severity:  1-4 = Minimal depression, 5-9 = Mild depression, 10-14 = Moderate depression, 15-19 = Moderately severe depression, 20-27 = Severe depression   Psychosocial Evaluation and Intervention:  Psychosocial Evaluation - 05/20/21 1024       Psychosocial Evaluation & Interventions   Interventions Encouraged to exercise with the program and follow exercise prescription    Comments No concerns identified    Expected Outcomes For Billy to continue to be free of psychosocial barriers or concerns while participating in pulmonary rehab.    Continue Psychosocial Services  No Follow up required             Psychosocial Re-Evaluation:  Psychosocial Re-Evaluation     Row Name 05/21/21 1528             Psychosocial Re-Evaluation   Current issues with None Identified       Comments Genevie Cheshire has not started his exercise sessions.       Expected Outcomes That pt will continue to have no pshcyosocial barriers that will interfer with his participation in pulmonary rehab.       Interventions Encouraged to attend Pulmonary Rehabilitation for the exercise       Continue Psychosocial Services  No Follow up required                Psychosocial Discharge (Final Psychosocial Re-Evaluation):  Psychosocial Re-Evaluation - 05/21/21 1528       Psychosocial Re-Evaluation   Current issues with None Identified    Comments Genevie Cheshire has not started his exercise sessions.    Expected Outcomes That pt will continue to have no pshcyosocial barriers that will  interfer with his participation in pulmonary rehab.    Interventions Encouraged to attend Pulmonary Rehabilitation for the exercise    Continue Psychosocial Services  No Follow up required             Education: Education Goals: Education classes will be provided on a weekly basis, covering required topics. Participant will state understanding/return demonstration of topics presented.  Learning Barriers/Preferences:   Education Topics: Risk Factor Reduction:  -Group instruction that is supported by a PowerPoint presentation. Instructor discusses the definition of a risk factor, different risk factors for pulmonary disease, and how the heart and lungs work together.     Nutrition for Pulmonary Patient:  -Group instruction provided by PowerPoint slides, verbal discussion, and written materials to support subject matter. The instructor gives an explanation and review of healthy diet recommendations, which includes a discussion on weight management, recommendations for fruit and vegetable consumption, as well as protein, fluid, caffeine, fiber, sodium, sugar, and alcohol. Tips for eating when patients are short of breath are discussed. Flowsheet Row PULMONARY REHAB OTHER RESPIRATORY from 10/27/2019 in [facility] CARDI[facility] REHAB  Date 10/25/19  Educator MD  Instruction Review Code 2- Demonstrated Understanding  Pursed Lip Breathing:  -Group instruction that is supported by demonstration and informational handouts. Instructor discusses the benefits of pursed lip and diaphragmatic breathing and detailed demonstration on how to preform both.     Oxygen Safety:  -Group instruction provided by PowerPoint, verbal discussion, and written material to support subject matter. There is an overview of "What is Oxygen" and "Why do we need it".  Instructor also reviews how to create a safe environment for oxygen use, the importance of using oxygen as prescribed, and the risks of  noncompliance. There is a brief discussion on traveling with oxygen and resources the patient may utilize.   Oxygen Equipment:  -Group instruction provided by [facility] Staff utilizing handouts, written materials, and equipment demonstrations.   Signs and Symptoms:  -Group instruction provided by written material and verbal discussion to support subject matter. Warning signs and symptoms of infection, stroke, and heart attack are reviewed and when to call the physician/911 reinforced. Tips for preventing the spread of infection discussed.   Advanced Directives:  -Group instruction provided by verbal instruction and written material to support subject matter. Instructor reviews Advanced Directive laws and proper instruction for filling out document.   Pulmonary Video:  -Group video education that reviews the importance of medication and oxygen compliance, exercise, good nutrition, pulmonary hygiene, and pursed lip and diaphragmatic breathing for the pulmonary patient.   Exercise for the Pulmonary Patient:  -Group instruction that is supported by a PowerPoint presentation. Instructor discusses benefits of exercise, core components of exercise, frequency, duration, and intensity of an exercise routine, importance of utilizing pulse oximetry during exercise, safety while exercising, and options of places to exercise outside of rehab.     Pulmonary Medications:  -Verbally interactive group education provided by instructor with focus on inhaled medications and proper administration.   Anatomy and Physiology of the Respiratory System and Intimacy:  -Group instruction provided by PowerPoint, verbal discussion, and written material to support subject matter. Instructor reviews respiratory cycle and anatomical components of the respiratory system and their functions. Instructor also reviews differences in obstructive and restrictive respiratory diseases with examples of each. Intimacy, Sex, and  Sexuality differences are reviewed with a discussion on how relationships can change when diagnosed with pulmonary disease. Common sexual concerns are reviewed. Flowsheet Row PULMONARY REHAB OTHER RESPIRATORY from 10/27/2019 in [facility] CARDIAC REHAB  Date 09/29/19  Educator DF  Instruction Review Code 2- Demonstrated Understanding       MD DAY -A group question and answer session with a medical doctor that allows participants to ask questions that relate to their pulmonary disease state.   OTHER EDUCATION -Group or individual verbal, written, or video instructions that support the educational goals of the pulmonary rehab program. Flowsheet Row PULMONARY REHAB OTHER RESPIRATORY from 10/27/2019 in [facility] CARDIAC REHAB  Date 09/22/19  Educator DF  Ashley Mariner your numbers]  Instruction Review Code 2- Demonstrated Understanding       Holiday Eating Survival Tips:  -Group instruction provided by PowerPoint slides, verbal discussion, and written materials to support subject matter. The instructor gives patients tips, tricks, and techniques to help them not only survive but enjoy the holidays despite the onslaught of food that accompanies the holidays.   Knowledge Questionnaire Score:  Knowledge Questionnaire Score - 05/20/21 0957       Knowledge Questionnaire Score   Pre Score 16/18             Core Components/Risk Factors/Patient Goals at Admission:  Personal Goals and Risk Factors at Admission - 05/20/21 1026       Core Components/Risk Factors/Patient Goals on Admission   Improve shortness of breath with ADL's Yes    Intervention Provide education, individualized exercise plan and daily activity instruction to help decrease symptoms of SOB with activities of daily living.    Expected Outcomes Short Term: Improve cardiorespiratory fitness to achieve a reduction of symptoms when performing ADLs;Long Term: Be able to perform more ADLs without  symptoms or delay the onset of symptoms    Increase knowledge of respiratory medications and ability to use respiratory devices properly  Yes    Intervention Provide education and demonstration as needed of appropriate use of medications, inhalers, and oxygen therapy.    Expected Outcomes Short Term: Achieves understanding of medications use. Understands that oxygen is a medication prescribed by physician. Demonstrates appropriate use of inhaler and oxygen therapy.;Long Term: Maintain appropriate use of medications, inhalers, and oxygen therapy.             Core Components/Risk Factors/Patient Goals Review:   Goals and Risk Factor Review     Row Name 05/20/21 0959 05/21/21 1535           Core Components/Risk Factors/Patient Goals Review   Personal Goals Review Increase knowledge of respiratory medications and ability to use respiratory devices properly.;Improve shortness of breath with ADL's;Develop more efficient breathing techniques such as purse lipped breathing and diaphragmatic breathing and practicing self-pacing with activity. Improve shortness of breath with ADL's;Develop more efficient breathing techniques such as purse lipped breathing and diaphragmatic breathing and practicing self-pacing with activity.;Increase knowledge of respiratory medications and ability to use respiratory devices properly.      Review -- He will begin exercise sessions next week, to early to assess goals.      Expected Outcomes -- See admission goals.               Core Components/Risk Factors/Patient Goals at Discharge (Final Review):   Goals and Risk Factor Review - 05/21/21 1535       Core Components/Risk Factors/Patient Goals Review   Personal Goals Review Improve shortness of breath with ADL's;Develop more efficient breathing techniques such as purse lipped breathing and diaphragmatic breathing and practicing self-pacing with activity.;Increase knowledge of respiratory medications and ability  to use respiratory devices properly.    Review He will begin exercise sessions next week, to early to assess goals.    Expected Outcomes See admission goals.             ITP Comments:   Comments: ITP REVIEW Pt is making expected progress toward pulmonary rehab goals after completing 0 sessions. Recommend continued exercise, life style modification, education, and utilization of breathing techniques to increase stamina and strength and decrease shortness of breath with exertion.

## 2021-05-30 ENCOUNTER — Encounter (HOSPITAL_COMMUNITY)
Admission: RE | Admit: 2021-05-30 | Discharge: 2021-05-30 | Disposition: A | Discharge status: Home / Self Care | Payer: Medicare Other | Source: Ambulatory Visit | Attending: Pulmonary Disease | Admitting: Pulmonary Disease

## 2021-05-30 ENCOUNTER — Other Ambulatory Visit: Payer: Self-pay

## 2021-05-30 DIAGNOSIS — J849 Interstitial pulmonary disease, unspecified: Secondary | ICD-10-CM

## 2021-05-30 NOTE — Progress Notes (Signed)
Daily Session Note  Patient Details  Name: Khalib Fendley MRN: [id number] Date of Birth: 12-27-1955 Referring Provider:   April Manson Pulmonary Rehab Walk Test from 05/20/2021 in [place]  Referring Provider [name]       Encounter Date: 05/30/2021  Check In:  Session Check In - 05/30/21 1422       Check-In   Supervising physician immediately available to respond to emergencies [facility] immediately available    Physician(s) Dr. Broadus John    Location [facility]    Staff Present Rosebud Poles, RN, Quentin Ore, MS, ACSM-CEP, Exercise Physiologist;Deatra Mcmahen Ysidro Evert, RN    Virtual Visit No    Medication changes reported     No    Fall or balance concerns reported    No    Tobacco Cessation No Change    Warm-up and Cool-down Performed as group-led instruction    Resistance Training Performed Yes    VAD Patient? No    PAD/SET Patient? No      Pain Assessment   Currently in Pain? No/denies    Multiple Pain Sites No             Capillary Blood Glucose: No results found for this or any previous visit (from the past 24 hour(s)).    Social History   Tobacco Use  Smoking Status Former   Packs/day: 0.25   Years: 44.00   Pack years: 11.00   Types: Cigars, Cigarettes   Start date: 47   Quit date: 03/29/2017   Years since quitting: 4.1  Smokeless Tobacco Never  Tobacco Comments   5-6 cigars daily     Goals Met:  Exercise tolerated well No report of concerns or symptoms today Strength training completed today  Goals Unmet:  Not Applicable  Comments: Service time is from 1318 to [place]    Dr. Rodman Pickle is Medical Director for Pulmonary Rehab at [facility]

## 2021-06-04 ENCOUNTER — Other Ambulatory Visit: Payer: Self-pay

## 2021-06-04 ENCOUNTER — Encounter (HOSPITAL_COMMUNITY)
Admission: RE | Admit: 2021-06-04 | Discharge: 2021-06-04 | Disposition: A | Discharge status: Home / Self Care | Payer: Medicare Other | Source: Ambulatory Visit | Attending: Pulmonary Disease | Admitting: Pulmonary Disease

## 2021-06-04 VITALS — Wt 221.6 lb

## 2021-06-04 DIAGNOSIS — J849 Interstitial pulmonary disease, unspecified: Secondary | ICD-10-CM

## 2021-06-04 NOTE — Progress Notes (Signed)
Daily Session Note  Patient Details  Name: Brent Holloway MRN: [id number] Date of Birth: [birth year] Referring Provider:   April Manson Pulmonary Rehab Walk Test from 05/20/2021 in [place]  Referring Provider [name]       Encounter Date: 06/04/2021  Check In:  Session Check In - 06/04/21 1428       Check-In   Supervising physician immediately available to respond to emergencies [facility] immediately available    Physician(s) Dr. Broadus John    [facility]    Staff Present Rosebud Poles, RN, Quentin Ore, MS, ACSM-CEP, Exercise Physiologist;Lisa Ysidro Evert, RN    Virtual Visit No    Medication changes reported     No    Fall or balance concerns reported    No    Tobacco Cessation No Change    Warm-up and Cool-down Performed as group-led instruction    Resistance Training Performed Yes    VAD Patient? No    PAD/SET Patient? No      Pain Assessment   Currently in Pain? No/denies    Multiple Pain Sites No             Capillary Blood Glucose: No results found for this or any previous visit (from the past 24 hour(s)).   Exercise Prescription Changes - 06/04/21 1400       Response to Exercise   Blood Pressure (Admit) 130/80    Blood Pressure (Exercise) 146/80    Blood Pressure (Exit) 122/62    Heart Rate (Admit) 78 bpm    Heart Rate (Exercise) 117 bpm    Heart Rate (Exit) 91 bpm    Oxygen Saturation (Admit) 94 %    Oxygen Saturation (Exercise) 89 %    Oxygen Saturation (Exit) 100 %    Rating of Perceived Exertion (Exercise) 13    Perceived Dyspnea (Exercise) 2      Resistance Training   Training Prescription Yes    Weight blue bands    Reps 10-15      Oxygen   Oxygen Intermittent    Liters 15      Treadmill   MPH 2    Grade 0    Minutes 15      Rower   Level 1    Watts 18    Minutes 15      Oxygen   Maintain Oxygen Saturation 88% or higher             Social History    Tobacco Use  Smoking Status Former   Packs/day: 0.25   Years: 44.00   Pack years: 11.00   Types: Cigars, Cigarettes   Start date: 47   Quit date: 03/29/2017   Years since quitting: 4.1  Smokeless Tobacco Never  Tobacco Comments   5-6 cigars daily     Goals Met:  Proper associated with RPD/PD & O2 Sat Independence with exercise equipment Exercise tolerated well No report of concerns or symptoms today Strength training completed today  Goals Unmet:  Not Applicable  Comments: Service time is from 1330 to 1435.    Dr. Rodman Pickle is Medical Director for Pulmonary Rehab at [facility].

## 2021-06-06 ENCOUNTER — Encounter (HOSPITAL_COMMUNITY): Payer: Medicare Other

## 2021-06-11 ENCOUNTER — Telehealth (HOSPITAL_COMMUNITY): Payer: Self-pay | Admitting: Family Medicine

## 2021-06-11 ENCOUNTER — Encounter (HOSPITAL_COMMUNITY): Payer: Medicare Other

## 2021-06-18 ENCOUNTER — Encounter (HOSPITAL_COMMUNITY)
Admission: RE | Admit: 2021-06-18 | Discharge: 2021-06-18 | Disposition: A | Discharge status: Home / Self Care | Payer: Medicare Other | Source: Ambulatory Visit | Attending: Pulmonary Disease | Admitting: Pulmonary Disease

## 2021-06-18 ENCOUNTER — Other Ambulatory Visit: Payer: Self-pay

## 2021-06-18 VITALS — Wt 220.7 lb

## 2021-06-18 DIAGNOSIS — J849 Interstitial pulmonary disease, unspecified: Secondary | ICD-10-CM

## 2021-06-18 NOTE — Progress Notes (Signed)
Daily Session Note  Patient Details  Name: Brent Holloway MRN: [id number] Date of Birth: October 22, 1955 Referring Provider:   April Manson Pulmonary Rehab Walk Test from 05/20/2021 in [place]  Referring Provider [name]       Encounter Date: 06/18/2021  Check In:  Session Check In - 06/18/21 1353       Check-In   Supervising physician immediately available to respond to emergencies [facility] immediately available    Physician(s) Dr. Broadus John    [facility]    Staff Present Rosebud Poles, RN, Quentin Ore, MS, ACSM-CEP, Exercise Physiologist    Virtual Visit No    Medication changes reported     No    Fall or balance concerns reported    No    Tobacco Cessation No Change    Warm-up and Cool-down Performed as group-led instruction    Resistance Training Performed Yes    VAD Patient? No    PAD/SET Patient? No      Pain Assessment   Currently in Pain? No/denies    Multiple Pain Sites No             Capillary Blood Glucose: No results found for this or any previous visit (from the past 24 hour(s)).   Exercise Prescription Changes - 06/18/21 1458       Response to Exercise   Blood Pressure (Admit) 112/72    Blood Pressure (Exercise) 138/72    Blood Pressure (Exit) 110/62    Heart Rate (Admit) 82 bpm    Heart Rate (Exercise) 112 bpm    Heart Rate (Exit) 94 bpm    Oxygen Saturation (Admit) 100 %    Oxygen Saturation (Exercise) 90 %    Oxygen Saturation (Exit) 99 %    Rating of Perceived Exertion (Exercise) 13    Perceived Dyspnea (Exercise) 2    Duration Continue with 30 min of aerobic exercise without signs/symptoms of physical distress.    Intensity THRR unchanged      Progression   Progression Continue to progress workloads to maintain intensity without signs/symptoms of physical distress.      Resistance Training   Training Prescription Yes    Weight blue bands    Reps 10-15    Time 10  Minutes      Oxygen   Oxygen Continuous    Liters 15      Treadmill   MPH 2    Grade 0    Minutes 15    METs 2.53      Rower   Level 2    Watts 22    Minutes 15             Social History   Tobacco Use  Smoking Status Former   Packs/day: 0.25   Years: 44.00   Pack years: 11.00   Types: Cigars, Cigarettes   Start date: 1974   Quit date: 03/29/2017   Years since quitting: 4.2  Smokeless Tobacco Never  Tobacco Comments   5-6 cigars daily     Goals Met:  Independence with exercise equipment Exercise tolerated well No report of concerns or symptoms today Strength training completed today  Goals Unmet:  Not Applicable  Comments: Service time is from 1330 to 1431.    Dr. Rodman Pickle is Medical Director for Pulmonary Rehab at [facility].

## 2021-06-20 ENCOUNTER — Encounter (HOSPITAL_COMMUNITY)
Admission: RE | Admit: 2021-06-20 | Discharge: 2021-06-20 | Disposition: A | Discharge status: Home / Self Care | Payer: Medicare Other | Source: Ambulatory Visit | Attending: Pulmonary Disease | Admitting: Pulmonary Disease

## 2021-06-20 ENCOUNTER — Other Ambulatory Visit: Payer: Self-pay

## 2021-06-20 DIAGNOSIS — J849 Interstitial pulmonary disease, unspecified: Secondary | ICD-10-CM | POA: Insufficient documentation

## 2021-06-20 NOTE — Progress Notes (Signed)
Daily Session Note  Patient Details  Name: Brent Holloway MRN: [id number] Date of Birth: [birth year] Referring Provider:   April Manson Pulmonary Rehab Walk Test from 05/20/2021 in [place]  Referring Provider [name]       Encounter Date: 06/20/2021  Check In:  Session Check In - 06/20/21 1335       Check-In   Supervising physician immediately available to respond to emergencies [facility] immediately available    Physician(s) Dr. Starla Link    Location [facility]    Staff Present Rosebud Poles, RN, Quentin Ore, MS, ACSM-CEP, Exercise Physiologist    Virtual Visit No    Medication changes reported     No    Fall or balance concerns reported    No    Tobacco Cessation No Change    Warm-up and Cool-down Performed as group-led instruction    Resistance Training Performed Yes    VAD Patient? No    PAD/SET Patient? No      Pain Assessment   Currently in Pain? No/denies    Multiple Pain Sites No             Capillary Blood Glucose: No results found for this or any previous visit (from the past 24 hour(s)).    Social History   Tobacco Use  Smoking Status Former   Packs/day: 0.25   Years: 44.00   Pack years: 11.00   Types: Cigars, Cigarettes   Start date: 74   Quit date: 03/29/2017   Years since quitting: 4.2  Smokeless Tobacco Never  Tobacco Comments   5-6 cigars daily     Goals Met:  Proper associated with RPD/PD & O2 Sat Independence with exercise equipment Exercise tolerated well No report of concerns or symptoms today Strength training completed today  Goals Unmet:  Not Applicable  Comments: Service time is from 1315 to 1430.    Dr. Rodman Pickle is Medical Director for Pulmonary Rehab at [facility].

## 2021-06-25 ENCOUNTER — Encounter (HOSPITAL_COMMUNITY)
Admission: RE | Admit: 2021-06-25 | Discharge: 2021-06-25 | Disposition: A | Discharge status: Home / Self Care | Payer: Medicare Other | Source: Ambulatory Visit | Attending: Pulmonary Disease | Admitting: Pulmonary Disease

## 2021-06-25 ENCOUNTER — Other Ambulatory Visit: Payer: Self-pay

## 2021-06-25 DIAGNOSIS — J849 Interstitial pulmonary disease, unspecified: Secondary | ICD-10-CM

## 2021-06-25 NOTE — Progress Notes (Signed)
Daily Session Note  Patient Details  Name: Brent Holloway MRN: [id number] Date of Birth: 1955/12/05 Referring Provider:   April Manson Pulmonary Rehab Walk Test from 05/20/2021 in [place]  Referring Provider [name]       Encounter Date: 06/25/2021  Check In:  Session Check In - 06/25/21 1421       Check-In   Supervising physician immediately available to respond to emergencies [facility] immediately available    Physician(s) Dr. Cathlean Sauer    [facility]    Staff Present Rosebud Poles, RN, BSN;Kirk Basquez Ysidro Evert, Cathleen Fears, MS, ACSM-CEP, Exercise Physiologist    Virtual Visit No    Medication changes reported     No    Fall or balance concerns reported    No    Tobacco Cessation No Change    Warm-up and Cool-down Performed as group-led instruction    Resistance Training Performed Yes    VAD Patient? No    PAD/SET Patient? No      Pain Assessment   Currently in Pain? No/denies    Multiple Pain Sites No             Capillary Blood Glucose: No results found for this or any previous visit (from the past 24 hour(s)).    Social History   Tobacco Use  Smoking Status Former   Packs/day: 0.25   Years: 44.00   Pack years: 11.00   Types: Cigars, Cigarettes   Start date: 51   Quit date: 03/29/2017   Years since quitting: 4.2  Smokeless Tobacco Never  Tobacco Comments   5-6 cigars daily     Goals Met:  Exercise tolerated well No report of concerns or symptoms today Strength training completed today  Goals Unmet:  Not Applicable  Comments: Service time is from 1320 to 1440    Dr. Rodman Pickle is Medical Director for Pulmonary Rehab at [facility].

## 2021-06-25 NOTE — Progress Notes (Signed)
Pulmonary Individual Treatment Plan  Patient Details  Name: Brent Holloway MRN: [id number] Date of Birth: [birth year] Referring Provider:   Doristine Devoid Pulmonary Rehab Walk Test from 05/20/2021 in [facility] CARDI[facility]  Referring Provider Everardo All       Initial Encounter Date:  Flowsheet Row Pulmonary Rehab Walk Test from 05/20/2021 in [facility] CARDIAC REHAB  Date 05/20/21       Visit Diagnosis: Interstitial lung disease (HCC)  Patient's Home Medications on Admission:   Current Outpatient Medications:    BREO ELLIPTA 200-25 MCG/INH AEPB, USE 1 INHALATION BY MOUTH  DAILY, Disp: 180 each, Rfl: 3   Multiple Vitamins-Minerals (MULTIVITAMIN WITH MINERALS) tablet, Take 1 tablet by mouth daily., Disp: , Rfl:    Nintedanib (OFEV) 150 MG CAPS, TAKE 1 CAPSULE BY MOUTH  TWICE A DAY 12 HOURS APART  WITH FOOD, Disp: 60 capsule, Rfl: 10   Omega-3 Fatty Acids (FISH OIL) 1000 MG CAPS, Take 1 capsule by mouth daily., Disp: , Rfl:    omeprazole (PRILOSEC) 20 MG capsule, TAKE 1 CAPSULE BY MOUTH  DAILY, Disp: 90 capsule, Rfl: 3   XARELTO 20 MG TABS tablet, Take 20 mg by mouth daily., Disp: , Rfl: 2  Past Medical History: Past Medical History:  Diagnosis Date   DVT (deep venous thrombosis) (HCC)    Factor 5 Leiden mutation, heterozygous (HCC)    Interstitial lung disease (HCC)     Tobacco Use: Social History   Tobacco Use  Smoking Status Former   Packs/day: 0.25   Years: 44.00   Pack years: 11.00   Types: Cigars, Cigarettes   Start date: 24   Quit date: 03/29/2017   Years since quitting: 4.2  Smokeless Tobacco Never  Tobacco Comments   5-6 cigars daily     Labs: Recent Review Flowsheet Data   There is no flowsheet data to display.     Capillary Blood Glucose: Lab Results  Component Value Date   GLUCAP 96 08/09/2019     Pulmonary Assessment Scores:  Pulmonary Assessment Scores     Row Name 05/20/21 0940         ADL UCSD   ADL  Phase Entry     SOB Score total 66       CAT Score   CAT Score 12       mMRC Score   mMRC Score 4             UCSD: Self-administered rating of dyspnea associated with activities of daily living (ADLs) 6-point scale (0 = "not at all" to 5 = "maximal or unable to do because of breathlessness")  Scoring Scores range from 0 to 120.  Minimally important difference is 5 units  CAT: CAT can identify the health impairment of COPD patients and is better correlated with disease progression.  CAT has a scoring range of zero to 40. The CAT score is classified into four groups of low (less than 10), medium (10 - 20), high (21-30) and very high (31-40) based on the impact level of disease on health status. A CAT score over 10 suggests significant symptoms.  A worsening CAT score could be explained by an exacerbation, poor medication adherence, poor inhaler technique, or progression of COPD or comorbid conditions.  CAT MCID is 2 points  mMRC: mMRC (Modified Medical Research Council) Dyspnea Scale is used to assess the degree of baseline functional disability in patients of respiratory disease due to dyspnea. No minimal important difference is  established. A decrease in score of 1 point or greater is considered a positive change.   Pulmonary Function Assessment:  Pulmonary Function Assessment - 05/20/21 0940       Breath   Bilateral Breath Sounds Clear    Shortness of Breath Yes;Limiting activity             Exercise Target Goals: Exercise Program Goal: Individual exercise prescription set using results from initial 6 min walk test and THRR while considering  patient's activity barriers and safety.   Exercise Prescription Goal: Initial exercise prescription builds to 30-45 minutes a day of aerobic activity, 2-3 days per week.  Home exercise guidelines will be given to patient during program as part of exercise prescription that the participant will acknowledge.  Activity Barriers &  Risk Stratification:  Activity Barriers & Cardiac Risk Stratification - 05/20/21 0936       Activity Barriers & Cardiac Risk Stratification   Activity Barriers Deconditioning;Muscular Weakness;Shortness of Breath             6 Minute Walk:  6 Minute Walk     Row Name 05/20/21 1144         6 Minute Walk   Phase Initial     Distance 1256 feet     Walk Time 6 minutes     # of Rest Breaks 0     MPH 2.38     METS 3.18     RPE 11     Perceived Dyspnea  1     VO2 Peak 11.14     Symptoms No     Resting HR 81 bpm     Resting BP 112/68     Resting Oxygen Saturation  100 %     Exercise Oxygen Saturation  during 6 min walk 82 %     Max Ex. HR 120 bpm     Max Ex. BP 134/68     2 Minute Post BP 116/68       Interval HR   1 Minute HR 101     2 Minute HR 116     3 Minute HR 120     4 Minute HR 119     5 Minute HR 116     6 Minute HR 113     2 Minute Post HR 79     Interval Heart Rate? Yes       Interval Oxygen   Interval Oxygen? Yes     Baseline Oxygen Saturation % 100 %     1 Minute Oxygen Saturation % 96 %     1 Minute Liters of Oxygen 6 L     2 Minute Oxygen Saturation % 85 %     2 Minute Liters of Oxygen 6 L     3 Minute Oxygen Saturation % 82 %     3 Minute Liters of Oxygen 8 L     4 Minute Oxygen Saturation % 84 %     4 Minute Liters of Oxygen 10 L     5 Minute Oxygen Saturation % 89 %     5 Minute Liters of Oxygen 15 L     6 Minute Oxygen Saturation % 90 %     6 Minute Liters of Oxygen 15 L     2 Minute Post Oxygen Saturation % 98 %     2 Minute Post Liters of Oxygen 6 L  Oxygen Initial Assessment:  Oxygen Initial Assessment - 06/17/21 1009       Initial 6 min Walk   Oxygen Used Continuous    Liters per minute 15      Program Oxygen Prescription   Liters per minute 15   for rower and treadmill            Oxygen Re-Evaluation:  Oxygen Re-Evaluation     Row Name 05/20/21 0938 06/17/21 1009           Program Oxygen  Prescription   Program Oxygen Prescription Continuous Continuous      Liters per minute 15  15 L treadmill, will monitor for rower --        Home Oxygen   Home Oxygen Device -- Home Concentrator;Portable Concentrator;E-Tanks      Sleep Oxygen Prescription -- Continuous      Liters per minute -- 5      Home Exercise Oxygen Prescription -- Continuous      Liters per minute -- 15      Home Resting Oxygen Prescription -- None      Compliance with Home Oxygen Use -- Yes        Goals/Expected Outcomes   Short Term Goals To learn and exhibit compliance with exercise, home and travel O2 prescription;To learn and understand importance of monitoring SPO2 with pulse oximeter and demonstrate accurate use of the pulse oximeter.;To learn and understand importance of maintaining oxygen saturations>88%;To learn and demonstrate proper pursed lip breathing techniques or other breathing techniques. ;To learn and demonstrate proper use of respiratory medications To learn and exhibit compliance with exercise, home and travel O2 prescription;To learn and understand importance of monitoring SPO2 with pulse oximeter and demonstrate accurate use of the pulse oximeter.;To learn and understand importance of maintaining oxygen saturations>88%;To learn and demonstrate proper pursed lip breathing techniques or other breathing techniques. ;To learn and demonstrate proper use of respiratory medications      Long  Term Goals Verbalizes importance of monitoring SPO2 with pulse oximeter and return demonstration;Exhibits compliance with exercise, home  and travel O2 prescription;Maintenance of O2 saturations>88%;Exhibits proper breathing techniques, such as pursed lip breathing or other method taught during program session;Compliance with respiratory medication;Demonstrates proper use of MDI's Verbalizes importance of monitoring SPO2 with pulse oximeter and return demonstration;Exhibits compliance with exercise, home  and travel O2  prescription;Maintenance of O2 saturations>88%;Exhibits proper breathing techniques, such as pursed lip breathing or other method taught during program session;Compliance with respiratory medication;Demonstrates proper use of MDI's      Goals/Expected Outcomes Compliance and understanding of oxygen saturation monitoring and breathing techniques to decrease shortness of breath Compliance and understanding of oxygen saturation monitoring and breathing techniques to decrease shortness of breath               Oxygen Discharge (Final Oxygen Re-Evaluation):  Oxygen Re-Evaluation - 06/17/21 1009       Program Oxygen Prescription   Program Oxygen Prescription Continuous      Home Oxygen   Home Oxygen Device Home Concentrator;Portable Concentrator;E-Tanks    Sleep Oxygen Prescription Continuous    Liters per minute 5    Home Exercise Oxygen Prescription Continuous    Liters per minute 15    Home Resting Oxygen Prescription None    Compliance with Home Oxygen Use Yes      Goals/Expected Outcomes   Short Term Goals To learn and exhibit compliance with exercise, home and travel O2 prescription;To learn and understand importance of monitoring SPO2  with pulse oximeter and demonstrate accurate use of the pulse oximeter.;To learn and understand importance of maintaining oxygen saturations>88%;To learn and demonstrate proper pursed lip breathing techniques or other breathing techniques. ;To learn and demonstrate proper use of respiratory medications    Long  Term Goals Verbalizes importance of monitoring SPO2 with pulse oximeter and return demonstration;Exhibits compliance with exercise, home  and travel O2 prescription;Maintenance of O2 saturations>88%;Exhibits proper breathing techniques, such as pursed lip breathing or other method taught during program session;Compliance with respiratory medication;Demonstrates proper use of MDI's    Goals/Expected Outcomes Compliance and understanding of oxygen  saturation monitoring and breathing techniques to decrease shortness of breath             Initial Exercise Prescription:  Initial Exercise Prescription - 05/20/21 1200       Date of Initial Exercise RX and Referring Provider   Date 05/20/21    Referring Provider Everardo All    Expected Discharge Date 07/25/21      Oxygen   Oxygen Intermittent    Liters 15    Maintain Oxygen Saturation 88% or higher      Treadmill   MPH 2    Grade 0    Minutes 15      Rower   Level 1    Watts 30    Minutes 15      Prescription Details   Frequency (times per week) 2    Duration Progress to 30 minutes of continuous aerobic without signs/symptoms of physical distress      Intensity   THRR 40-80% of Max Heartrate 62-124    Ratings of Perceived Exertion 11-13    Perceived Dyspnea 0-4      Progression   Progression Continue to progress workloads to maintain intensity without signs/symptoms of physical distress.      Resistance Training   Training Prescription Yes    Weight blue bands    Reps 10-15             Perform Capillary Blood Glucose checks as needed.  Exercise Prescription Changes:   Exercise Prescription Changes     Row Name 06/04/21 1400 06/18/21 1400 06/18/21 1458         Response to Exercise   Blood Pressure (Admit) 130/80 -- 112/72     Blood Pressure (Exercise) 146/80 -- 138/72     Blood Pressure (Exit) 122/62 -- 110/62     Heart Rate (Admit) 78 bpm -- 82 bpm     Heart Rate (Exercise) 117 bpm -- 112 bpm     Heart Rate (Exit) 91 bpm -- 94 bpm     Oxygen Saturation (Admit) 94 % -- 100 %     Oxygen Saturation (Exercise) 89 % -- 90 %     Oxygen Saturation (Exit) 100 % -- 99 %     Rating of Perceived Exertion (Exercise) 13 -- 13     Perceived Dyspnea (Exercise) 2 -- 2     Duration -- -- Continue with 30 min of aerobic exercise without signs/symptoms of physical distress.     Intensity -- -- THRR unchanged       Progression   Progression -- -- Continue to  progress workloads to maintain intensity without signs/symptoms of physical distress.       Resistance Training   Training Prescription Yes -- Yes     Weight blue bands -- blue bands     Reps 10-15 -- 10-15     Time -- -- 10 Minutes  Oxygen   Oxygen Intermittent -- Continuous     Liters 15 -- 15       Treadmill   MPH 2 -- 2     Grade 0 -- 0     Minutes 15 -- 15     METs -- -- 2.53       Rower   Level 1 -- 2     Watts 18 -- 22     Minutes 15 -- 15       Oxygen   Maintain Oxygen Saturation 88% or higher -- --              Exercise Comments:   Exercise Comments     Row Name 05/28/21 1503           Exercise Comments Pt completed first day of exercise. He exercised on the treadmill at 2 mph for 15 min and rower at level 1 for 15 min. Pt tolerated both exercise modes fair and performed warm up and cool down exercises standing without limitations. His O2 saturation decreased to 87% 15L on the treadmill. Will try wall mount 15 L.                Exercise Goals and Review:   Exercise Goals     Row Name 05/20/21 1149 06/17/21 1005           Exercise Goals   Increase Physical Activity Yes Yes      Intervention Provide advice, education, support and counseling about physical activity/exercise needs.;Develop an individualized exercise prescription for aerobic and resistive training based on initial evaluation findings, risk stratification, comorbidities and participant's personal goals. Provide advice, education, support and counseling about physical activity/exercise needs.;Develop an individualized exercise prescription for aerobic and resistive training based on initial evaluation findings, risk stratification, comorbidities and participant's personal goals.      Expected Outcomes Short Term: Attend rehab on a regular basis to increase amount of physical activity.;Long Term: Add in home exercise to make exercise part of routine and to increase amount of physical  activity.;Long Term: Exercising regularly at least 3-5 days a week. Short Term: Attend rehab on a regular basis to increase amount of physical activity.;Long Term: Add in home exercise to make exercise part of routine and to increase amount of physical activity.;Long Term: Exercising regularly at least 3-5 days a week.      Increase Strength and Stamina Yes Yes      Intervention Provide advice, education, support and counseling about physical activity/exercise needs.;Develop an individualized exercise prescription for aerobic and resistive training based on initial evaluation findings, risk stratification, comorbidities and participant's personal goals. Provide advice, education, support and counseling about physical activity/exercise needs.;Develop an individualized exercise prescription for aerobic and resistive training based on initial evaluation findings, risk stratification, comorbidities and participant's personal goals.      Expected Outcomes Short Term: Increase workloads from initial exercise prescription for resistance, speed, and METs.;Short Term: Perform resistance training exercises routinely during rehab and add in resistance training at home;Long Term: Improve cardiorespiratory fitness, muscular endurance and strength as measured by increased METs and functional capacity ( ) Short Term: Increase workloads from initial exercise prescription for resistance, speed, and METs.;Short Term: Perform resistance training exercises routinely during rehab and add in resistance training at home;Long Term: Improve cardiorespiratory fitness, muscular endurance and strength as measured by increased METs and functional capacity ( )      Able to understand and use rate of perceived exertion (RPE) scale Yes Yes  Intervention Provide education and explanation on how to use RPE scale Provide education and explanation on how to use RPE scale      Expected Outcomes Short Term: Able to use RPE daily in rehab  to express subjective intensity level;Long Term:  Able to use RPE to guide intensity level when exercising independently Short Term: Able to use RPE daily in rehab to express subjective intensity level;Long Term:  Able to use RPE to guide intensity level when exercising independently      Able to understand and use Dyspnea scale Yes Yes      Intervention Provide education and explanation on how to use Dyspnea scale Provide education and explanation on how to use Dyspnea scale      Expected Outcomes Short Term: Able to use Dyspnea scale daily in rehab to express subjective sense of shortness of breath during exertion;Long Term: Able to use Dyspnea scale to guide intensity level when exercising independently Short Term: Able to use Dyspnea scale daily in rehab to express subjective sense of shortness of breath during exertion;Long Term: Able to use Dyspnea scale to guide intensity level when exercising independently      Knowledge and understanding of Target Heart Rate Range (THRR) Yes Yes      Intervention Provide education and explanation of THRR including how the numbers were predicted and where they are located for reference Provide education and explanation of THRR including how the numbers were predicted and where they are located for reference      Expected Outcomes Short Term: Able to state/look up THRR;Short Term: Able to use daily as guideline for intensity in rehab;Long Term: Able to use THRR to govern intensity when exercising independently Short Term: Able to state/look up THRR;Short Term: Able to use daily as guideline for intensity in rehab;Long Term: Able to use THRR to govern intensity when exercising independently      Understanding of Exercise Prescription Yes Yes      Intervention Provide education, explanation, and written materials on patient's individual exercise prescription Provide education, explanation, and written materials on patient's individual exercise prescription       Expected Outcomes Short Term: Able to explain program exercise prescription;Long Term: Able to explain home exercise prescription to exercise independently Short Term: Able to explain program exercise prescription;Long Term: Able to explain home exercise prescription to exercise independently               Exercise Goals Re-Evaluation :  Exercise Goals Re-Evaluation     Row Name 05/20/21 1152 06/17/21 1005           Exercise Goal Re-Evaluation   Exercise Goals Review Increase Physical Activity;Increase Strength and Stamina;Able to understand and use rate of perceived exertion (RPE) scale;Able to understand and use Dyspnea scale;Knowledge and understanding of Target Heart Rate Range (THRR);Understanding of Exercise Prescription Increase Physical Activity;Increase Strength and Stamina;Able to understand and use rate of perceived exertion (RPE) scale;Able to understand and use Dyspnea scale;Knowledge and understanding of Target Heart Rate Range (THRR);Understanding of Exercise Prescription      Comments Brent Holloway is scheduled to begin exercise next week. Will monitor and progress as able. Brent Holloway has completed 4 exercise sessions. He exercises on the rower and treadmill for 15 min each. He averages 22 watts at level 2 on the rower and 2.53 METs at 2.0 mph on the treadmill. He increased his workload on the rower and tolerated this progression well. He performs the warmup and cooldown standing without limitations. Brent Holloway is motivated  to exercise as he has lost weight in the past. Will continue to monitor and progress as able.      Expected Outcomes Through exercise at rehab and home, the patient will decrease shortness of breath with daily activities and feel confident in carrying out an exercise regimen at home Through exercise at rehab and home, the patient will decrease shortness of breath with daily activities and feel confident in carrying out and exercise regimen at home.               Discharge  Exercise Prescription (Final Exercise Prescription Changes):  Exercise Prescription Changes - 06/18/21 1458       Response to Exercise   Blood Pressure (Admit) 112/72    Blood Pressure (Exercise) 138/72    Blood Pressure (Exit) 110/62    Heart Rate (Admit) 82 bpm    Heart Rate (Exercise) 112 bpm    Heart Rate (Exit) 94 bpm    Oxygen Saturation (Admit) 100 %    Oxygen Saturation (Exercise) 90 %    Oxygen Saturation (Exit) 99 %    Rating of Perceived Exertion (Exercise) 13    Perceived Dyspnea (Exercise) 2    Duration Continue with 30 min of aerobic exercise without signs/symptoms of physical distress.    Intensity THRR unchanged      Progression   Progression Continue to progress workloads to maintain intensity without signs/symptoms of physical distress.      Resistance Training   Training Prescription Yes    Weight blue bands    Reps 10-15    Time 10 Minutes      Oxygen   Oxygen Continuous    Liters 15      Treadmill   MPH 2    Grade 0    Minutes 15    METs 2.53      Rower   Level 2    Watts 22    Minutes 15             Nutrition:  Target Goals: Understanding of nutrition guidelines, daily intake of sodium 1500mg , cholesterol 200mg , calories 30% from fat and 7% or less from saturated fats, daily to have 5 or more servings of fruits and vegetables.  Biometrics:  Pre Biometrics - 05/20/21 0936       Pre Biometrics   Grip Strength --              Nutrition Therapy Plan and Nutrition Goals:   Nutrition Assessments:  MEDIFICTS Score Key: ?70 Need to make dietary changes  40-70 Heart Healthy Diet ? 40 Therapeutic Level Cholesterol Diet   Picture Your Plate Scores: <56 Unhealthy dietary pattern with much room for improvement. 41-50 Dietary pattern unlikely to meet recommendations for good health and room for improvement. 51-60 More healthful dietary pattern, with some room for improvement.  >60 Healthy dietary pattern, although there may be  some specific behaviors that could be improved.    Nutrition Goals Re-Evaluation:   Nutrition Goals Discharge (Final Nutrition Goals Re-Evaluation):   Psychosocial: Target Goals: Acknowledge presence or absence of significant depression and/or stress, maximize coping skills, provide positive support system. Participant is able to verbalize types and ability to use techniques and skills needed for reducing stress and depression.  Initial Review & Psychosocial Screening:  Initial Psych Review & Screening - 05/20/21 1024       Initial Review   Current issues with None Identified      Family Dynamics   Good Support System?  Yes    Comments No barriers or psychosocial concerns identified at this time.      Barriers   Psychosocial barriers to participate in program There are no identifiable barriers or psychosocial needs.      Screening Interventions   Interventions Encouraged to exercise             Quality of Life Scores:  Scores of 19 and below usually indicate a poorer quality of life in these areas.  A difference of  2-3 points is a clinically meaningful difference.  A difference of 2-3 points in the total score of the Quality of Life Index has been associated with significant improvement in overall quality of life, self-image, physical symptoms, and general health in studies assessing change in quality of life.  PHQ-9: Recent Review Flowsheet Data     Depression screen [facility] 2/9 06/13/2019 06/13/2019   Decreased Interest 0 0   Down, Depressed, Hopeless 0 0   PHQ - 2 Score 0 0   Altered sleeping 0 -   Tired, decreased energy 0 -   Change in appetite 0 -   Feeling bad or failure about yourself  0 -   Trouble concentrating 0 -   Moving slowly or fidgety/restless 0 -   Suicidal thoughts 0 -   PHQ-9 Score 0 -   Difficult doing work/chores Not difficult at all -      Interpretation of Total Score  Total Score Depression Severity:  1-4 = Minimal depression, 5-9 =  Mild depression, 10-14 = Moderate depression, 15-19 = Moderately severe depression, 20-27 = Severe depression   Psychosocial Evaluation and Intervention:  Psychosocial Evaluation - 05/20/21 1024       Psychosocial Evaluation & Interventions   Interventions Encouraged to exercise with the program and follow exercise prescription    Comments No concerns identified    Expected Outcomes For Brent Holloway to continue to be free of psychosocial barriers or concerns while participating in pulmonary rehab.    Continue Psychosocial Services  No Follow up required             Psychosocial Re-Evaluation:  Psychosocial Re-Evaluation     Row Name 05/21/21 1528 06/18/21 1511           Psychosocial Re-Evaluation   Current issues with None Identified None Identified      Comments Brent Holloway has not started his exercise sessions. For Brent Holloway to continue to participate in PR without psychosocial barriers or issues.      Expected Outcomes That pt will continue to have no pshcyosocial barriers that will interfer with his participation in pulmonary rehab. Encourage to exercise.      Interventions Encouraged to attend Pulmonary Rehabilitation for the exercise Encouraged to attend Cardiac Rehabilitation for the exercise;Encouraged to attend Pulmonary Rehabilitation for the exercise      Continue Psychosocial Services  No Follow up required No Follow up required               Psychosocial Discharge (Final Psychosocial Re-Evaluation):  Psychosocial Re-Evaluation - 06/18/21 1511       Psychosocial Re-Evaluation   Current issues with None Identified    Comments For Brent Holloway to continue to participate in [facility] without psychosocial barriers or issues.    Expected Outcomes Encourage to exercise.    Interventions Encouraged to attend Cardiac Rehabilitation for the exercise;Encouraged to attend Pulmonary Rehabilitation for the exercise    Continue Psychosocial Services  No Follow up required  Education: Education Goals: Education classes will be provided on a weekly basis, covering required topics. Participant will state understanding/return demonstration of topics presented.  Learning Barriers/Preferences:   Education Topics: Risk Factor Reduction:  -Group instruction that is supported by a PowerPoint presentation. Instructor discusses the definition of a risk factor, different risk factors for pulmonary disease, and how the heart and lungs work together.   Flowsheet Row PULMONARY REHAB OTHER RESPIRATORY from 06/20/2021 in [facility] CARDIAC REHAB  Date 06/20/21  Educator handout/Kaylee       Nutrition for Pulmonary Patient:  -Group instruction provided by PowerPoint slides, verbal discussion, and written materials to support subject matter. The instructor gives an explanation and review of healthy diet recommendations, which includes a discussion on weight management, recommendations for fruit and vegetable consumption, as well as protein, fluid, caffeine, fiber, sodium, sugar, and alcohol. Tips for eating when patients are short of breath are discussed. Flowsheet Row PULMONARY REHAB OTHER RESPIRATORY from 10/27/2019 in [facility] CARDIAC REHAB  Date 10/25/19  Educator MD  Instruction Review Code 2- Demonstrated Understanding       Pursed Lip Breathing:  -Group instruction that is supported by demonstration and informational handouts. Instructor discusses the benefits of pursed lip and diaphragmatic breathing and detailed demonstration on how to preform both.     Oxygen Safety:  -Group instruction provided by PowerPoint, verbal discussion, and written material to support subject matter. There is an overview of "What is Oxygen" and "Why do we need it".  Instructor also reviews how to create a safe environment for oxygen use, the importance of using oxygen as prescribed, and the risks of noncompliance. There is a brief discussion on  traveling with oxygen and resources the patient may utilize.   Oxygen Equipment:  -Group instruction provided by [facility] Staff utilizing handouts, written materials, and equipment demonstrations. Flowsheet Row PULMONARY REHAB OTHER RESPIRATORY from 06/20/2021 in [facility] CARDIAC REHAB  Date 05/30/21  Educator Lisette Abu  [Handout]       Signs and Symptoms:  -Group instruction provided by written material and verbal discussion to support subject matter. Warning signs and symptoms of infection, stroke, and heart attack are reviewed and when to call the physician/911 reinforced. Tips for preventing the spread of infection discussed.   Advanced Directives:  -Group instruction provided by verbal instruction and written material to support subject matter. Instructor reviews Advanced Directive laws and proper instruction for filling out document.   Pulmonary Video:  -Group video education that reviews the importance of medication and oxygen compliance, exercise, good nutrition, pulmonary hygiene, and pursed lip and diaphragmatic breathing for the pulmonary patient.   Exercise for the Pulmonary Patient:  -Group instruction that is supported by a PowerPoint presentation. Instructor discusses benefits of exercise, core components of exercise, frequency, duration, and intensity of an exercise routine, importance of utilizing pulse oximetry during exercise, safety while exercising, and options of places to exercise outside of rehab.     Pulmonary Medications:  -Verbally interactive group education provided by instructor with focus on inhaled medications and proper administration.   Anatomy and Physiology of the Respiratory System and Intimacy:  -Group instruction provided by PowerPoint, verbal discussion, and written material to support subject matter. Instructor reviews respiratory cycle and anatomical components of the respiratory system and their functions. Instructor also  reviews differences in obstructive and restrictive respiratory diseases with examples of each. Intimacy, Sex, and Sexuality differences are reviewed with a discussion on how relationships can  change when diagnosed with pulmonary disease. Common sexual concerns are reviewed. Flowsheet Row PULMONARY REHAB OTHER RESPIRATORY from 10/27/2019 in [facility] CARDIAC REHAB  Date 09/29/19  Educator DF  Instruction Review Code 2- Demonstrated Understanding       MD DAY -A group question and answer session with a medical doctor that allows participants to ask questions that relate to their pulmonary disease state.   OTHER EDUCATION -Group or individual verbal, written, or video instructions that support the educational goals of the pulmonary rehab program. Flowsheet Row PULMONARY REHAB OTHER RESPIRATORY from 10/27/2019 in [facility] CARDIAC REHAB  Date 09/22/19  Educator DF  Ashley Mariner your numbers]  Instruction Review Code 2- Demonstrated Understanding       Holiday Eating Survival Tips:  -Group instruction provided by PowerPoint slides, verbal discussion, and written materials to support subject matter. The instructor gives patients tips, tricks, and techniques to help them not only survive but enjoy the holidays despite the onslaught of food that accompanies the holidays.   Knowledge Questionnaire Score:  Knowledge Questionnaire Score - 05/20/21 0957       Knowledge Questionnaire Score   Pre Score 16/18             Core Components/Risk Factors/Patient Goals at Admission:  Personal Goals and Risk Factors at Admission - 05/20/21 1026       Core Components/Risk Factors/Patient Goals on Admission   Improve shortness of breath with ADL's Yes    Intervention Provide education, individualized exercise plan and daily activity instruction to help decrease symptoms of SOB with activities of daily living.    Expected Outcomes Short Term: Improve cardiorespiratory  fitness to achieve a reduction of symptoms when performing ADLs;Long Term: Be able to perform more ADLs without symptoms or delay the onset of symptoms    Increase knowledge of respiratory medications and ability to use respiratory devices properly  Yes    Intervention Provide education and demonstration as needed of appropriate use of medications, inhalers, and oxygen therapy.    Expected Outcomes Short Term: Achieves understanding of medications use. Understands that oxygen is a medication prescribed by physician. Demonstrates appropriate use of inhaler and oxygen therapy.;Long Term: Maintain appropriate use of medications, inhalers, and oxygen therapy.             Core Components/Risk Factors/Patient Goals Review:   Goals and Risk Factor Review     Row Name 05/20/21 0959 05/21/21 1535 06/18/21 1519         Core Components/Risk Factors/Patient Goals Review   Personal Goals Review Increase knowledge of respiratory medications and ability to use respiratory devices properly.;Improve shortness of breath with ADL's;Develop more efficient breathing techniques such as purse lipped breathing and diaphragmatic breathing and practicing self-pacing with activity. Improve shortness of breath with ADL's;Develop more efficient breathing techniques such as purse lipped breathing and diaphragmatic breathing and practicing self-pacing with activity.;Increase knowledge of respiratory medications and ability to use respiratory devices properly. Improve shortness of breath with ADL's;Develop more efficient breathing techniques such as purse lipped breathing and diaphragmatic breathing and practicing self-pacing with activity.;Increase knowledge of respiratory medications and ability to use respiratory devices properly.     Review -- He will begin exercise sessions next week, to early to assess goals. Brent Holloway has progressed with his exercise even with only 4 sessions. He is very motivated to exercise and to increase  his work loads. He has required 15 liters of oxygen to maintain his saturations greater than 88%. Brent Holloway  is enjoying exercising at [facility].     Expected Outcomes -- See admission goals. See initial core components.              Core Components/Risk Factors/Patient Goals at Discharge (Final Review):   Goals and Risk Factor Review - 06/18/21 1519       Core Components/Risk Factors/Patient Goals Review   Personal Goals Review Improve shortness of breath with ADL's;Develop more efficient breathing techniques such as purse lipped breathing and diaphragmatic breathing and practicing self-pacing with activity.;Increase knowledge of respiratory medications and ability to use respiratory devices properly.    Review Brent Holloway has progressed with his exercise even with only 4 sessions. He is very motivated to exercise and to increase his work loads. He has required 15 liters of oxygen to maintain his saturations greater than 88%. Brent Holloway is enjoying exercising at [facility].    Expected Outcomes See initial core components.             ITP Comments:   Comments: ITP REVIEW Pt is making expected progress toward pulmonary rehab goals after completing 5 sessions. Recommend continued exercise, life style modification, education, and utilization of breathing techniques to increase stamina and strength and decrease shortness of breath with exertion. Dr. Mechele Collin is Medical Director for Pulmonary Rehab at [facility].

## 2021-06-27 ENCOUNTER — Other Ambulatory Visit: Payer: Self-pay

## 2021-06-27 ENCOUNTER — Encounter (HOSPITAL_COMMUNITY)
Admission: RE | Admit: 2021-06-27 | Discharge: 2021-06-27 | Disposition: A | Discharge status: Home / Self Care | Payer: Medicare Other | Source: Ambulatory Visit | Attending: Pulmonary Disease | Admitting: Pulmonary Disease

## 2021-06-27 DIAGNOSIS — J849 Interstitial pulmonary disease, unspecified: Secondary | ICD-10-CM | POA: Diagnosis not present

## 2021-06-27 NOTE — Progress Notes (Signed)
Daily Session Note  Patient Details  Name: Brent Holloway MRN: [id number] Date of Birth: 01-04-1956 Referring Provider:   April Manson Pulmonary Rehab Walk Test from 05/20/2021 in [place]  Referring Provider [name]       Encounter Date: 06/27/2021  Check In:  Session Check In - 06/27/21 1430       Check-In   Supervising physician immediately available to respond to emergencies [facility] immediately available    Physician(s) Dr. Broadus John    Location [facility]    Staff Present Rodney Langton, Cathleen Fears, MS, ACSM-CEP, Exercise Physiologist;Annedrea Rosezella [place], RN, [facility]    Virtual Visit No    Medication changes reported     No    Fall or balance concerns reported    No    Tobacco Cessation No Change    Warm-up and Cool-down Performed as group-led instruction    Resistance Training Performed Yes    VAD Patient? No    PAD/SET Patient? No      Pain Assessment   Currently in Pain? No/denies    Multiple Pain Sites No             Capillary Blood Glucose: No results found for this or any previous visit (from the past 24 hour(s)).    Social History   Tobacco Use  Smoking Status Former   Packs/day: 0.25   Years: 44.00   Pack years: 11.00   Types: Cigars, Cigarettes   Start date: 9   Quit date: 03/29/2017   Years since quitting: 4.2  Smokeless Tobacco Never  Tobacco Comments   5-6 cigars daily     Goals Met:  Exercise tolerated well No report of concerns or symptoms today Strength training completed today  Goals Unmet:  Not Applicable  Comments: Service time is from 1321 to 1430    Dr. Rodman Pickle is Medical Director for Pulmonary Rehab at [facility].

## 2021-07-01 ENCOUNTER — Telehealth: Payer: Self-pay | Admitting: Pulmonary Disease

## 2021-07-01 DIAGNOSIS — Z13820 Encounter for screening for osteoporosis: Secondary | ICD-10-CM

## 2021-07-01 DIAGNOSIS — Z7682 Awaiting organ transplant status: Secondary | ICD-10-CM

## 2021-07-01 DIAGNOSIS — R918 Other nonspecific abnormal finding of lung field: Secondary | ICD-10-CM

## 2021-07-01 DIAGNOSIS — J84112 Idiopathic pulmonary fibrosis: Secondary | ICD-10-CM

## 2021-07-01 DIAGNOSIS — Z5181 Encounter for therapeutic drug level monitoring: Secondary | ICD-10-CM

## 2021-07-01 NOTE — Telephone Encounter (Signed)
Call made to patient wife, confirmed DOB. Wife states patient is in the lung transplant program at [facility] and they are requesting a Bone Density test. Requesting order be sent to [facility].   JE please advise if we can place order for Bone Density test. Thanks :)

## 2021-07-02 ENCOUNTER — Other Ambulatory Visit: Payer: Self-pay

## 2021-07-02 ENCOUNTER — Encounter (HOSPITAL_COMMUNITY)
Admission: RE | Admit: 2021-07-02 | Discharge: 2021-07-02 | Disposition: A | Discharge status: Home / Self Care | Payer: Medicare Other | Source: Ambulatory Visit | Attending: Pulmonary Disease | Admitting: Pulmonary Disease

## 2021-07-02 VITALS — Wt 220.9 lb

## 2021-07-02 DIAGNOSIS — J849 Interstitial pulmonary disease, unspecified: Secondary | ICD-10-CM

## 2021-07-02 NOTE — Telephone Encounter (Signed)
Ok to order bone density  

## 2021-07-02 NOTE — Telephone Encounter (Signed)
Order placed and spouse made aware

## 2021-07-02 NOTE — Progress Notes (Signed)
Daily Session Note  Patient Details  Name: Gregori Abril MRN: [id number] Date of Birth: May 30, 1956 Referring Provider:   April Manson Pulmonary Rehab Walk Test from 05/20/2021 in [place]  Referring Provider [name]       Encounter Date: 07/02/2021  Check In:  Session Check In - 07/02/21 1416       Check-In   Supervising physician immediately available to respond to emergencies [facility] immediately available    Physician(s) Dr. Broadus John    Location [facility]    Staff Present Rodney Langton, Cathleen Fears, MS, ACSM-CEP, Exercise Physiologist    Virtual Visit No    Medication changes reported     No    Fall or balance concerns reported    No    Tobacco Cessation No Change    Warm-up and Cool-down Performed as group-led instruction    Resistance Training Performed Yes    VAD Patient? No    PAD/SET Patient? No      Pain Assessment   Currently in Pain? No/denies    Multiple Pain Sites No             Capillary Blood Glucose: No results found for this or any previous visit (from the past 24 hour(s)).   Exercise Prescription Changes - 07/02/21 1500       Response to Exercise   Blood Pressure (Admit) 110/60    Blood Pressure (Exercise) 106/70    Blood Pressure (Exit) 98/62    Heart Rate (Admit) 76 bpm    Heart Rate (Exercise) 100 bpm    Heart Rate (Exit) 98 bpm    Oxygen Saturation (Admit) 100 %    Oxygen Saturation (Exercise) 94 %    Oxygen Saturation (Exit) 99 %    Rating of Perceived Exertion (Exercise) 11    Perceived Dyspnea (Exercise) 2    Duration Continue with 30 min of aerobic exercise without signs/symptoms of physical distress.    Intensity THRR unchanged      Progression   Progression Continue to progress workloads to maintain intensity without signs/symptoms of physical distress.      Resistance Training   Training Prescription Yes    Weight blue bands    Reps 10-15    Time 10 Minutes       Oxygen   Oxygen Continuous    Liters 15      Treadmill   MPH 2    Grade 0    Minutes 15    METs 2.53      Rower   Level 3    Watts 24    Minutes 15      Oxygen   Maintain Oxygen Saturation 88% or higher             Social History   Tobacco Use  Smoking Status Former   Packs/day: 0.25   Years: 44.00   Pack years: 11.00   Types: Cigars, Cigarettes   Start date: 53   Quit date: 03/29/2017   Years since quitting: 4.2  Smokeless Tobacco Never  Tobacco Comments   5-6 cigars daily     Goals Met:  Independence with exercise equipment Exercise tolerated well No report of concerns or symptoms today Strength training completed today  Goals Unmet:  Not Applicable  Comments: Service time is from 1325 to 1440.    Dr. Rodman Pickle is Medical Director for Pulmonary Rehab at [facility].

## 2021-07-04 ENCOUNTER — Ambulatory Visit (INDEPENDENT_AMBULATORY_CARE_PROVIDER_SITE_OTHER)
Admission: RE | Admit: 2021-07-04 | Discharge: 2021-07-04 | Disposition: A | Discharge status: Home / Self Care | Payer: Medicare Other | Source: Ambulatory Visit | Attending: Family Medicine | Admitting: Family Medicine

## 2021-07-04 ENCOUNTER — Other Ambulatory Visit: Payer: Self-pay

## 2021-07-04 ENCOUNTER — Encounter (HOSPITAL_COMMUNITY)
Admission: RE | Admit: 2021-07-04 | Discharge: 2021-07-04 | Disposition: A | Discharge status: Home / Self Care | Payer: Medicare Other | Source: Ambulatory Visit | Attending: Pulmonary Disease | Admitting: Pulmonary Disease

## 2021-07-04 DIAGNOSIS — R918 Other nonspecific abnormal finding of lung field: Secondary | ICD-10-CM

## 2021-07-04 DIAGNOSIS — Z5181 Encounter for therapeutic drug level monitoring: Secondary | ICD-10-CM

## 2021-07-04 DIAGNOSIS — Z13820 Encounter for screening for osteoporosis: Secondary | ICD-10-CM

## 2021-07-04 DIAGNOSIS — Z7682 Awaiting organ transplant status: Secondary | ICD-10-CM

## 2021-07-04 DIAGNOSIS — J849 Interstitial pulmonary disease, unspecified: Secondary | ICD-10-CM

## 2021-07-04 DIAGNOSIS — J84112 Idiopathic pulmonary fibrosis: Secondary | ICD-10-CM

## 2021-07-04 NOTE — Progress Notes (Signed)
Home Exercise Prescription I have reviewed a Home Exercise Prescription with Brent Holloway is currently exercising at home. Brent Holloway walks 2 non-rehab days/wk for 20 min/day. The patient was advised to walk 3 non-rehab days/wk for 20-30 min/day post rehab. Brent Holloway and I discussed how to progress their exercise prescription. Brent Holloway stated that he is trying to get on the lung transplant list at [facility]. He also mentioned that he may have to participate in Pulmonary Rehab at [facility] to qualify for a lung transplant. He has used and is continuing to use Weight [employer] to monitor his weight. I am confident in Brent Holloway to carry out a safe exercise regimen at home. Brent Holloway stated that they understand the exercise prescription. The patient stated that their goals were to get a lung transplant. We reviewed exercise guidelines, target heart rate during exercise, RPE Scale, weather conditions, endpoints for exercise, warmup and cool down. The patient is encouraged to come to me with any questions. I will continue to follow up with the patient to assist them with progression and safety.    Joya San, MS, ACSM-CEP 07/04/2021 2:54 PM

## 2021-07-04 NOTE — Progress Notes (Signed)
Daily Session Note  Patient Details  Name: Brent Holloway MRN: [id number] Date of Birth: 07-20-1956 Referring Provider:   April Manson Pulmonary Rehab Walk Test from 05/20/2021 in [place]  Referring Provider [name]       Encounter Date: 07/04/2021  Check In:  Session Check In - 07/04/21 1417       Check-In   Supervising physician immediately available to respond to emergencies [facility] immediately available    Physician(s) Dr. Maren Beach    [facility]    Staff Present Rodney Langton, Cathleen Fears, MS, ACSM-CEP, Exercise Physiologist    Virtual Visit No    Medication changes reported     No    Fall or balance concerns reported    No    Tobacco Cessation No Change    Warm-up and Cool-down Performed as group-led instruction    Resistance Training Performed Yes    VAD Patient? No    PAD/SET Patient? No      Pain Assessment   Currently in Pain? No/denies    Multiple Pain Sites No             Capillary Blood Glucose: No results found for this or any previous visit (from the past 24 hour(s)).    Social History   Tobacco Use  Smoking Status Former   Packs/day: 0.25   Years: 44.00   Pack years: 11.00   Types: Cigars, Cigarettes   Start date: 80   Quit date: 03/29/2017   Years since quitting: 4.2  Smokeless Tobacco Never  Tobacco Comments   5-6 cigars daily     Goals Met:  Exercise tolerated well No report of concerns or symptoms today Strength training completed today  Goals Unmet:  Not Applicable  Comments: Service time is from 1320 to [place]    Dr. Rodman Pickle is Medical Director for Pulmonary Rehab at [facility].

## 2021-07-09 ENCOUNTER — Encounter (HOSPITAL_COMMUNITY): Payer: Medicare Other

## 2021-07-11 ENCOUNTER — Encounter (HOSPITAL_COMMUNITY)
Admission: RE | Admit: 2021-07-11 | Discharge: 2021-07-11 | Disposition: A | Discharge status: Home / Self Care | Payer: Medicare Other | Source: Ambulatory Visit | Attending: Pulmonary Disease | Admitting: Pulmonary Disease

## 2021-07-11 ENCOUNTER — Other Ambulatory Visit: Payer: Self-pay

## 2021-07-11 DIAGNOSIS — J849 Interstitial pulmonary disease, unspecified: Secondary | ICD-10-CM

## 2021-07-11 NOTE — Progress Notes (Signed)
Daily Session Note  Patient Details  Name: Aloys Hupfer MRN: [id number] Date of Birth: [birth year] Referring Provider:   April Manson Pulmonary Rehab Walk Test from 05/20/2021 in [place]  Referring Provider [name]       Encounter Date: 07/11/2021  Check In:  Session Check In - 07/11/21 1413       Check-In   Supervising physician immediately available to respond to emergencies [facility] immediately available    Physician(s) Dr. Vernell Barrier    [facility]    Staff Present Rosebud Poles, RN, Quentin Ore, MS, ACSM-CEP, Exercise Physiologist;Tayvin Preslar Ysidro Evert, RN    Virtual Visit No    Medication changes reported     No    Fall or balance concerns reported    No    Tobacco Cessation No Change    Warm-up and Cool-down Performed as group-led instruction    Resistance Training Performed Yes    VAD Patient? No    PAD/SET Patient? No      Pain Assessment   Currently in Pain? No/denies    Multiple Pain Sites No             Capillary Blood Glucose: No results found for this or any previous visit (from the past 24 hour(s)).    Social History   Tobacco Use  Smoking Status Former   Packs/day: 0.25   Years: 44.00   Pack years: 11.00   Types: Cigars, Cigarettes   Start date: 39   Quit date: 03/29/2017   Years since quitting: 4.2  Smokeless Tobacco Never  Tobacco Comments   5-6 cigars daily     Goals Met:  Exercise tolerated well No report of concerns or symptoms today Strength training completed today  Goals Unmet:  Not Applicable  Comments: Service time is from 1326 to [place]    Dr. Rodman Pickle is Medical Director for Pulmonary Rehab at [facility].

## 2021-07-16 ENCOUNTER — Encounter (HOSPITAL_COMMUNITY)
Admission: RE | Admit: 2021-07-16 | Discharge: 2021-07-16 | Disposition: A | Discharge status: Home / Self Care | Payer: Medicare Other | Source: Ambulatory Visit | Attending: Pulmonary Disease | Admitting: Pulmonary Disease

## 2021-07-16 ENCOUNTER — Other Ambulatory Visit: Payer: Self-pay

## 2021-07-16 VITALS — Wt 225.5 lb

## 2021-07-16 DIAGNOSIS — J849 Interstitial pulmonary disease, unspecified: Secondary | ICD-10-CM | POA: Diagnosis not present

## 2021-07-18 ENCOUNTER — Encounter (HOSPITAL_COMMUNITY)
Admission: RE | Admit: 2021-07-18 | Discharge: 2021-07-18 | Disposition: A | Discharge status: Home / Self Care | Payer: Medicare Other | Source: Ambulatory Visit | Attending: Pulmonary Disease | Admitting: Pulmonary Disease

## 2021-07-18 ENCOUNTER — Other Ambulatory Visit: Payer: Self-pay

## 2021-07-18 DIAGNOSIS — J849 Interstitial pulmonary disease, unspecified: Secondary | ICD-10-CM | POA: Diagnosis not present

## 2021-07-18 NOTE — Progress Notes (Signed)
Daily Session Note  Patient Details  Name: Brent Holloway MRN: [id number] Date of Birth: [birth year] Referring Provider:   April Manson Pulmonary Rehab Walk Test from 05/20/2021 in [place]  Referring Provider [name]       Encounter Date: 07/18/2021  Check In:  Session Check In - 07/18/21 1435       Check-In   Supervising physician immediately available to respond to emergencies [facility] immediately available    Physician(s) Dr. Sloan Leiter    [facility]    Staff Present Esmeralda Links BS, ACSM EP-C, Exercise Physiologist;Magdalyn Arenivas Clarisa Schools, MS, ACSM-CEP, Exercise Physiologist;David Lilyan Punt, MS, ACSM-CEP, CCRP, Exercise Physiologist    Virtual Visit No    Medication changes reported     No    Fall or balance concerns reported    No    Tobacco Cessation No Change    Warm-up and Cool-down Performed as group-led instruction    Resistance Training Performed Yes    VAD Patient? No    PAD/SET Patient? No      Pain Assessment   Currently in Pain? No/denies    Multiple Pain Sites No             Capillary Blood Glucose: No results found for this or any previous visit (from the past 24 hour(s)).    Social History   Tobacco Use  Smoking Status Former   Packs/day: 0.25   Years: 44.00   Pack years: 11.00   Types: Cigars, Cigarettes   Start date: 1974   Quit date: 03/29/2017   Years since quitting: 4.3  Smokeless Tobacco Never  Tobacco Comments   5-6 cigars daily     Goals Met:  No report of concerns or symptoms today Strength training completed today  Goals Unmet:  Not Applicable  Comments: Service time is from 1310 to [place] required 25 liters of oxygen on his walk test at [facility], so today we used 25 liters of oxygen on the treadmill today.   Dr. Rodman Pickle is Medical Director for Pulmonary Rehab at [facility].

## 2021-07-23 ENCOUNTER — Other Ambulatory Visit: Payer: Self-pay

## 2021-07-23 ENCOUNTER — Encounter (HOSPITAL_COMMUNITY)
Admission: RE | Admit: 2021-07-23 | Discharge: 2021-07-23 | Disposition: A | Discharge status: Home / Self Care | Payer: Medicare Other | Source: Ambulatory Visit | Attending: Pulmonary Disease | Admitting: Pulmonary Disease

## 2021-07-23 ENCOUNTER — Telehealth (HOSPITAL_COMMUNITY): Payer: Self-pay

## 2021-07-23 VITALS — Wt 225.3 lb

## 2021-07-23 DIAGNOSIS — J849 Interstitial pulmonary disease, unspecified: Secondary | ICD-10-CM | POA: Diagnosis present

## 2021-07-23 NOTE — Telephone Encounter (Signed)
Called Brent Holloway to see if you could come in earlier to complete his 6 MWT. He agreed.

## 2021-07-23 NOTE — Progress Notes (Signed)
Daily Session Note  Patient Details  Name: Brent Holloway MRN: [id number] Date of Birth: 1956-05-24 Referring Provider:   April Manson Pulmonary Rehab Walk Test from 05/20/2021 in [place]  Referring Provider [name]       Encounter Date: 07/23/2021  Check In:  Session Check In - 07/23/21 1418       Check-In   Supervising physician immediately available to respond to emergencies [facility] immediately available    Physician(s) Dr. Arbutus Ped    Location [facility]    Staff Present Rosebud Poles, RN, Quentin Ore, MS, ACSM-CEP, Exercise Physiologist;Lisa Ysidro Evert, RN    Virtual Visit No    Medication changes reported     No    Fall or balance concerns reported    No    Tobacco Cessation No Change    Warm-up and Cool-down Performed as group-led instruction    Resistance Training Performed Yes    VAD Patient? No    PAD/SET Patient? No      Pain Assessment   Currently in Pain? No/denies    Multiple Pain Sites No             Capillary Blood Glucose: No results found for this or any previous visit (from the past 24 hour(s)).    Social History   Tobacco Use  Smoking Status Former   Packs/day: 0.25   Years: 44.00   Pack years: 11.00   Types: Cigars, Cigarettes   Start date: 25   Quit date: 03/29/2017   Years since quitting: 4.3  Smokeless Tobacco Never  Tobacco Comments   5-6 cigars daily     Goals Met:  Proper associated with RPD/PD & O2 Sat Exercise tolerated well No report of concerns or symptoms today Strength training completed today  Goals Unmet:  Not Applicable  Comments: Service time is from 1315 to  1430.   Dr. Rodman Pickle is Medical Director for Pulmonary Rehab at [facility]

## 2021-07-24 NOTE — Progress Notes (Signed)
Pulmonary Individual Treatment Plan  Patient Details  Name: Brent Holloway MRN: [id number] Date of Birth: Dec 16, 1955 Referring Provider:   Doristine Devoid Pulmonary Rehab Walk Test from 05/20/2021 in [facility]  Referring Provider Everardo All       Initial Encounter Date:  Flowsheet Row Pulmonary Rehab Walk Test from 05/20/2021 in [facility] CARDIAC REHAB  Date 05/20/21       Visit Diagnosis: Interstitial lung disease (HCC)  Patient's Home Medications on Admission:   Current Outpatient Medications:    BREO ELLIPTA 200-25 MCG/INH AEPB, USE 1 INHALATION BY MOUTH  DAILY, Disp: 180 each, Rfl: 3   Multiple Vitamins-Minerals (MULTIVITAMIN WITH MINERALS) tablet, Take 1 tablet by mouth daily., Disp: , Rfl:    Nintedanib (OFEV) 150 MG CAPS, TAKE 1 CAPSULE BY MOUTH  TWICE A DAY 12 HOURS APART  WITH FOOD, Disp: 60 capsule, Rfl: 10   Omega-3 Fatty Acids (FISH OIL) 1000 MG CAPS, Take 1 capsule by mouth daily., Disp: , Rfl:    omeprazole (PRILOSEC) 20 MG capsule, TAKE 1 CAPSULE BY MOUTH  DAILY, Disp: 90 capsule, Rfl: 3   XARELTO 20 MG TABS tablet, Take 20 mg by mouth daily., Disp: , Rfl: 2  Past Medical History: Past Medical History:  Diagnosis Date   DVT (deep venous thrombosis) (HCC)    Factor 5 Leiden mutation, heterozygous (HCC)    Interstitial lung disease (HCC)     Tobacco Use: Social History   Tobacco Use  Smoking Status Former   Packs/day: 0.25   Years: 44.00   Pack years: 11.00   Types: Cigars, Cigarettes   Start date: 104   Quit date: 03/29/2017   Years since quitting: 4.3  Smokeless Tobacco Never  Tobacco Comments   5-6 cigars daily     Labs: Recent Review Flowsheet Data   There is no flowsheet data to display.     Capillary Blood Glucose: Lab Results  Component Value Date   GLUCAP 96 08/09/2019     Pulmonary Assessment Scores:  Pulmonary Assessment Scores     Row Name 05/20/21 0940         ADL UCSD   ADL  Phase Entry     SOB Score total 66       CAT Score   CAT Score 12       mMRC Score   mMRC Score 4             UCSD: Self-administered rating of dyspnea associated with activities of daily living (ADLs) 6-point scale (0 = "not at all" to 5 = "maximal or unable to do because of breathlessness")  Scoring Scores range from 0 to 120.  Minimally important difference is 5 units  CAT: CAT can identify the health impairment of COPD patients and is better correlated with disease progression.  CAT has a scoring range of zero to 40. The CAT score is classified into four groups of low (less than 10), medium (10 - 20), high (21-30) and very high (31-40) based on the impact level of disease on health status. A CAT score over 10 suggests significant symptoms.  A worsening CAT score could be explained by an exacerbation, poor medication adherence, poor inhaler technique, or progression of COPD or comorbid conditions.  CAT MCID is 2 points  mMRC: mMRC (Modified Medical Research Council) Dyspnea Scale is used to assess the degree of baseline functional disability in patients of respiratory disease due to dyspnea. No minimal important difference is  established. A decrease in score of 1 point or greater is considered a positive change.   Pulmonary Function Assessment:  Pulmonary Function Assessment - 05/20/21 0940       Breath   Bilateral Breath Sounds Clear    Shortness of Breath Yes;Limiting activity             Exercise Target Goals: Exercise Program Goal: Individual exercise prescription set using results from initial 6 min walk test and THRR while considering  patients activity barriers and safety.   Exercise Prescription Goal: Initial exercise prescription builds to 30-45 minutes a day of aerobic activity, 2-3 days per week.  Home exercise guidelines will be given to patient during program as part of exercise prescription that the participant will acknowledge.  Activity Barriers &  Risk Stratification:  Activity Barriers & Cardiac Risk Stratification - 05/20/21 0936       Activity Barriers & Cardiac Risk Stratification   Activity Barriers Deconditioning;Muscular Weakness;Shortness of Breath             6 Minute Walk:  6 Minute Walk     Row Name 05/20/21 1144         6 Minute Walk   Phase Initial     Distance 1256 feet     Walk Time 6 minutes     # of Rest Breaks 0     MPH 2.38     METS 3.18     RPE 11     Perceived Dyspnea  1     VO2 Peak 11.14     Symptoms No     Resting HR 81 bpm     Resting BP 112/68     Resting Oxygen Saturation  100 %     Exercise Oxygen Saturation  during 6 min walk 82 %     Max Ex. HR 120 bpm     Max Ex. BP 134/68     2 Minute Post BP 116/68       Interval HR   1 Minute HR 101     2 Minute HR 116     3 Minute HR 120     4 Minute HR 119     5 Minute HR 116     6 Minute HR 113     2 Minute Post HR 79     Interval Heart Rate? Yes       Interval Oxygen   Interval Oxygen? Yes     Baseline Oxygen Saturation % 100 %     1 Minute Oxygen Saturation % 96 %     1 Minute Liters of Oxygen 6 L     2 Minute Oxygen Saturation % 85 %     2 Minute Liters of Oxygen 6 L     3 Minute Oxygen Saturation % 82 %     3 Minute Liters of Oxygen 8 L     4 Minute Oxygen Saturation % 84 %     4 Minute Liters of Oxygen 10 L     5 Minute Oxygen Saturation % 89 %     5 Minute Liters of Oxygen 15 L     6 Minute Oxygen Saturation % 90 %     6 Minute Liters of Oxygen 15 L     2 Minute Post Oxygen Saturation % 98 %     2 Minute Post Liters of Oxygen 6 L  Oxygen Initial Assessment:  Oxygen Initial Assessment - 06/17/21 1009       Initial 6 min Walk   Oxygen Used Continuous    Liters per minute 15      Program Oxygen Prescription   Liters per minute 15   for rower and treadmill            Oxygen Re-Evaluation:  Oxygen Re-Evaluation     Row Name 05/20/21 4098 06/17/21 1009 07/17/21 0919         Program  Oxygen Prescription   Program Oxygen Prescription Continuous Continuous Continuous     Liters per minute 15  15 L treadmill, will monitor for rower -- 15  for rower and treadmill       Home Oxygen   Home Oxygen Device -- Home Concentrator;Portable Concentrator;E-Tanks Home Concentrator;Portable Concentrator;E-Tanks     Sleep Oxygen Prescription -- Continuous Continuous     Liters per minute -- 5 5     Home Exercise Oxygen Prescription -- Continuous Continuous     Liters per minute -- 15 15     Home Resting Oxygen Prescription -- None None     Compliance with Home Oxygen Use -- Yes Yes       Goals/Expected Outcomes   Short Term Goals To learn and exhibit compliance with exercise, home and travel O2 prescription;To learn and understand importance of monitoring SPO2 with pulse oximeter and demonstrate accurate use of the pulse oximeter.;To learn and understand importance of maintaining oxygen saturations>88%;To learn and demonstrate proper pursed lip breathing techniques or other breathing techniques. ;To learn and demonstrate proper use of respiratory medications To learn and exhibit compliance with exercise, home and travel O2 prescription;To learn and understand importance of monitoring SPO2 with pulse oximeter and demonstrate accurate use of the pulse oximeter.;To learn and understand importance of maintaining oxygen saturations>88%;To learn and demonstrate proper pursed lip breathing techniques or other breathing techniques. ;To learn and demonstrate proper use of respiratory medications To learn and exhibit compliance with exercise, home and travel O2 prescription;To learn and understand importance of monitoring SPO2 with pulse oximeter and demonstrate accurate use of the pulse oximeter.;To learn and understand importance of maintaining oxygen saturations>88%;To learn and demonstrate proper pursed lip breathing techniques or other breathing techniques. ;To learn and demonstrate proper use of  respiratory medications     Long  Term Goals Verbalizes importance of monitoring SPO2 with pulse oximeter and return demonstration;Exhibits compliance with exercise, home  and travel O2 prescription;Maintenance of O2 saturations>88%;Exhibits proper breathing techniques, such as pursed lip breathing or other method taught during program session;Compliance with respiratory medication;Demonstrates proper use of MDIs Verbalizes importance of monitoring SPO2 with pulse oximeter and return demonstration;Exhibits compliance with exercise, home  and travel O2 prescription;Maintenance of O2 saturations>88%;Exhibits proper breathing techniques, such as pursed lip breathing or other method taught during program session;Compliance with respiratory medication;Demonstrates proper use of MDIs Verbalizes importance of monitoring SPO2 with pulse oximeter and return demonstration;Exhibits compliance with exercise, home  and travel O2 prescription;Maintenance of O2 saturations>88%;Exhibits proper breathing techniques, such as pursed lip breathing or other method taught during program session;Compliance with respiratory medication;Demonstrates proper use of MDIs     Goals/Expected Outcomes Compliance and understanding of oxygen saturation monitoring and breathing techniques to decrease shortness of breath Compliance and understanding of oxygen saturation monitoring and breathing techniques to decrease shortness of breath Compliance and understanding of oxygen saturation monitoring and breathing techniques to decrease shortness of breath  Oxygen Discharge (Final Oxygen Re-Evaluation):  Oxygen Re-Evaluation - 07/17/21 0919       Program Oxygen Prescription   Program Oxygen Prescription Continuous    Liters per minute 15   for rower and treadmill     Home Oxygen   Home Oxygen Device Home Concentrator;Portable Concentrator;E-Tanks    Sleep Oxygen Prescription Continuous    Liters per minute 5    Home  Exercise Oxygen Prescription Continuous    Liters per minute 15    Home Resting Oxygen Prescription None    Compliance with Home Oxygen Use Yes      Goals/Expected Outcomes   Short Term Goals To learn and exhibit compliance with exercise, home and travel O2 prescription;To learn and understand importance of monitoring SPO2 with pulse oximeter and demonstrate accurate use of the pulse oximeter.;To learn and understand importance of maintaining oxygen saturations>88%;To learn and demonstrate proper pursed lip breathing techniques or other breathing techniques. ;To learn and demonstrate proper use of respiratory medications    Long  Term Goals Verbalizes importance of monitoring SPO2 with pulse oximeter and return demonstration;Exhibits compliance with exercise, home  and travel O2 prescription;Maintenance of O2 saturations>88%;Exhibits proper breathing techniques, such as pursed lip breathing or other method taught during program session;Compliance with respiratory medication;Demonstrates proper use of MDIs    Goals/Expected Outcomes Compliance and understanding of oxygen saturation monitoring and breathing techniques to decrease shortness of breath             Initial Exercise Prescription:  Initial Exercise Prescription - 05/20/21 1200       Date of Initial Exercise RX and Referring Provider   Date 05/20/21    Referring Provider Everardo All    Expected Discharge Date 07/25/21      Oxygen   Oxygen Intermittent    Liters 15    Maintain Oxygen Saturation 88% or higher      Treadmill   MPH 2    Grade 0    Minutes 15      Rower   Level 1    Watts 30    Minutes 15      Prescription Details   Frequency (times per week) 2    Duration Progress to 30 minutes of continuous aerobic without signs/symptoms of physical distress      Intensity   THRR 40-80% of Max Heartrate 62-124    Ratings of Perceived Exertion 11-13    Perceived Dyspnea 0-4      Progression   Progression Continue to  progress workloads to maintain intensity without signs/symptoms of physical distress.      Resistance Training   Training Prescription Yes    Weight blue bands    Reps 10-15             Perform Capillary Blood Glucose checks as needed.  Exercise Prescription Changes:   Exercise Prescription Changes     Row Name 06/04/21 1400 06/18/21 1400 06/18/21 1458 07/02/21 1500 07/16/21 1500     Response to Exercise   Blood Pressure (Admit) 130/80 -- 112/72 110/60 112/64   Blood Pressure (Exercise) 146/80 -- 138/72 106/70 126/74   Blood Pressure (Exit) 122/62 -- 110/62 98/62 102/62   Heart Rate (Admit) 78 bpm -- 82 bpm 76 bpm 82 bpm   Heart Rate (Exercise) 117 bpm -- 112 bpm 100 bpm 115 bpm   Heart Rate (Exit) 91 bpm -- 94 bpm 98 bpm 86 bpm   Oxygen Saturation (Admit) 94 % -- 100 % 100 % 100 %  Oxygen Saturation (Exercise) 89 % -- 90 % 94 % 90 %   Oxygen Saturation (Exit) 100 % -- 99 % 99 % 100 %   Rating of Perceived Exertion (Exercise) 13 -- 13 11 15    Perceived Dyspnea (Exercise) 2 -- 2 2 3    Duration -- -- Continue with 30 min of aerobic exercise without signs/symptoms of physical distress. Continue with 30 min of aerobic exercise without signs/symptoms of physical distress. Continue with 30 min of aerobic exercise without signs/symptoms of physical distress.   Intensity -- -- THRR unchanged THRR unchanged THRR unchanged     Progression   Progression -- -- Continue to progress workloads to maintain intensity without signs/symptoms of physical distress. Continue to progress workloads to maintain intensity without signs/symptoms of physical distress. Continue to progress workloads to maintain intensity without signs/symptoms of physical distress.     Resistance Training   Training Prescription Yes -- Yes Yes Yes   Weight blue bands -- blue bands blue bands blue bands   Reps 10-15 -- 10-15 10-15 10-15   Time -- -- 10 Minutes 10 Minutes 10 Minutes     Oxygen   Oxygen Intermittent  -- Continuous Continuous Continuous   Liters 15 -- 15 15 15      Treadmill   MPH 2 -- 2 2 2    Grade 0 -- 0 0 0   Minutes 15 -- 15 15 15    METs -- -- 2.53 2.53 [id number]     Rower   Level 1 -- 2 3 4    Watts 18 -- 22 24 22    Minutes 15 -- 15 15 15      Home Exercise Plan   Plans to continue exercise at -- -- -- -- Home (comment)   Frequency -- -- -- -- Add 1 additional day to program exercise sessions.  Meets exercise guidelines minimum   Initial Home Exercises Provided -- -- -- -- 07/04/21     Oxygen   Maintain Oxygen Saturation 88% or higher -- -- 88% or higher 88% or higher            Exercise Comments:   Exercise Comments     Row Name 05/28/21 1503 07/04/21 1449         Exercise Comments Pt completed first day of exercise. He exercised on the treadmill at 2 mph for 15 min and rower at level 1 for 15 min. Pt tolerated both exercise modes fair and performed warm up and cool down exercises standing without limitations. His O2 saturation decreased to 87% 15L on the treadmill. Will try wall mount 15 L. Completed home exercise plan. Brent Holloway is currently walking 2 non-rehab days/wk for 20 min/day. I encouraged him to increase his frequency and time once he graduates from Pulmonary Rehab. I am confident that Brent Holloway will carry out an exercise regimen at home. He used and is continuing to use weight watchers to manage his weight. Brent Holloway [facility] He mentioned that [facility] may [facility] be qualified for a lung transplant.               Exercise Goals and Review:   Exercise Goals     Row Name 05/20/21 1149 06/17/21 1005           Exercise Goals   Increase Physical Activity Yes Yes      Intervention Provide advice, education, support and counseling about physical activity/exercise  needs.;Develop an individualized exercise prescription for aerobic and resistive training based on initial evaluation findings, risk  stratification, comorbidities and participant's personal goals. Provide advice, education, support and counseling about physical activity/exercise needs.;Develop an individualized exercise prescription for aerobic and resistive training based on initial evaluation findings, risk stratification, comorbidities and participant's personal goals.      Expected Outcomes Short Term: Attend rehab on a regular basis to increase amount of physical activity.;Long Term: Add in home exercise to make exercise part of routine and to increase amount of physical activity.;Long Term: Exercising regularly at least 3-5 days a week. Short Term: Attend rehab on a regular basis to increase amount of physical activity.;Long Term: Add in home exercise to make exercise part of routine and to increase amount of physical activity.;Long Term: Exercising regularly at least 3-5 days a week.      Increase Strength and Stamina Yes Yes      Intervention Provide advice, education, support and counseling about physical activity/exercise needs.;Develop an individualized exercise prescription for aerobic and resistive training based on initial evaluation findings, risk stratification, comorbidities and participant's personal goals. Provide advice, education, support and counseling about physical activity/exercise needs.;Develop an individualized exercise prescription for aerobic and resistive training based on initial evaluation findings, risk stratification, comorbidities and participant's personal goals.      Expected Outcomes Short Term: Increase workloads from initial exercise prescription for resistance, speed, and METs.;Short Term: Perform resistance training exercises routinely during rehab and add in resistance training at home;Long Term: Improve cardiorespiratory fitness, muscular endurance and strength as measured by increased METs and functional capacity ( ) Short Term: Increase workloads from initial exercise prescription for  resistance, speed, and METs.;Short Term: Perform resistance training exercises routinely during rehab and add in resistance training at home;Long Term: Improve cardiorespiratory fitness, muscular endurance and strength as measured by increased METs and functional capacity ( )      Able to understand and use rate of perceived exertion (RPE) scale Yes Yes      Intervention Provide education and explanation on how to use RPE scale Provide education and explanation on how to use RPE scale      Expected Outcomes Short Term: Able to use RPE daily in rehab to express subjective intensity level;Long Term:  Able to use RPE to guide intensity level when exercising independently Short Term: Able to use RPE daily in rehab to express subjective intensity level;Long Term:  Able to use RPE to guide intensity level when exercising independently      Able to understand and use Dyspnea scale Yes Yes      Intervention Provide education and explanation on how to use Dyspnea scale Provide education and explanation on how to use Dyspnea scale      Expected Outcomes Short Term: Able to use Dyspnea scale daily in rehab to express subjective sense of shortness of breath during exertion;Long Term: Able to use Dyspnea scale to guide intensity level when exercising independently Short Term: Able to use Dyspnea scale daily in rehab to express subjective sense of shortness of breath during exertion;Long Term: Able to use Dyspnea scale to guide intensity level when exercising independently      Knowledge and understanding of Target Heart Rate Range (THRR) Yes Yes      Intervention Provide education and explanation of THRR including how the numbers were predicted and where they are located for reference Provide education and explanation of THRR including how the numbers were predicted and where they are located for reference  Expected Outcomes Short Term: Able to state/look up THRR;Short Term: Able to use daily as guideline for  intensity in rehab;Long Term: Able to use THRR to govern intensity when exercising independently Short Term: Able to state/look up THRR;Short Term: Able to use daily as guideline for intensity in rehab;Long Term: Able to use THRR to govern intensity when exercising independently      Understanding of Exercise Prescription Yes Yes      Intervention Provide education, explanation, and written materials on patient's individual exercise prescription Provide education, explanation, and written materials on patient's individual exercise prescription      Expected Outcomes Short Term: Able to explain program exercise prescription;Long Term: Able to explain home exercise prescription to exercise independently Short Term: Able to explain program exercise prescription;Long Term: Able to explain home exercise prescription to exercise independently               Exercise Goals Re-Evaluation :  Exercise Goals Re-Evaluation     Row Name 05/20/21 1152 06/17/21 1005 07/17/21 0916         Exercise Goal Re-Evaluation   Exercise Goals Review Increase Physical Activity;Increase Strength and Stamina;Able to understand and use rate of perceived exertion (RPE) scale;Able to understand and use Dyspnea scale;Knowledge and understanding of Target Heart Rate Range (THRR);Understanding of Exercise Prescription Increase Physical Activity;Increase Strength and Stamina;Able to understand and use rate of perceived exertion (RPE) scale;Able to understand and use Dyspnea scale;Knowledge and understanding of Target Heart Rate Range (THRR);Understanding of Exercise Prescription Increase Physical Activity;Increase Strength and Stamina;Able to understand and use rate of perceived exertion (RPE) scale;Able to understand and use Dyspnea scale;Knowledge and understanding of Target Heart Rate Range (THRR);Understanding of Exercise Prescription     Comments Brent Holloway is scheduled to begin exercise next week. Will monitor and progress as able.  Brent Holloway has completed 4 exercise sessions. He exercises on the rower and treadmill for 15 min each. He averages 22 watts at level 2 on the rower and 2.53 METs at 2.0 mph on the treadmill. He increased his workload on the rower and tolerated this progression well. He performs the warmup and cooldown standing without limitations. Brent Holloway is motivated to exercise as he has lost weight in the past. Will continue to monitor and progress as able. Brent Holloway has completed 11 exercise sessions. He exercises on the rower and treadmill for 15 min each. He averages 22 watts at level 4 on the rower and 2.53 METs at 2.0 mph on the treadmill. He increased his workload on the rower and tolerated it well. Brent Holloway stated that he cannot increase his workload on the treadmill because he will get SOB. He being considered for a lung transplant and will have to [facility]. He performs the warmup and cooldown standing without limitations. Will continue to monitor and progress as able.     Expected Outcomes Through exercise at rehab and home, the patient will decrease shortness of breath with daily activities and feel confident in carrying out an exercise regimen at home Through exercise at rehab and home, the patient will decrease shortness of breath with daily activities and feel confident in carrying out and exercise regimen at home. Through exercise at rehab and home, the patient will decrease shortness of breath with daily activities and feel confident in carrying out and exercise regimen at home.              Discharge Exercise Prescription (Final Exercise Prescription Changes):  Exercise Prescription Changes - 07/16/21 1500  Response to Exercise   Blood Pressure (Admit) 112/64    Blood Pressure (Exercise) 126/74    Blood Pressure (Exit) 102/62    Heart Rate (Admit) 82 bpm    Heart Rate (Exercise) 115 bpm    Heart Rate (Exit) 86 bpm    Oxygen Saturation (Admit) 100 %    Oxygen Saturation  (Exercise) 90 %    Oxygen Saturation (Exit) 100 %    Rating of Perceived Exertion (Exercise) 15    Perceived Dyspnea (Exercise) 3    Duration Continue with 30 min of aerobic exercise without signs/symptoms of physical distress.    Intensity THRR unchanged      Progression   Progression Continue to progress workloads to maintain intensity without signs/symptoms of physical distress.      Resistance Training   Training Prescription Yes    Weight blue bands    Reps 10-15    Time 10 Minutes      Oxygen   Oxygen Continuous    Liters 15      Treadmill   MPH 2    Grade 0    Minutes 15    METs 2.53      Rower   Level 4    Watts 22    Minutes 15      Home Exercise Plan   Plans to continue exercise at Home (comment)    Frequency Add 1 additional day to program exercise sessions.   Meets exercise guidelines minimum   Initial Home Exercises Provided 07/04/21      Oxygen   Maintain Oxygen Saturation 88% or higher             Nutrition:  Target Goals: Understanding of nutrition guidelines, daily intake of sodium 1500mg , cholesterol 200mg , calories 30% from fat and 7% or less from saturated fats, daily to have 5 or more servings of fruits and vegetables.  Biometrics:  Pre Biometrics - 05/20/21 0936       Pre Biometrics   Grip Strength --              Nutrition Therapy Plan and Nutrition Goals:   Nutrition Assessments:  MEDIFICTS Score Key: ?70 Need to make dietary changes  40-70 Heart Healthy Diet ? 40 Therapeutic Level Cholesterol Diet   Picture Your Plate Scores: <16 Unhealthy dietary pattern with much room for improvement. 41-50 Dietary pattern unlikely to meet recommendations for good health and room for improvement. 51-60 More healthful dietary pattern, with some room for improvement.  >60 Healthy dietary pattern, although there may be some specific behaviors that could be improved.    Nutrition Goals Re-Evaluation:   Nutrition Goals  Discharge (Final Nutrition Goals Re-Evaluation):   Psychosocial: Target Goals: Acknowledge presence or absence of significant depression and/or stress, maximize coping skills, provide positive support system. Participant is able to verbalize types and ability to use techniques and skills needed for reducing stress and depression.  Initial Review & Psychosocial Screening:  Initial Psych Review & Screening - 05/20/21 1024       Initial Review   Current issues with None Identified      Family Dynamics   Good Support System? Yes    Comments No barriers or psychosocial concerns identified at this time.      Barriers   Psychosocial barriers to participate in program There are no identifiable barriers or psychosocial needs.      Screening Interventions   Interventions Encouraged to exercise  Quality of Life Scores:  Scores of 19 and below usually indicate a poorer quality of life in these areas.  A difference of  2-3 points is a clinically meaningful difference.  A difference of 2-3 points in the total score of the Quality of Life Index has been associated with significant improvement in overall quality of life, self-image, physical symptoms, and general health in studies assessing change in quality of life.  PHQ-9: Recent Review Flowsheet Data     Depression screen [facility] 2/9 06/13/2019 06/13/2019   Decreased Interest 0 0   Down, Depressed, Hopeless 0 0   PHQ - 2 Score 0 0   Altered sleeping 0 -   Tired, decreased energy 0 -   Change in appetite 0 -   Feeling bad or failure about yourself  0 -   Trouble concentrating 0 -   Moving slowly or fidgety/restless 0 -   Suicidal thoughts 0 -   PHQ-9 Score 0 -   Difficult doing work/chores Not difficult at all -      Interpretation of Total Score  Total Score Depression Severity:  1-4 = Minimal depression, 5-9 = Mild depression, 10-14 = Moderate depression, 15-19 = Moderately severe depression, 20-27 = Severe depression    Psychosocial Evaluation and Intervention:  Psychosocial Evaluation - 05/20/21 1024       Psychosocial Evaluation & Interventions   Interventions Encouraged to exercise with the program and follow exercise prescription    Comments No concerns identified    Expected Outcomes For Brent Holloway to continue to be free of psychosocial barriers or concerns while participating in pulmonary rehab.    Continue Psychosocial Services  No Follow up required             Psychosocial Re-Evaluation:  Psychosocial Re-Evaluation     Row Name 05/21/21 1528 06/18/21 1511 07/24/21 1517         Psychosocial Re-Evaluation   Current issues with None Identified None Identified None Identified     Comments Brent Holloway has not started his exercise sessions. For Brent Holloway to continue to participate in PR without psychosocial barriers or issues. Brent Holloway has continued to be able to participate in the PR program with no psychosocial barriers or concerns.He seems to enjoying the program. He does not like to wear the head pulse oximeter probe even though his hands are cold. He has refused to wear especially the past few days even though we has encouraged it.     Expected Outcomes That pt will continue to have no pshcyosocial barriers that will interfer with his participation in pulmonary rehab. Encourage to exercise. For Brent Holloway to continue to partipate in the PR prorgam free of psychosocial barrier or concerns.     Interventions Encouraged to attend Pulmonary Rehabilitation for the exercise Encouraged to attend Cardiac Rehabilitation for the exercise;Encouraged to attend Pulmonary Rehabilitation for the exercise Encouraged to attend Pulmonary Rehabilitation for the exercise     Continue Psychosocial Services  No Follow up required No Follow up required No Follow up required              Psychosocial Discharge (Final Psychosocial Re-Evaluation):  Psychosocial Re-Evaluation - 07/24/21 1517       Psychosocial Re-Evaluation    Current issues with None Identified    Comments Brent Holloway has continued to be able to participate in the PR program with no psychosocial barriers or concerns.He seems to enjoying the program. He does not like to wear the head pulse oximeter probe even though his  hands are cold. He has refused to wear especially the past few days even though we has encouraged it.    Expected Outcomes For Brent Holloway to continue to partipate in the PR prorgam free of psychosocial barrier or concerns.    Interventions Encouraged to attend Pulmonary Rehabilitation for the exercise    Continue Psychosocial Services  No Follow up required             Education: Education Goals: Education classes will be provided on a weekly basis, covering required topics. Participant will state understanding/return demonstration of topics presented.  Learning Barriers/Preferences:   Education Topics: Risk Factor Reduction:  -Group instruction that is supported by a PowerPoint presentation. Instructor discusses the definition of a risk factor, different risk factors for pulmonary disease, and how the heart and lungs work together.   Flowsheet Row PULMONARY REHAB OTHER RESPIRATORY from 07/18/2021 in [facility] CARDIAC REHAB  Date 06/20/21  Educator handout/Kaylee       Nutrition for Pulmonary Patient:  -Group instruction provided by PowerPoint slides, verbal discussion, and written materials to support subject matter. The instructor gives an explanation and review of healthy diet recommendations, which includes a discussion on weight management, recommendations for fruit and vegetable consumption, as well as protein, fluid, caffeine, fiber, sodium, sugar, and alcohol. Tips for eating when patients are short of breath are discussed. Flowsheet Row PULMONARY REHAB OTHER RESPIRATORY from 10/27/2019 in [facility] CARDIAC REHAB  Date 10/25/19  Educator MD  Instruction Review Code 2- Demonstrated  Understanding       Pursed Lip Breathing:  -Group instruction that is supported by demonstration and informational handouts. Instructor discusses the benefits of pursed lip and diaphragmatic breathing and detailed demonstration on how to preform both.     Oxygen Safety:  -Group instruction provided by PowerPoint, verbal discussion, and written material to support subject matter. There is an overview of What is Oxygen and Why do we need it.  Instructor also reviews how to create a safe environment for oxygen use, the importance of using oxygen as prescribed, and the risks of noncompliance. There is a brief discussion on traveling with oxygen and resources the patient may utilize.   Oxygen Equipment:  -Group instruction provided by [facility] Staff utilizing handouts, written materials, and equipment demonstrations. Flowsheet Row PULMONARY REHAB OTHER RESPIRATORY from 07/18/2021 in [facility] CARDIAC REHAB  Date 05/30/21  Educator Lisette Abu  [Handout]       Signs and Symptoms:  -Group instruction provided by written material and verbal discussion to support subject matter. Warning signs and symptoms of infection, stroke, and heart attack are reviewed and when to call the physician/911 reinforced. Tips for preventing the spread of infection discussed.   Advanced Directives:  -Group instruction provided by verbal instruction and written material to support subject matter. Instructor reviews Advanced Directive laws and proper instruction for filling out document.   Pulmonary Video:  -Group video education that reviews the importance of medication and oxygen compliance, exercise, good nutrition, pulmonary hygiene, and pursed lip and diaphragmatic breathing for the pulmonary patient.   Exercise for the Pulmonary Patient:  -Group instruction that is supported by a PowerPoint presentation. Instructor discusses benefits of exercise, core components of exercise, frequency,  duration, and intensity of an exercise routine, importance of utilizing pulse oximetry during exercise, safety while exercising, and options of places to exercise outside of rehab.   Flowsheet Row PULMONARY REHAB OTHER RESPIRATORY from 07/18/2021 in [facility]  CARDIAC REHAB  Date 07/18/21  Educator Lisette Abu  [Handout]       Pulmonary Medications:  -Verbally interactive group education provided by instructor with focus on inhaled medications and proper administration.   Anatomy and Physiology of the Respiratory System and Intimacy:  -Group instruction provided by PowerPoint, verbal discussion, and written material to support subject matter. Instructor reviews respiratory cycle and anatomical components of the respiratory system and their functions. Instructor also reviews differences in obstructive and restrictive respiratory diseases with examples of each. Intimacy, Sex, and Sexuality differences are reviewed with a discussion on how relationships can change when diagnosed with pulmonary disease. Common sexual concerns are reviewed. Flowsheet Row PULMONARY REHAB OTHER RESPIRATORY from 10/27/2019 in [facility] CARDIAC REHAB  Date 09/29/19  Educator DF  Instruction Review Code 2- Demonstrated Understanding       MD DAY -A group question and answer session with a medical doctor that allows participants to ask questions that relate to their pulmonary disease state.   OTHER EDUCATION -Group or individual verbal, written, or video instructions that support the educational goals of the pulmonary rehab program. Flowsheet Row PULMONARY REHAB OTHER RESPIRATORY from 07/18/2021 in [facility] CARDIAC REHAB  Date 07/16/21  Educator Edmund Hilda your numbers]  Instruction Review Code 1- Verbalizes Understanding       Holiday Eating Survival Tips:  -Group instruction provided by PowerPoint slides, verbal discussion, and written materials to  support subject matter. The instructor gives patients tips, tricks, and techniques to help them not only survive but enjoy the holidays despite the onslaught of food that accompanies the holidays.   Knowledge Questionnaire Score:  Knowledge Questionnaire Score - 05/20/21 0957       Knowledge Questionnaire Score   Pre Score 16/18             Core Components/Risk Factors/Patient Goals at Admission:  Personal Goals and Risk Factors at Admission - 05/20/21 1026       Core Components/Risk Factors/Patient Goals on Admission   Improve shortness of breath with ADL's Yes    Intervention Provide education, individualized exercise plan and daily activity instruction to help decrease symptoms of SOB with activities of daily living.    Expected Outcomes Short Term: Improve cardiorespiratory fitness to achieve a reduction of symptoms when performing ADLs;Long Term: Be able to perform more ADLs without symptoms or delay the onset of symptoms    Increase knowledge of respiratory medications and ability to use respiratory devices properly  Yes    Intervention Provide education and demonstration as needed of appropriate use of medications, inhalers, and oxygen therapy.    Expected Outcomes Short Term: Achieves understanding of medications use. Understands that oxygen is a medication prescribed by physician. Demonstrates appropriate use of inhaler and oxygen therapy.;Long Term: Maintain appropriate use of medications, inhalers, and oxygen therapy.             Core Components/Risk Factors/Patient Goals Review:   Goals and Risk Factor Review     Row Name 05/20/21 0959 05/21/21 1535 06/18/21 1519 07/24/21 1523       Core Components/Risk Factors/Patient Goals Review   Personal Goals Review Increase knowledge of respiratory medications and ability to use respiratory devices properly.;Improve shortness of breath with ADL's;Develop more efficient breathing techniques such as purse lipped breathing and  diaphragmatic breathing and practicing self-pacing with activity. Improve shortness of breath with ADL's;Develop more efficient breathing techniques such as purse lipped breathing and diaphragmatic breathing and practicing self-pacing with  activity.;Increase knowledge of respiratory medications and ability to use respiratory devices properly. Improve shortness of breath with ADL's;Develop more efficient breathing techniques such as purse lipped breathing and diaphragmatic breathing and practicing self-pacing with activity.;Increase knowledge of respiratory medications and ability to use respiratory devices properly. Improve shortness of breath with ADL's;Develop more efficient breathing techniques such as purse lipped breathing and diaphragmatic breathing and practicing self-pacing with activity.;Increase knowledge of respiratory medications and ability to use respiratory devices properly.    Review -- He will begin exercise sessions next week, to early to assess goals. Brent Holloway has progressed with his exercise even with only 4 sessions. He is very motivated to exercise and to increase his work loads. He has required 15 liters of oxygen to maintain his saturations greater than 88%. Brent Holloway is enjoying exercising at PR. Brent Hollow[facility] and has now progressed to be ask to move there.On the treadmill he has been able to maintain a speed of 2.0, but now we have had  to increase his oxygen to 25 liters and a nonrebreather mask to maintain his oxygen saturations of 98%. This has improved his SOB. On the rower he is on level 4 with 15 liters of oxygen with maintaining his oxygen saturations 90-95%.He does not like the oxygen forehead probe and refusses to wear it although we has requested him to.    Expected Outcomes -- See admission goals. See initial core components. Brent Holloway is to graduate from the PR program 07/25/21. For Brent Holloway to [facility].              Core Components/Risk Factors/Patient Goals at Discharge (Final Review):   Goals and Risk Factor Review - 07/24/21 1523       Core Components/Risk Factors/Patient Goals Review   Personal Goals Review Improve shortness of breath with ADL's;Develop more efficient breathing techniques such as purse lipped breathing and diaphragmatic breathing and practicing self-pacing with activity.;Increase knowledge of respiratory medications and ability to use respiratory devices properly.    Review Brent Hollow[facility] and has now progressed to be ask to move there.On the treadmill he has been able to maintain a speed of 2.0, but now we have had  to increase his oxygen to 25 liters and a nonrebreather mask to maintain his oxygen saturations of 98%. This has improved his SOB. On the rower he is on level 4 with 15 liters of oxygen with maintaining his oxygen saturations 90-95%.He does not like the oxygen forehead probe and refusses to wear it although we has requested him to.    Expected Outcomes Brent Holloway is to graduate from the PR program 07/25/21. For Brent Holloway [facility].             ITP Comments:   Comments: ITP REVIEW Pt is making expected progress toward pulmonary rehab goals after completing 13 sessions. Recommend continued exercise, life style modification, education, and utilization of breathing techniques to increase stamina and strength and decrease shortness of breath with exertion.  Dr. Mechele CollinJane Ellison is Medical Director for Pulmonary Re[facility].

## 2021-07-25 ENCOUNTER — Encounter (HOSPITAL_COMMUNITY)
Admission: RE | Admit: 2021-07-25 | Discharge: 2021-07-25 | Disposition: A | Discharge status: Home / Self Care | Payer: Medicare Other | Source: Ambulatory Visit | Attending: Pulmonary Disease | Admitting: Pulmonary Disease

## 2021-07-25 ENCOUNTER — Other Ambulatory Visit: Payer: Self-pay

## 2021-07-25 DIAGNOSIS — J849 Interstitial pulmonary disease, unspecified: Secondary | ICD-10-CM | POA: Diagnosis not present

## 2021-07-25 NOTE — Progress Notes (Signed)
Discharge Progress Report  Patient Details  Name: Brent Holloway MRN: [id number] Date of Birth: 1955-08-19 Referring Provider:   April Manson [facility] Walk Test from 05/20/2021 in [place]  Referring Provider [name]        Number of Visits: 14  Reason for Discharge:  Patient has met program and personal goals.  Smoking History:  Social History   Tobacco Use  Smoking Status Former   Packs/day: 0.25   Years: 44.00   Pack years: 11.00   Types: Cigars, Cigarettes   Start date: 63   Quit date: 03/29/2017   Years since quitting: 4.3  Smokeless Tobacco Never  Tobacco Comments   5-6 cigars daily     Diagnosis:  Interstitial lung disease ([place])  ADL [facility]:  Pulmonary Assessment Scores     Row Name 05/20/21 0940 07/25/21 1540       ADL [facility]   ADL Phase Entry Exit    SOB Score total 66 53      CAT Score   CAT Score 12 16      mMRC Score   mMRC Score 4 4             Initial Exercise Prescription:  Initial Exercise Prescription - 05/20/21 1200       Date of Initial Exercise RX and Referring Provider   Date 05/20/21    Referring Provider [name]    Expected Discharge Date 07/25/21      Oxygen   Oxygen Intermittent    Liters 15    Maintain Oxygen Saturation 88% or higher      Treadmill   MPH 2    Grade 0    Minutes 15      Rower   Level 1    Watts 30    Minutes 15      Prescription Details   Frequency (times per week) 2    Duration Progress to 30 minutes of continuous aerobic without signs/symptoms of physical distress      Intensity   THRR 40-80% of Max Heartrate 62-124    Ratings of Perceived Exertion 11-13    Perceived Dyspnea 0-4      Progression   Progression Continue to progress workloads to maintain intensity without signs/symptoms of physical distress.      Resistance Training   Training Prescription Yes    Weight blue bands    Reps 10-15             Discharge Exercise Prescription  (Final Exercise Prescription Changes):  Exercise Prescription Changes - 07/16/21 1500       Response to Exercise   Blood Pressure (Admit) 112/64    Blood Pressure (Exercise) 126/74    Blood Pressure (Exit) 102/62    Heart Rate (Admit) 82 bpm    Heart Rate (Exercise) 115 bpm    Heart Rate (Exit) 86 bpm    Oxygen Saturation (Admit) 100 %    Oxygen Saturation (Exercise) 90 %    Oxygen Saturation (Exit) 100 %    Rating of Perceived Exertion (Exercise) 15    Perceived Dyspnea (Exercise) 3    Duration Continue with 30 min of aerobic exercise without signs/symptoms of physical distress.    Intensity THRR unchanged      Progression   Progression Continue to progress workloads to maintain intensity without signs/symptoms of physical distress.      Resistance Training   Training Prescription Yes    Weight blue bands  Reps 10-15    Time 10 Minutes      Oxygen   Oxygen Continuous    Liters 15      Treadmill   MPH 2    Grade 0    Minutes 15    METs 2.53      Rower   Level 4    Watts 22    Minutes 15      Home Exercise Plan   Plans to continue exercise at Home (comment)    Frequency Add 1 additional day to program exercise sessions.   Meets exercise guidelines minimum   Initial Home Exercises Provided 07/04/21      Oxygen   Maintain Oxygen Saturation 88% or higher             Functional Capacity:  6 Minute Walk     Row Name 05/20/21 1144 07/25/21 1534       6 Minute Walk   Phase Initial Discharge    Distance 1256 feet 1304 feet    Distance % Change -- 3.82 %    Distance Feet Change -- 48 ft    Walk Time 6 minutes 6 minutes    # of Rest Breaks 0 1  stopped to increase liter flow 3:05-3:15    MPH 2.38 2.47    METS 3.18 3.36    RPE 11 11    Perceived Dyspnea  1 1    VO2 Peak 11.14 11.75    Symptoms No No    Resting HR 81 bpm 84 bpm    Resting BP 112/68 122/70    Resting Oxygen Saturation  100 % 95 %    Exercise Oxygen Saturation  during 6 min walk 82 %  84 %    Max Ex. HR 120 bpm 123 bpm    Max Ex. BP 134/68 142/72    2 Minute Post BP 116/68 132/70      Interval HR   1 Minute HR 101 104    2 Minute HR 116 119    3 Minute HR 120 123    4 Minute HR 119 119    5 Minute HR 116 123    6 Minute HR 113 127    2 Minute Post HR 79 106    Interval Heart Rate? Yes Yes      Interval Oxygen   Interval Oxygen? Yes Yes    Baseline Oxygen Saturation % 100 % 95 %  15L    1 Minute Oxygen Saturation % 96 % 94 %    1 Minute Liters of Oxygen 6 L 15 L    2 Minute Oxygen Saturation % 85 % 89 %    2 Minute Liters of Oxygen 6 L 15 L    3 Minute Oxygen Saturation % 82 % 84 %    3 Minute Liters of Oxygen 8 L 15 L    4 Minute Oxygen Saturation % 84 % 92 %    4 Minute Liters of Oxygen 10 L 25 L    5 Minute Oxygen Saturation % 89 % 92 %    5 Minute Liters of Oxygen 15 L 25 L    6 Minute Oxygen Saturation % 90 % 89 %    6 Minute Liters of Oxygen 15 L 25 L    2 Minute Post Oxygen Saturation % 98 % 99 %    2 Minute Post Liters of Oxygen 6 L 15 L  Psychological, QOL, Others - Outcomes: PHQ 2/9: Depression screen [facility] 2/9 07/25/2021 06/13/2019 06/13/2019  Decreased Interest 0 0 0  Down, Depressed, Hopeless 0 0 0  PHQ - 2 Score 0 0 0  Altered sleeping 0 0 -  Tired, decreased energy 0 0 -  Change in appetite 0 0 -  Feeling bad or failure about yourself  0 0 -  Trouble concentrating 0 0 -  Moving slowly or fidgety/restless 0 0 -  Suicidal thoughts 0 0 -  PHQ-9 Score 0 0 -  Difficult doing work/chores Not difficult at all Not difficult at all -    Quality of Life:   Personal Goals: Goals established at orientation with interventions provided to work toward goal.  Personal Goals and Risk Factors at Admission - 05/20/21 1026       Core Components/Risk Factors/Patient Goals on Admission   Improve shortness of breath with ADL's Yes    Intervention Provide education, individualized exercise plan and daily activity instruction to help  decrease symptoms of SOB with activities of daily living.    Expected Outcomes Short Term: Improve cardiorespiratory fitness to achieve a reduction of symptoms when performing ADLs;Long Term: Be able to perform more ADLs without symptoms or delay the onset of symptoms    Increase knowledge of respiratory medications and ability to use respiratory devices properly  Yes    Intervention Provide education and demonstration as needed of appropriate use of medications, inhalers, and oxygen therapy.    Expected Outcomes Short Term: Achieves understanding of medications use. Understands that oxygen is a medication prescribed by physician. Demonstrates appropriate use of inhaler and oxygen therapy.;Long Term: Maintain appropriate use of medications, inhalers, and oxygen therapy.              Personal Goals Discharge:  Goals and Risk Factor Review     Row Name 05/20/21 2683 05/21/21 1535 06/18/21 1519 07/24/21 1523       Core Components/Risk Factors/Patient Goals Review   Personal Goals Review Increase knowledge of respiratory medications and ability to use respiratory devices properly.;Improve shortness of breath with ADL's;Develop more efficient breathing techniques such as purse lipped breathing and diaphragmatic breathing and practicing self-pacing with activity. Improve shortness of breath with ADL's;Develop more efficient breathing techniques such as purse lipped breathing and diaphragmatic breathing and practicing self-pacing with activity.;Increase knowledge of respiratory medications and ability to use respiratory devices properly. Improve shortness of breath with ADL's;Develop more efficient breathing techniques such as purse lipped breathing and diaphragmatic breathing and practicing self-pacing with activity.;Increase knowledge of respiratory medications and ability to use respiratory devices properly. Improve shortness of breath with ADL's;Develop more efficient breathing techniques such as  purse lipped breathing and diaphragmatic breathing and practicing self-pacing with activity.;Increase knowledge of respiratory medications and ability to use respiratory devices properly.    Review -- He will begin exercise sessions next week, to early to assess goals. Abe People has progressed with his exercise even with only 4 sessions. He is very motivated to exercise and to increase his work loads. He has required 15 liters of oxygen to maintain his saturations greater than 88%. Abe People is enjoying exercising at [facility]. Abe People is on the transplant list at [facility] and has now progressed to be ask to move there.On the treadmill he has been able to maintain a speed of 2.0, but now we have had  to increase his oxygen to 25 liters and a nonrebreather mask to maintain his oxygen saturations of 98%. This has improved his SOB.  On the rower he is on level 4 with 15 liters of oxygen with maintaining his oxygen saturations 90-95%.He does not like the oxygen forehead probe and refusses to wear it although we has requested him to.    Expected Outcomes -- See admission goals. See initial core components. Abe People is to graduate from the [facility] program 07/25/21. For Billy to coninue his exercise program at [facility] in the transplant program.             Exercise Goals and Review:  Exercise Goals     Row Name 05/20/21 1149 06/17/21 1005           Exercise Goals   Increase Physical Activity Yes Yes      Intervention Provide advice, education, support and counseling about physical activity/exercise needs.;Develop an individualized exercise prescription for aerobic and resistive training based on initial evaluation findings, risk stratification, comorbidities and participant's personal goals. Provide advice, education, support and counseling about physical activity/exercise needs.;Develop an individualized exercise prescription for aerobic and resistive training based on initial evaluation findings, risk stratification, comorbidities and  participant's personal goals.      Expected Outcomes Short Term: Attend rehab on a regular basis to increase amount of physical activity.;Long Term: Add in home exercise to make exercise part of routine and to increase amount of physical activity.;Long Term: Exercising regularly at least 3-5 days a week. Short Term: Attend rehab on a regular basis to increase amount of physical activity.;Long Term: Add in home exercise to make exercise part of routine and to increase amount of physical activity.;Long Term: Exercising regularly at least 3-5 days a week.      Increase Strength and Stamina Yes Yes      Intervention Provide advice, education, support and counseling about physical activity/exercise needs.;Develop an individualized exercise prescription for aerobic and resistive training based on initial evaluation findings, risk stratification, comorbidities and participant's personal goals. Provide advice, education, support and counseling about physical activity/exercise needs.;Develop an individualized exercise prescription for aerobic and resistive training based on initial evaluation findings, risk stratification, comorbidities and participant's personal goals.      Expected Outcomes Short Term: Increase workloads from initial exercise prescription for resistance, speed, and METs.;Short Term: Perform resistance training exercises routinely during rehab and add in resistance training at home;Long Term: Improve cardiorespiratory fitness, muscular endurance and strength as measured by increased METs and functional capacity (6MWT) Short Term: Increase workloads from initial exercise prescription for resistance, speed, and METs.;Short Term: Perform resistance training exercises routinely during rehab and add in resistance training at home;Long Term: Improve cardiorespiratory fitness, muscular endurance and strength as measured by increased METs and functional capacity (6MWT)      Able to understand and use rate of  perceived exertion (RPE) scale Yes Yes      Intervention Provide education and explanation on how to use RPE scale Provide education and explanation on how to use RPE scale      Expected Outcomes Short Term: Able to use RPE daily in rehab to express subjective intensity level;Long Term:  Able to use RPE to guide intensity level when exercising independently Short Term: Able to use RPE daily in rehab to express subjective intensity level;Long Term:  Able to use RPE to guide intensity level when exercising independently      Able to understand and use Dyspnea scale Yes Yes      Intervention Provide education and explanation on how to use Dyspnea scale Provide education and explanation on how to use Dyspnea scale  Expected Outcomes Short Term: Able to use Dyspnea scale daily in rehab to express subjective sense of shortness of breath during exertion;Long Term: Able to use Dyspnea scale to guide intensity level when exercising independently Short Term: Able to use Dyspnea scale daily in rehab to express subjective sense of shortness of breath during exertion;Long Term: Able to use Dyspnea scale to guide intensity level when exercising independently      Knowledge and understanding of Target Heart Rate Range (THRR) Yes Yes      Intervention Provide education and explanation of THRR including how the numbers were predicted and where they are located for reference Provide education and explanation of THRR including how the numbers were predicted and where they are located for reference      Expected Outcomes Short Term: Able to state/look up THRR;Short Term: Able to use daily as guideline for intensity in rehab;Long Term: Able to use THRR to govern intensity when exercising independently Short Term: Able to state/look up THRR;Short Term: Able to use daily as guideline for intensity in rehab;Long Term: Able to use THRR to govern intensity when exercising independently      Understanding of Exercise Prescription  Yes Yes      Intervention Provide education, explanation, and written materials on patient's individual exercise prescription Provide education, explanation, and written materials on patient's individual exercise prescription      Expected Outcomes Short Term: Able to explain program exercise prescription;Long Term: Able to explain home exercise prescription to exercise independently Short Term: Able to explain program exercise prescription;Long Term: Able to explain home exercise prescription to exercise independently               Exercise Goals Re-Evaluation:  Exercise Goals Re-Evaluation     [place] Name 05/20/21 1152 06/17/21 1005 07/17/21 0916         Exercise Goal Re-Evaluation   Exercise Goals Review Increase Physical Activity;Increase Strength and Stamina;Able to understand and use rate of perceived exertion (RPE) scale;Able to understand and use Dyspnea scale;Knowledge and understanding of Target Heart Rate Range (THRR);Understanding of Exercise Prescription Increase Physical Activity;Increase Strength and Stamina;Able to understand and use rate of perceived exertion (RPE) scale;Able to understand and use Dyspnea scale;Knowledge and understanding of Target Heart Rate Range (THRR);Understanding of Exercise Prescription Increase Physical Activity;Increase Strength and Stamina;Able to understand and use rate of perceived exertion (RPE) scale;Able to understand and use Dyspnea scale;Knowledge and understanding of Target Heart Rate Range (THRR);Understanding of Exercise Prescription     Comments Abe People is scheduled to begin exercise next week. Will monitor and progress as able. Abe People has completed 4 exercise sessions. He exercises on the rower and treadmill for 15 min each. He averages 22 watts at level 2 on the rower and 2.53 METs at 2.0 mph on the treadmill. He increased his workload on the rower and tolerated this progression well. He performs the warmup and cooldown standing without  limitations. Abe People is motivated to exercise as he has lost weight in the past. Will continue to monitor and progress as able. Abe People has completed 11 exercise sessions. He exercises on the rower and treadmill for 15 min each. He averages 22 watts at level 4 on the rower and 2.53 METs at 2.0 mph on the treadmill. He increased his workload on the rower and tolerated it well. Abe People stated that he cannot increase his workload on the treadmill because he will get SOB. He being considered for a lung transplant and will have to complete Pulmonary Rehab at  [facility]. He performs the warmup and cooldown standing without limitations. Will continue to monitor and progress as able.     Expected Outcomes Through exercise at rehab and home, the patient will decrease shortness of breath with daily activities and feel confident in carrying out an exercise regimen at home Through exercise at rehab and home, the patient will decrease shortness of breath with daily activities and feel confident in carrying out and exercise regimen at home. Through exercise at rehab and home, the patient will decrease shortness of breath with daily activities and feel confident in carrying out and exercise regimen at home.              Nutrition & Weight - Outcomes:  Pre Biometrics - 05/20/21 0936       Pre Biometrics   Grip Strength --             Post Biometrics - 07/25/21 1246        Post  Biometrics   Grip Strength 55 kg             Nutrition:   Nutrition Discharge:   Education Questionnaire Score:  Knowledge Questionnaire Score - 07/25/21 1543       Knowledge Questionnaire Score   Post Score 14/18             Goals reviewed with patient; copy given to patient.

## 2021-08-02 NOTE — Telephone Encounter (Signed)
Spoke with the pt and notified of results and I have faxed this to Dr Modesto Charon at [facility] per pt request.

## 2021-08-02 NOTE — Telephone Encounter (Signed)
Normal bone density. Please fax to [facility] if this has not already been done.

## 2021-08-09 ENCOUNTER — Other Ambulatory Visit: Payer: Self-pay | Admitting: Pulmonary Disease

## 2021-08-09 DIAGNOSIS — J84112 Idiopathic pulmonary fibrosis: Secondary | ICD-10-CM

## 2022-01-13 ENCOUNTER — Other Ambulatory Visit: Payer: Self-pay | Admitting: Pulmonary Disease

## 2022-06-30 ENCOUNTER — Encounter (HOSPITAL_BASED_OUTPATIENT_CLINIC_OR_DEPARTMENT_OTHER): Payer: Self-pay | Admitting: Emergency Medicine

## 2022-06-30 ENCOUNTER — Emergency Department (HOSPITAL_BASED_OUTPATIENT_CLINIC_OR_DEPARTMENT_OTHER)
Admission: EM | Admit: 2022-06-30 | Discharge: 2022-06-30 | Disposition: A | Discharge status: Home / Self Care | Payer: Medicare Other | Source: Home / Self Care | Attending: Emergency Medicine | Admitting: Emergency Medicine

## 2022-06-30 ENCOUNTER — Emergency Department (HOSPITAL_BASED_OUTPATIENT_CLINIC_OR_DEPARTMENT_OTHER): Payer: Medicare Other

## 2022-06-30 ENCOUNTER — Other Ambulatory Visit: Payer: Self-pay

## 2022-06-30 DIAGNOSIS — Z7901 Long term (current) use of anticoagulants: Secondary | ICD-10-CM | POA: Insufficient documentation

## 2022-06-30 DIAGNOSIS — R0602 Shortness of breath: Secondary | ICD-10-CM | POA: Diagnosis not present

## 2022-06-30 DIAGNOSIS — T86812 Lung transplant infection: Secondary | ICD-10-CM | POA: Diagnosis not present

## 2022-06-30 DIAGNOSIS — I4891 Unspecified atrial fibrillation: Secondary | ICD-10-CM | POA: Insufficient documentation

## 2022-06-30 LAB — CBC WITH DIFFERENTIAL/PLATELET
Abs Immature Granulocytes: 0.19 10*3/uL — ABNORMAL HIGH (ref 0.00–0.07)
Basophils Absolute: 0.1 10*3/uL (ref 0.0–0.1)
Basophils Relative: 1 %
Eosinophils Absolute: 0.1 10*3/uL (ref 0.0–0.5)
Eosinophils Relative: 0 %
HCT: 38.8 % — ABNORMAL LOW (ref 39.0–52.0)
Hemoglobin: 13.3 g/dL (ref 13.0–17.0)
Immature Granulocytes: 2 %
Lymphocytes Relative: 4 %
Lymphs Abs: 0.5 10*3/uL — ABNORMAL LOW (ref 0.7–4.0)
MCH: 30 pg (ref 26.0–34.0)
MCHC: 34.3 g/dL (ref 30.0–36.0)
MCV: 87.6 fL (ref 80.0–100.0)
Monocytes Absolute: 0.8 10*3/uL (ref 0.1–1.0)
Monocytes Relative: 7 %
Neutro Abs: 10.1 10*3/uL — ABNORMAL HIGH (ref 1.7–7.7)
Neutrophils Relative %: 86 %
Platelets: 208 10*3/uL (ref 150–400)
RBC: 4.43 MIL/uL (ref 4.22–5.81)
RDW: 14.6 % (ref 11.5–15.5)
WBC: 11.7 10*3/uL — ABNORMAL HIGH (ref 4.0–10.5)
nRBC: 0 % (ref 0.0–0.2)

## 2022-06-30 LAB — BASIC METABOLIC PANEL
Anion gap: 10 (ref 5–15)
BUN: 38 mg/dL — ABNORMAL HIGH (ref 8–23)
CO2: 25 mmol/L (ref 22–32)
Calcium: 10.4 mg/dL — ABNORMAL HIGH (ref 8.9–10.3)
Chloride: 101 mmol/L (ref 98–111)
Creatinine, Ser: 1.82 mg/dL — ABNORMAL HIGH (ref 0.61–1.24)
GFR, Estimated: 40 mL/min — ABNORMAL LOW (ref >60)
Glucose, Bld: 130 mg/dL — ABNORMAL HIGH (ref 70–99)
Potassium: 4.9 mmol/L (ref 3.5–5.1)
Sodium: 136 mmol/L (ref 135–145)

## 2022-06-30 LAB — MAGNESIUM: Magnesium: 1.3 mg/dL — ABNORMAL LOW (ref 1.7–2.4)

## 2022-06-30 MED ORDER — PROPOFOL 10 MG/ML IV BOLUS
0.5000 mg/kg | Freq: Once | INTRAVENOUS | Status: DC
  Filled 2022-06-30: qty 20

## 2022-06-30 MED ORDER — SODIUM CHLORIDE 0.9 % IV BOLUS
1000.0000 mL | Freq: Once | INTRAVENOUS | Status: AC
  Administered 2022-06-30: 1000 mL via INTRAVENOUS

## 2022-06-30 MED ORDER — MAGNESIUM SULFATE 2 GM/50ML IV SOLN
2.0000 g | Freq: Once | INTRAVENOUS | Status: AC
  Administered 2022-06-30: 2 g via INTRAVENOUS
  Filled 2022-06-30: qty 50

## 2022-06-30 MED ORDER — PROPOFOL 10 MG/ML IV BOLUS
INTRAVENOUS | Status: AC | PRN
  Administered 2022-06-30: 50 mg via INTRAVENOUS

## 2022-06-30 NOTE — ED Triage Notes (Signed)
Pt had lung transplant in April ,post op he went into afib, medication  failed andhad to be cardioverted. For about a week has been feeling like his heart if fluttery. He has been in touch with [facility] transplant coordinator who suggested ER evaluation. Pt does state low energy.

## 2022-06-30 NOTE — ED Notes (Signed)
Verbal from MD, start and then save the rest of the for when the sedation occurs.

## 2022-06-30 NOTE — ED Provider Notes (Signed)
[facility] EMERGENCY DEPT Provider Note   CSN: [id number] Arrival date & time: 06/30/22  1203     History  Chief Complaint  Patient presents with   Atrial Fibrillation    Brent Holloway is a 66 y.o. male.  66 yo M with a chief complaints of not feeling well.  It feels like the last time he was in A-fib.  Has been having this off and on for the past week.  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  He was told that this was not uncommon.  Has been cardioverted twice.  Tried medications for this previously without improvement.  He denies recent illness.  He still having some pain to his postoperative scar mostly on the right than the left.  Denies cough congestion or fever.  Feels like he has been eating and drinking normally.  No leg swelling.   Atrial Fibrillation       Home Medications Prior to Admission medications   Medication Sig Start Date End Date Taking? Authorizing Provider  apixaban (ELIQUIS) 5 MG TABS tablet Take 5 mg by mouth 2 (two) times daily.   Yes [provider]  Adair Patter [phone] MCG/ACT AEPB USE 1 INHALATION BY MOUTH  DAILY 08/09/21   Margaretha Seeds, MD  Multiple Vitamins-Minerals (MULTIVITAMIN WITH MINERALS) tablet Take 1 tablet by mouth daily.    [provider]  Nintedanib (OFEV) 150 MG CAPS TAKE 1 CAPSULE BY MOUTH  TWICE A DAY 12 HOURS APART  WITH FOOD 11/23/20   Margaretha Seeds, MD  Omega-3 Fatty Acids (FISH OIL) 1000 MG CAPS Take 1 capsule by mouth daily.    [provider]  omeprazole (PRILOSEC) 20 MG capsule TAKE 1 CAPSULE BY MOUTH  DAILY 02/06/21   Margaretha Seeds, MD  XARELTO 20 MG TABS tablet Take 20 mg by mouth daily. 01/19/17   [provider]      Allergies    Patient has no known allergies.    Review of Systems   Review of Systems  Physical Exam Updated Vital Signs BP (!) 133/90   Pulse 72   Temp 98.2 F (36.8 C)   Resp 20   Ht 5\' 11"  (1.803  m)   Wt 111 kg   SpO2 100%   BMI 34.13 kg/m  Physical Exam Vitals and nursing note reviewed.  Constitutional:      Appearance: He is well-developed.  HENT:     Head: Normocephalic and atraumatic.  Eyes:     Pupils: Pupils are equal, round, and reactive to light.  Neck:     Vascular: No JVD.  Cardiovascular:     Rate and Rhythm: Tachycardia present. Rhythm irregular.     Heart sounds: No murmur heard.    No friction rub. No gallop.     Comments: Some mild pain along his surgical scar at the base of the right chest wall.  No obvious erythema warmth fluctuance or induration. Pulmonary:     Effort: No respiratory distress.     Breath sounds: No wheezing.  Abdominal:     General: There is no distension.     Tenderness: There is no abdominal tenderness. There is no guarding or rebound.  Musculoskeletal:        General: Normal range of motion.     Cervical back: Normal range of motion and neck supple.  Skin:    Coloration: Skin is not pale.     Findings:  No rash.  Neurological:     Mental Status: He is alert and oriented to person, place, and time.  Psychiatric:        Behavior: Behavior normal.     ED Results / Procedures / Treatments   Labs (all labs ordered are listed, but only abnormal results are displayed) Labs Reviewed  CBC WITH DIFFERENTIAL/PLATELET - Abnormal; Notable for the following components:      Result Value   WBC 11.7 (*)    HCT 38.8 (*)    Neutro Abs 10.1 (*)    Lymphs Abs 0.5 (*)    Abs Immature Granulocytes 0.19 (*)    All other components within normal limits  BASIC METABOLIC PANEL - Abnormal; Notable for the following components:   Glucose, Bld 130 (*)    BUN 38 (*)    Creatinine, Ser 1.82 (*)    Calcium 10.4 (*)    GFR, Estimated 40 (*)    All other components within normal limits  MAGNESIUM - Abnormal; Notable for the following components:   Magnesium 1.3 (*)    All other components within normal limits    EKG EKG  Interpretation  Date/Time:  Monday June 30 2022 12:15:17 EST Ventricular Rate:  117 PR Interval:    QRS Duration: 74 QT Interval:  342 QTC Calculation: 477 R Axis:   -13 Text Interpretation: Atrial flutter with variable A-V block Nonspecific ST abnormality Abnormal ECG No old tracing to compare Confirmed by Deno Etienne [phone]) on 06/30/2022 12:20:22 PM  Radiology DG Chest Port 1 View  Result Date: 06/30/2022 CLINICAL DATA:  Atrial fibrillation. Fatigue with shortness of breath for 1 week. Previous bilateral lung transplant 9 months ago. EXAM: PORTABLE CHEST 1 VIEW COMPARISON:  Chest radiographs 01/13/2019.  CT 01/09/2019. FINDINGS: 1238 hours. Interval postsurgical changes consistent with bilateral lung transplant. The heart size and mediastinal contours are stable. There are low lung volumes with probable mild atelectasis at both lung bases. There are probable postsurgical changes in the left lung base. No edema, confluent airspace opacity, pleural effusion or pneumothorax. No acute osseous findings are evident. Telemetry leads overlie the chest. IMPRESSION: Interval postsurgical changes consistent with bilateral lung transplant. Probable mild bibasilar atelectasis. No other acute cardiopulmonary process. Electronically Signed   By: Richardean Sale M.D.   On: 06/30/2022 12:53    Procedures .Cardioversion  Date/Time: 06/30/2022 3:09 PM  Performed by: Deno Etienne, DO Authorized by: Deno Etienne, DO   Consent:    Consent obtained:  Verbal   Consent given by:  Patient   Risks discussed:  Cutaneous burn, death and induced arrhythmia Pre-procedure details:    Cardioversion basis:  Emergent   Rhythm:  Atrial fibrillation   Electrode placement:  Anterior-posterior Patient sedated: Yes. Refer to sedation procedure documentation for details of sedation.  Attempt one:    Cardioversion mode:  Synchronous   Waveform:  Biphasic   Shock (Joules):  200   Shock outcome:  Conversion to normal  sinus rhythm Post-procedure details:    Patient status:  Alert   Patient tolerance of procedure:  Tolerated well, no immediate complications .Sedation  Date/Time: 06/30/2022 3:10 PM  Performed by: Deno Etienne, DO Authorized by: Deno Etienne, DO   Consent:    Consent obtained:  Verbal and written   Consent given by:  Patient   Risks discussed:  Allergic reaction, dysrhythmia, inadequate sedation, nausea, vomiting, respiratory compromise necessitating ventilatory assistance and intubation and prolonged hypoxia resulting in organ damage   Alternatives discussed:  Analgesia without sedation Universal protocol:    Procedure explained and questions answered to patient or proxy's satisfaction: yes     Immediately prior to procedure, a time out was called: yes     Patient identity confirmed:  Anonymous protocol, patient vented/unresponsive and arm band Indications:    Procedure necessitating sedation performed by:  Physician performing sedation Pre-sedation assessment:    Time since last food or drink:  1   ASA classification: class 2 - patient with mild systemic disease     Mouth opening:  2 finger widths   Thyromental distance:  3 finger widths   Mallampati score:  II - soft palate, uvula, fauces visible   Neck mobility: normal     Pre-sedation assessments completed and reviewed: airway patency, cardiovascular function, hydration status, mental status, nausea/vomiting, pain level, respiratory function and temperature   Immediate pre-procedure details:    Reassessment: Patient reassessed immediately prior to procedure     Reviewed: vital signs     Verified: bag valve mask available, emergency equipment available, intubation equipment available, IV patency confirmed, oxygen available and suction available   Procedure details (see MAR for exact dosages):    Preoxygenation:  Nasal cannula   Sedation:  Propofol   Intended level of sedation: moderate (conscious sedation)   Analgesia:  None    Intra-procedure monitoring:  Cardiac monitor, blood pressure monitoring, continuous capnometry, continuous pulse oximetry, frequent LOC assessments and frequent vital sign checks   Intra-procedure events: none     Total Provider sedation time (minutes):  35 Post-procedure details:    Attendance: Constant attendance by certified staff until patient recovered     Recovery: Patient returned to pre-procedure baseline     Post-sedation assessments completed and reviewed: airway patency, cardiovascular function, hydration status, mental status, nausea/vomiting, pain level, respiratory function and temperature     Patient is stable for discharge or admission: yes     Procedure completion:  Tolerated well, no immediate complications     Medications Ordered in ED Medications  propofol (DIPRIVAN) 10 mg/mL bolus/IV push 55.5 mg (has no administration in time range)  sodium chloride 0.9 % bolus 1,000 mL (1,000 mLs Intravenous New Bag/Given 06/30/22 1313)  magnesium sulfate IVPB 2 g 50 mL (2 g Intravenous New Bag/Given 06/30/22 1407)  propofol (DIPRIVAN) 10 mg/mL bolus/IV push (50 mg Intravenous Given 06/30/22 1403)    ED Course/ Medical Decision Making/ A&P Clinical Course as of 06/30/22 1510  Mon Jun 30, 2022  1502 Stable 66 YOM with afib. S/P Lung transplant getting mag and DC [CC]    Clinical Course User Index [CC] Glyn Ade, MD                           Medical Decision Making Amount and/or Complexity of Data Reviewed Labs: ordered. Radiology: ordered.  Risk Prescription drug management.   66 yo M with a significant past medical history of lung transplantation here with a chief complaint of atrial fibrillation.  He has been having symptoms off and on for the past week.  Feels like it has been much more persistent recently.  Had trials of outpatient medications without resolution in the past and required cardioversion.  He states has been compliant with his Eliquis.  No other  obvious cause to go into A-fib clinically.  Will attempt to sedate and cardiovert.   CRITICAL CARE Performed by: Rae Roam   Total critical care time: 35 minutes  Critical care  time was exclusive of separately billable procedures and treating other patients.  Critical care was necessary to treat or prevent imminent or life-threatening deterioration.  Critical care was time spent personally by me on the following activities: development of treatment plan with patient and/or surrogate as well as nursing, discussions with consultants, evaluation of patient's response to treatment, examination of patient, obtaining history from patient or surrogate, ordering and performing treatments and interventions, ordering and review of laboratory studies, ordering and review of radiographic studies, pulse oximetry and re-evaluation of patient's condition.  CHA2DS2/VAS Stroke Risk Points      N/A >= 2 Points: High Risk  1 - 1.99 Points: Medium Risk  0 Points: Low Risk    Last Change: N/A      This score determines the patient's risk of having a stroke if the  patient has atrial fibrillation.      This score is not applicable to this patient. Components are not  calculated.     The patients results and plan were reviewed and discussed.   Any x-rays performed were independently reviewed by myself.   Differential diagnosis were considered with the presenting HPI.  Medications  propofol (DIPRIVAN) 10 mg/mL bolus/IV push 55.5 mg (has no administration in time range)  sodium chloride 0.9 % bolus 1,000 mL (1,000 mLs Intravenous New Bag/Given 06/30/22 1313)  magnesium sulfate IVPB 2 g 50 mL (2 g Intravenous New Bag/Given 06/30/22 1407)  propofol (DIPRIVAN) 10 mg/mL bolus/IV push (50 mg Intravenous Given 06/30/22 1403)    Vitals:   06/30/22 1411 06/30/22 1415 06/30/22 1445 06/30/22 1500  BP: (!) 144/100 (!) 126/95 (!) 131/94 (!) 133/90  Pulse: 75 75 76 72  Resp: (!) 25 20 20 20   Temp:       SpO2: 100% 100% 100% 100%  Weight:      Height:        Final diagnoses:  Atrial fibrillation with rapid ventricular response (HCC)          Final Clinical Impression(s) / ED Diagnoses Final diagnoses:  Atrial fibrillation with rapid ventricular response ([place])    Rx / DC Orders ED Discharge Orders          Ordered    Ambulatory referral to Cardiology       Comments: If you have not heard from the Cardiology office within the next 72 hours please call [phone].   06/30/22 Sprague, [place], DO 06/30/22 1510

## 2022-06-30 NOTE — Sedation Documentation (Signed)
Pt cardioverted at 200J

## 2022-06-30 NOTE — ED Notes (Signed)
Provider aware of the need to wait for additional RN's.

## 2022-06-30 NOTE — Discharge Instructions (Addendum)
Please call the A-fib clinic today to try and set up an appointment this week.

## 2022-07-01 ENCOUNTER — Inpatient Hospital Stay (HOSPITAL_BASED_OUTPATIENT_CLINIC_OR_DEPARTMENT_OTHER)
Admission: EM | Admit: 2022-07-01 | Discharge: 2022-07-03 | DRG: 205 | Disposition: A | Discharge status: Home / Self Care | Payer: Medicare Other | Attending: Internal Medicine | Admitting: Internal Medicine

## 2022-07-01 ENCOUNTER — Encounter (HOSPITAL_BASED_OUTPATIENT_CLINIC_OR_DEPARTMENT_OTHER): Payer: Self-pay

## 2022-07-01 ENCOUNTER — Encounter (HOSPITAL_COMMUNITY): Payer: Self-pay

## 2022-07-01 ENCOUNTER — Emergency Department (HOSPITAL_BASED_OUTPATIENT_CLINIC_OR_DEPARTMENT_OTHER): Payer: Medicare Other

## 2022-07-01 ENCOUNTER — Other Ambulatory Visit: Payer: Self-pay

## 2022-07-01 ENCOUNTER — Telehealth (HOSPITAL_COMMUNITY): Payer: Self-pay | Admitting: *Deleted

## 2022-07-01 DIAGNOSIS — T86812 Lung transplant infection: Principal | ICD-10-CM | POA: Diagnosis present

## 2022-07-01 DIAGNOSIS — Z7951 Long term (current) use of inhaled steroids: Secondary | ICD-10-CM

## 2022-07-01 DIAGNOSIS — J988 Other specified respiratory disorders: Secondary | ICD-10-CM

## 2022-07-01 DIAGNOSIS — Z9049 Acquired absence of other specified parts of digestive tract: Secondary | ICD-10-CM

## 2022-07-01 DIAGNOSIS — Z20822 Contact with and (suspected) exposure to covid-19: Secondary | ICD-10-CM | POA: Diagnosis present

## 2022-07-01 DIAGNOSIS — D6851 Activated protein C resistance: Secondary | ICD-10-CM | POA: Diagnosis present

## 2022-07-01 DIAGNOSIS — Z79899 Other long term (current) drug therapy: Secondary | ICD-10-CM

## 2022-07-01 DIAGNOSIS — Z87891 Personal history of nicotine dependence: Secondary | ICD-10-CM

## 2022-07-01 DIAGNOSIS — I10 Essential (primary) hypertension: Secondary | ICD-10-CM | POA: Diagnosis present

## 2022-07-01 DIAGNOSIS — I82501 Chronic embolism and thrombosis of unspecified deep veins of right lower extremity: Secondary | ICD-10-CM | POA: Diagnosis present

## 2022-07-01 DIAGNOSIS — D849 Immunodeficiency, unspecified: Secondary | ICD-10-CM

## 2022-07-01 DIAGNOSIS — Y830 Surgical operation with transplant of whole organ as the cause of abnormal reaction of the patient, or of later complication, without mention of misadventure at the time of the procedure: Secondary | ICD-10-CM | POA: Diagnosis present

## 2022-07-01 DIAGNOSIS — Z6834 Body mass index (BMI) 34.0-34.9, adult: Secondary | ICD-10-CM

## 2022-07-01 DIAGNOSIS — I129 Hypertensive chronic kidney disease with stage 1 through stage 4 chronic kidney disease, or unspecified chronic kidney disease: Secondary | ICD-10-CM | POA: Diagnosis present

## 2022-07-01 DIAGNOSIS — R053 Chronic cough: Secondary | ICD-10-CM | POA: Diagnosis present

## 2022-07-01 DIAGNOSIS — D84821 Immunodeficiency due to drugs: Secondary | ICD-10-CM | POA: Diagnosis present

## 2022-07-01 DIAGNOSIS — N1832 Chronic kidney disease, stage 3b: Secondary | ICD-10-CM | POA: Diagnosis present

## 2022-07-01 DIAGNOSIS — Z7960 Long term (current) use of unspecified immunomodulators and immunosuppressants: Secondary | ICD-10-CM

## 2022-07-01 DIAGNOSIS — I480 Paroxysmal atrial fibrillation: Secondary | ICD-10-CM | POA: Diagnosis present

## 2022-07-01 DIAGNOSIS — Z942 Lung transplant status: Secondary | ICD-10-CM

## 2022-07-01 DIAGNOSIS — Z8249 Family history of ischemic heart disease and other diseases of the circulatory system: Secondary | ICD-10-CM

## 2022-07-01 DIAGNOSIS — J189 Pneumonia, unspecified organism: Secondary | ICD-10-CM | POA: Diagnosis present

## 2022-07-01 DIAGNOSIS — E6609 Other obesity due to excess calories: Secondary | ICD-10-CM

## 2022-07-01 DIAGNOSIS — Z7901 Long term (current) use of anticoagulants: Secondary | ICD-10-CM

## 2022-07-01 LAB — CBC
HCT: 32.9 % — ABNORMAL LOW (ref 39.0–52.0)
Hemoglobin: 11.7 g/dL — ABNORMAL LOW (ref 13.0–17.0)
MCH: 30.8 pg (ref 26.0–34.0)
MCHC: 35.6 g/dL (ref 30.0–36.0)
MCV: 86.6 fL (ref 80.0–100.0)
Platelets: 178 10*3/uL (ref 150–400)
RBC: 3.8 MIL/uL — ABNORMAL LOW (ref 4.22–5.81)
RDW: 14.4 % (ref 11.5–15.5)
WBC: 9.8 10*3/uL (ref 4.0–10.5)
nRBC: 0 % (ref 0.0–0.2)

## 2022-07-01 LAB — CBC WITH DIFFERENTIAL/PLATELET
Abs Immature Granulocytes: 0.17 10*3/uL — ABNORMAL HIGH (ref 0.00–0.07)
Basophils Absolute: 0 10*3/uL (ref 0.0–0.1)
Basophils Relative: 0 %
Eosinophils Absolute: 0 10*3/uL (ref 0.0–0.5)
Eosinophils Relative: 0 %
HCT: 36.6 % — ABNORMAL LOW (ref 39.0–52.0)
Hemoglobin: 13 g/dL (ref 13.0–17.0)
Immature Granulocytes: 2 %
Lymphocytes Relative: 3 %
Lymphs Abs: 0.4 10*3/uL — ABNORMAL LOW (ref 0.7–4.0)
MCH: 31.1 pg (ref 26.0–34.0)
MCHC: 35.5 g/dL (ref 30.0–36.0)
MCV: 87.6 fL (ref 80.0–100.0)
Monocytes Absolute: 0.8 10*3/uL (ref 0.1–1.0)
Monocytes Relative: 7 %
Neutro Abs: 9.6 10*3/uL — ABNORMAL HIGH (ref 1.7–7.7)
Neutrophils Relative %: 88 %
Platelets: 195 10*3/uL (ref 150–400)
RBC: 4.18 MIL/uL — ABNORMAL LOW (ref 4.22–5.81)
RDW: 14.6 % (ref 11.5–15.5)
WBC: 11 10*3/uL — ABNORMAL HIGH (ref 4.0–10.5)
nRBC: 0 % (ref 0.0–0.2)

## 2022-07-01 LAB — RESP PANEL BY RT-PCR (RSV, FLU A&B, COVID)  RVPGX2
Influenza A by PCR: NEGATIVE
Influenza B by PCR: NEGATIVE
Resp Syncytial Virus by PCR: NEGATIVE
SARS Coronavirus 2 by RT PCR: NEGATIVE

## 2022-07-01 LAB — COMPREHENSIVE METABOLIC PANEL
ALT: 8 U/L (ref 0–44)
AST: 13 U/L — ABNORMAL LOW (ref 15–41)
Albumin: 4.3 g/dL (ref 3.5–5.0)
Alkaline Phosphatase: 49 U/L (ref 38–126)
Anion gap: 13 (ref 5–15)
BUN: 38 mg/dL — ABNORMAL HIGH (ref 8–23)
CO2: 20 mmol/L — ABNORMAL LOW (ref 22–32)
Calcium: 9.4 mg/dL (ref 8.9–10.3)
Chloride: 102 mmol/L (ref 98–111)
Creatinine, Ser: 1.85 mg/dL — ABNORMAL HIGH (ref 0.61–1.24)
GFR, Estimated: 40 mL/min — ABNORMAL LOW (ref >60)
Glucose, Bld: 125 mg/dL — ABNORMAL HIGH (ref 70–99)
Potassium: 4.3 mmol/L (ref 3.5–5.1)
Sodium: 135 mmol/L (ref 135–145)
Total Bilirubin: 1.4 mg/dL — ABNORMAL HIGH (ref 0.3–1.2)
Total Protein: 6.4 g/dL — ABNORMAL LOW (ref 6.5–8.1)

## 2022-07-01 LAB — RESPIRATORY PANEL BY PCR

## 2022-07-01 LAB — LIPASE, BLOOD: Lipase: 14 U/L (ref 11–51)

## 2022-07-01 LAB — MAGNESIUM: Magnesium: 1.4 mg/dL — ABNORMAL LOW (ref 1.7–2.4)

## 2022-07-01 LAB — TROPONIN I (HIGH SENSITIVITY): Troponin I (High Sensitivity): 20 ng/L — ABNORMAL HIGH (ref <18)

## 2022-07-01 LAB — BRAIN NATRIURETIC PEPTIDE: B Natriuretic Peptide: 508.3 pg/mL — ABNORMAL HIGH (ref 0.0–100.0)

## 2022-07-01 MED ORDER — PIPERACILLIN-TAZOBACTAM 3.375 G IVPB 30 MIN
3.3750 g | Freq: Once | INTRAVENOUS | Status: AC
  Administered 2022-07-01: 3.375 g via INTRAVENOUS
  Filled 2022-07-01: qty 50

## 2022-07-01 MED ORDER — SODIUM CHLORIDE 0.9 % IV SOLN: 1.0000 g | Freq: Once | INTRAVENOUS | Status: DC

## 2022-07-01 MED ORDER — ALBUTEROL SULFATE HFA 108 (90 BASE) MCG/ACT IN AERS
2.0000 | INHALATION_SPRAY | Freq: Once | RESPIRATORY_TRACT | Status: AC
  Administered 2022-07-01: 2 via RESPIRATORY_TRACT
  Filled 2022-07-01: qty 6.7

## 2022-07-01 MED ORDER — VANCOMYCIN HCL IN DEXTROSE 1-5 GM/200ML-% IV SOLN: 1000.0000 mg | INTRAVENOUS | Status: DC

## 2022-07-01 MED ORDER — SODIUM CHLORIDE 0.9 % IV SOLN: INTRAVENOUS | Status: DC | PRN

## 2022-07-01 MED ORDER — MAGNESIUM SULFATE 2 GM/50ML IV SOLN
2.0000 g | Freq: Once | INTRAVENOUS | Status: AC
  Administered 2022-07-01: 2 g via INTRAVENOUS
  Filled 2022-07-01: qty 50

## 2022-07-01 MED ORDER — VANCOMYCIN HCL 1500 MG/300ML IV SOLN: 1500.0000 mg | Freq: Once | INTRAVENOUS | Status: DC

## 2022-07-01 MED ORDER — AEROCHAMBER PLUS FLO-VU MISC
1.0000 | Freq: Once | Status: AC
  Administered 2022-07-01: 1
  Filled 2022-07-01: qty 1

## 2022-07-01 MED ORDER — PIPERACILLIN-TAZOBACTAM 3.375 G IVPB
3.3750 g | Freq: Three times a day (TID) | INTRAVENOUS | Status: DC
  Administered 2022-07-02 – 2022-07-03 (×4): 3.375 g via INTRAVENOUS
  Filled 2022-07-01 (×4): qty 50

## 2022-07-01 MED ORDER — VANCOMYCIN HCL IN DEXTROSE 1-5 GM/200ML-% IV SOLN
1000.0000 mg | INTRAVENOUS | Status: AC
  Administered 2022-07-01 (×2): 1000 mg via INTRAVENOUS
  Filled 2022-07-01 (×2): qty 200

## 2022-07-01 NOTE — ED Provider Notes (Signed)
[name] EMERGENC[facility] DEPT Provider Note   CSN: [id number] Arrival date & time: 07/01/22  1232     History  Chief Complaint  Patient presents with   Shortness of Breath    Brent Holloway is a 65 y.o. male.  66 yo M with a chief complaint of difficulty breathing.  He was actually seen in the emergency room yesterday by myself and was suffering from shortness of breath and fatigue that was thought to be secondary to atrial fibrillation.  He was cardioverted here and felt much better and was discharged home.  He tells me he felt well until later that afternoon and then developed a cough which persisted throughout the night that had him have some difficulty sleeping and woke up this morning with some persistent cough.  Coughing so much that he feels like sometimes he might vomit at the end of it.  He denies any fevers or chills.  He did try to check his oxygen saturation at home and noted that it was a bit lower than normal for him.  No known sick contacts. They called their transplant physician who suggested they come to the ED for evaluation.  They are requesting a respiratory virus panel.    Shortness of Breath      Home Medications Prior to Admission medications   Medication Sig Start Date End Date Taking? Authorizing Provider  apixaban (ELIQUIS) 5 MG TABS tablet Take 5 mg by mouth 2 (two) times daily.    [provider]  Adair Patter [phone] MCG/ACT AEPB USE 1 INHALATION BY MOUTH  DAILY 08/09/21   Margaretha Seeds, MD  Multiple Vitamins-Minerals (MULTIVITAMIN WITH MINERALS) tablet Take 1 tablet by mouth daily.    [provider]  Nintedanib (OFEV) 150 MG CAPS TAKE 1 CAPSULE BY MOUTH  TWICE A DAY 12 HOURS APART  WITH FOOD 11/23/20   Margaretha Seeds, MD  Omega-3 Fatty Acids (FISH OIL) 1000 MG CAPS Take 1 capsule by mouth daily.    [provider]  omeprazole (PRILOSEC) 20 MG capsule TAKE 1 CAPSULE BY MOUTH  DAILY 02/06/21   Margaretha Seeds, MD   XARELTO 20 MG TABS tablet Take 20 mg by mouth daily. 01/19/17   [provider]      Allergies    Patient has no known allergies.    Review of Systems   Review of Systems  Respiratory:  Positive for shortness of breath.     Physical Exam Updated Vital Signs BP (!) 141/94   Pulse 73   Temp 98.5 F (36.9 C)   Resp 19   Ht 5\' 11"  (1.803 m)   Wt 111 kg   SpO2 96%   BMI 34.13 kg/m  Physical Exam Vitals and nursing note reviewed.  Constitutional:      Appearance: He is well-developed.  HENT:     Head: Normocephalic and atraumatic.  Eyes:     Pupils: Pupils are equal, round, and reactive to light.  Neck:     Vascular: No JVD.  Cardiovascular:     Rate and Rhythm: Normal rate and regular rhythm.     Heart sounds: No murmur heard.    No friction rub. No gallop.  Pulmonary:     Effort: No respiratory distress.     Breath sounds: Wheezing present.     Comments: End expiratory wheezes in all fields. Abdominal:     General: There is no distension.     Tenderness: There is no abdominal tenderness.  There is no guarding or rebound.  Musculoskeletal:        General: Normal range of motion.     Cervical back: Normal range of motion and neck supple.  Skin:    Coloration: Skin is not pale.     Findings: No rash.  Neurological:     Mental Status: He is alert and oriented to person, place, and time.  Psychiatric:        Behavior: Behavior normal.     ED Results / Procedures / Treatments   Labs (all labs ordered are listed, but only abnormal results are displayed) Labs Reviewed  CBC WITH DIFFERENTIAL/PLATELET - Abnormal; Notable for the following components:      Result Value   WBC 11.0 (*)    RBC 4.18 (*)    HCT 36.6 (*)    Neutro Abs 9.6 (*)    Lymphs Abs 0.4 (*)    Abs Immature Granulocytes 0.17 (*)    All other components within normal limits  COMPREHENSIVE METABOLIC PANEL - Abnormal; Notable for the following components:   CO2 20 (*)    Glucose, Bld 125  (*)    BUN 38 (*)    Creatinine, Ser 1.85 (*)    Total Protein 6.4 (*)    AST 13 (*)    Total Bilirubin 1.4 (*)    GFR, Estimated 40 (*)    All other components within normal limits  BRAIN NATRIURETIC PEPTIDE - Abnormal; Notable for the following components:   B Natriuretic Peptide 508.3 (*)    All other components within normal limits  MAGNESIUM - Abnormal; Notable for the following components:   Magnesium 1.4 (*)    All other components within normal limits  TROPONIN I (HIGH SENSITIVITY) - Abnormal; Notable for the following components:   Troponin I (High Sensitivity) 20 (*)    All other components within normal limits  RESP PANEL BY RT-PCR (RSV, FLU A&B, COVID)  RVPGX2  RESPIRATORY PANEL BY PCR  LIPASE, BLOOD    EKG EKG Interpretation  Date/Time:  Tuesday July 01 2022 12:37:58 EST Ventricular Rate:  76 PR Interval:  170 QRS Duration: 82 QT Interval:  398 QTC Calculation: 447 R Axis:   -3 Text Interpretation: Normal sinus rhythm Anterior infarct , age undetermined Abnormal ECG No significant change since last tracing Confirmed by Deno Etienne [phone]) on 07/01/2022 12:40:48 PM  Radiology DG Chest Port 1 View  Result Date: 07/01/2022 CLINICAL DATA:  Shortness of breath and cough. Cardioversion yesterday for atrial fibrillation. EXAM: PORTABLE CHEST 1 VIEW COMPARISON:  Multiple exams, including 06/30/2022 12:38 p.m. FINDINGS: Again noted are numerous clips along the central mediastinum and findings characteristic for prior lung transplant. Right pleural thickening and blunting the right costophrenic angle, cannot exclude right pleural effusion. There is some progressive opacity at the right lung base probably from atelectasis. Mild interstitial accentuation and indistinctness of the pulmonary vasculature, mild edema is not excluded. Compared to the exam from 1 day ago, there is some faint left perihilar opacity including a 1.4 by 2.2 cm nodular opacity which, being new, is most  likely due to edema or infection Indistinctness along the left heart border may reflect mild lingular atelectasis. Suspected enlargement of the cardiopericardial silhouette. IMPRESSION: 1. New 1.4 x 2.2 cm nodular opacity in the left perihilar region, most likely due to edema or infection. 2. Progressive opacity at the right lung base, probably from atelectasis. 3. Suspected enlargement of the cardiopericardial silhouette with mild interstitial accentuation and indistinctness  of the pulmonary vasculature, favoring interstitial edema. 4. Right pleural thickening and blunting of the right costophrenic angle, probable small right pleural effusion. 5. Postoperative findings characteristic for prior lung transplant. Electronically Signed   By: Gaylyn Rong M.D.   On: 07/01/2022 13:12   DG Chest Port 1 View  Result Date: 06/30/2022 CLINICAL DATA:  Atrial fibrillation. Fatigue with shortness of breath for 1 week. Previous bilateral lung transplant 9 months ago. EXAM: PORTABLE CHEST 1 VIEW COMPARISON:  Chest radiographs 01/13/2019.  CT 01/09/2019. FINDINGS: 1238 hours. Interval postsurgical changes consistent with bilateral lung transplant. The heart size and mediastinal contours are stable. There are low lung volumes with probable mild atelectasis at both lung bases. There are probable postsurgical changes in the left lung base. No edema, confluent airspace opacity, pleural effusion or pneumothorax. No acute osseous findings are evident. Telemetry leads overlie the chest. IMPRESSION: Interval postsurgical changes consistent with bilateral lung transplant. Probable mild bibasilar atelectasis. No other acute cardiopulmonary process. Electronically Signed   By: Carey Bullocks M.D.   On: 06/30/2022 12:53    Procedures Procedures    Medications Ordered in ED Medications  magnesium sulfate IVPB 2 g 50 mL (2 g Intravenous New Bag/Given 07/01/22 1512)  0.9 %  sodium chloride infusion ( Intravenous New  Bag/Given 07/01/22 1511)  albuterol (VENTOLIN HFA) 108 (90 Base) MCG/ACT inhaler 2 puff (2 puffs Inhalation Given 07/01/22 1336)  aerochamber plus with mask device 1 each (1 each Other Given 07/01/22 1336)    ED Course/ Medical Decision Making/ A&P Clinical Course as of 07/01/22 1518  Tue Jul 01, 2022  1502 Stable 66YOM with a chief complaint of malaise. Cardioverted yesterday.  On 5mg  daily coughing freqently. Hx of lung transplant. Therapy question  S/P 2020s lung transplant march of this year.  [CC]    Clinical Course User Index [CC] April, MD                           Medical Decision Making Amount and/or Complexity of Data Reviewed Labs: ordered. Radiology: ordered.  Risk Prescription drug management.   66 yo M with a chief complaints of cough and congestion.  Patient was actually seen yesterday by myself was in A-fib thought to be the cause of his symptoms was cardioverted and was discharged home.  He had been doing well and then had developed a cough later that evening.  He has a history of lung transplant and had called his transplant doctors suggested that he come in for an evaluation and viral testing.  He is wheezing here.  Will give a dose of albuterol.  Chest x-ray blood work reassess.CXR without obvious change on my independent interpretation showing.  His troponin is 20, BNP 500.  Not obviously clinically fluid overloaded.  He was wheezing and has had significant improvement with 2 puffs of albuterol.  Persistent leukocytosis without significant change.  I discussed results with the patient and family.  He is doing much better.  Possibly a bronchospastic process.  I discussed with them about perhaps increasing his steroids.  They would appreciate if I talked it over with the pulmonologist on-call at [facility].  Call placed.   At this time I probably would start him on azithromycin as he was having some symptoms that were somewhat consistent with pertussis,  will also consider Keflex for community-acquired coverage.  Patient care was signed out to Dr. [facility], please see his note for further details in  the ED.   The patients results and plan were reviewed and discussed.   Any x-rays performed were independently reviewed by myself.   Differential diagnosis were considered with the presenting HPI.  Medications  magnesium sulfate IVPB 2 g 50 mL (2 g Intravenous New Bag/Given 07/01/22 1512)  0.9 %  sodium chloride infusion ( Intravenous New Bag/Given 07/01/22 1511)  albuterol (VENTOLIN HFA) 108 (90 Base) MCG/ACT inhaler 2 puff (2 puffs Inhalation Given 07/01/22 1336)  aerochamber plus with mask device 1 each (1 each Other Given 07/01/22 1336)    Vitals:   07/01/22 1234 07/01/22 1238 07/01/22 1330  BP:  (!) 141/91 (!) 141/94  Pulse:  75 73  Resp:  (!) 22 19  Temp:  98.5 F (36.9 C)   SpO2:  94% 96%  Weight: 111 kg    Height: 5\' 11"  (1.803 m)      Final diagnoses:  Wheezing-associated respiratory infection (WARI)    Admission/ observation were discussed with the admitting physician, patient and/or family and they are comfortable with the plan.            Final Clinical Impression(s) / ED Diagnoses Final diagnoses:  Wheezing-associated respiratory infection (WARI)    Rx / DC Orders ED Discharge Orders     None         Deno Etienne, DO 07/01/22 1518

## 2022-07-01 NOTE — Progress Notes (Addendum)
Pharmacy Antibiotic Note  Brent Holloway is a 66 y.o. male admitted on 07/01/2022 with pneumonia.  Pharmacy has been consulted for Zosyn (transition to Cefepime on transfer for admission) and Vancomycin dosing.  WBC 11.0, Tmax 98.5 SCr 1.85 (baseline ~1.3-1.4?) Pt received Zosyn 3.375g IV x1 in ED at 18:51 on 07/01/22   Cefepime unable to be given at [facility] due to lack of availability. Zosyn initiated at [facility] until Cefepime can be provided to pt on admission.  Plan: Initiate loading dose of Vancomycin 2000mg  IV x 1, followed by  Vancomycin 1000mg  IV q24h (eAUC ~460)    > Goal AUC 400-550    > Check vancomycin levels at steady state  Continue Zosyn 3.375g IV q8h extended infusion starting at 01:00 on 07/02/22 until pt is able to be transferred to hospital with cefepime supply. Transition to Cefepime 2g IV q12h upon transfer for admission Monitor daily CBC, temp, SCr, and for clinical signs of improvement  F/u cultures and de-escalate antibiotics as able   Height: 5\' 11"  (180.3 cm) Weight: 111 kg (244 lb 11.4 oz) IBW/kg (Calculated) : 75.3  Temp (24hrs), Avg:98.3 F (36.8 C), Min:98.1 F (36.7 C), Max:98.5 F (36.9 C)  Recent Labs  Lab 06/30/22 1229 07/01/22 1300  WBC 11.7* 11.0*  CREATININE 1.82* 1.85*    Estimated Creatinine Clearance: 49.8 mL/min (A) (by C-G formula based on SCr of 1.85 mg/dL (H)).    No Known Allergies  Antimicrobials this admission: Zosyn 12/12 >>  Vancomycin 12/12 >>   Dose adjustments this admission: N/A  Microbiology results: 12/12 BCx: pending 12/12 Sputum: pending  12/12 MRSA PCR: ordered  Thank you for allowing pharmacy to be a part of this patient's care.  14/11/23, PharmD, BCPS Clinical Pharmacist 07/01/2022 8:41 PM   Please refer to [facility] for pharmacy phone number

## 2022-07-01 NOTE — Telephone Encounter (Signed)
Er follow up declined. See below

## 2022-07-01 NOTE — Plan of Care (Signed)
    Patient Name: Brent Holloway, Brent Holloway DOB: 1955-10-05 MRN: [id number] Transferring facility: [facility] Requesting provider: Doran Durand, md Reason for transfer:  66 yo WM with hx of lung transplant, 3 days of SOB, in afib yesterday. cardioverted in ER yesterday. persistent cough. left perihilar opacity new on CXR.  follows at [facility]. discussed with EDP that [place] pul[facility]ology needs to agree to see this patient on a daily basis and actively manage his transplant meds/issues. Admission deferred until [facility] pulmonology agrees to see patient in consult and actively manage patient.  **update. EDP has discussed case with [facility](Rahul, PA). they have agreed to consult and see the patient on a daily basis.   Going to: [facility] Admission Status: observation Bed Type: cardiac tele To Do:  TR[facility] will assume care on arrival to accepting facility. Until arrival, care as per EDP. However, [facility] available 24/7 for questions and assistance.   Nursing staff please page [facility] Admits and Consults [phone]) as soon as the patient arrives to the hospital.  Carollee Herter, DO [facility]

## 2022-07-01 NOTE — ED Notes (Signed)
RT spoke to patient re: CPAP use. Patient states he no longer uses CPAP. On Room Air, SpO2 97%. No distress noted.

## 2022-07-01 NOTE — ED Triage Notes (Signed)
Patient here POV from Home.  Endorses Cough and SOB that was noted today. Seen Yesterday and cardioverted for Atrial Flutter. Successful and Discharged. States SOB has worsened, especially today.  SOB in Triage. A&Ox4. GCS 15. Ambulatory.

## 2022-07-01 NOTE — Telephone Encounter (Signed)
-----   Message from Alcide Clever sent at 07/01/2022  9:53 AM EST ----- Regarding: RE: appt Wife stated that they doing something with [facility] as far as a follow up and stated that she would call back if they decide to proceed,  ----- Message ----- From: Shona Simpson, RN Sent: 07/01/2022   9:35 AM EST To: Alcide Clever Subject: appt                                           Pt needs appt for er follow up in 1 week thanks Kiyona Mcnall

## 2022-07-01 NOTE — ED Notes (Signed)
13:33 - Called [facility], requested for the pulmonologist on call, per MD's request

## 2022-07-01 NOTE — ED Notes (Addendum)
Pt. seen s/p 2 puffs Albuterol Inhaler, resting comfortably with no c/o, wife remains at bedside, made aware off call bell if needed.

## 2022-07-01 NOTE — ED Provider Notes (Signed)
I reevaluated [name] at bedside. In short he is a 66 year old male presenting with a chief complaint of shortness of breath.  He has an extensive medical history including A-fib, hypertension, obesity, idiopathic pulmonary fibrosis status post lung transplant at [facility] in March of this year. He follows with [facility] pulmonology Dr. Rushie Chestnut  He states that he was taken down on his daily steroid dose from 10 mg prednisone daily to 5 mg prednisone daily on the first of this year.  He has noted intervally worsening shortness of breath and frequent cough developing over this past 2 weeks. Was seen yesterday for A-fib and cardioverted. Returns today for recurrent cough and shortness of breath. Objective evaluation today is grossly reassuring.  Kidney function at its baseline, magnesium remains low being replaced at this time. BNP is elevated though x-ray only has a focal consolidation.  Slight leukocytosis white blood cells at 11. Differential is broad and still nonspecific.  This may be a pneumonia in the setting of lung transplant in an immunocompromised patient with this may be developing rejection. Consultation with [facility] pulmonology was initiated.  Dr. Karie Holloway of [facility] stated that this presentation is most consistent with a pneumonia rather than lung transplant rejection.  He recommended broad-spectrum antibiotics for treatment of immunocompromise pneumonia including Vanco and cefepime.  He also recommended blood and sputum cultures.  He felt the patient did not require transfer at this time based on his conversation and he felt comfortable with patient remaining in community care at this time.  He requested update if there is a change in patient's clinical status and otherwise if cleared from pneumonia he would be able to follow-up outpatient. He additionally recommended checking autoimmune levels intermittently throughout the hospitalization.  Disposition:   Based on the above findings, I  believe this patient is stable for admission.    Patient/family educated about specific findings on our evaluation and explained exact reasons for admission.  Patient/family educated about clinical situation and time was allowed to answer questions.   Admission team communicated with and agreed with need for admission. Patient admitted. Patient ready to move at this time.     Emergency Department Medication Summary:   Medications  0.9 %  sodium chloride infusion (0 mLs Intravenous Stopped 07/01/22 1640)  vancomycin (VANCOREADY) IVPB 1500 mg/300 mL (has no administration in time range)  ceFEPIme (MAXIPIME) 1 g in sodium chloride 0.9 % 100 mL IVPB (has no administration in time range)  albuterol (VENTOLIN HFA) 108 (90 Base) MCG/ACT inhaler 2 puff (2 puffs Inhalation Given 07/01/22 1336)  aerochamber plus with mask device 1 each (1 each Other Given 07/01/22 1336)  magnesium sulfate IVPB 2 g 50 mL (0 g Intravenous Stopped 07/01/22 1640)     CRITICAL CARE Performed by: Glyn Ade  ?  Total critical care time: 30 minutes  Critical care time was exclusive of separately billable procedures and treating other patients.  Critical care was necessary to treat or prevent imminent or life-threatening deterioration.  Critical care was time spent personally by me on the following activities: development of treatment plan with patient and/or surrogate as well as nursing, discussions with consultants, evaluation of patient's response to treatment, examination of patient, obtaining history from patient or surrogate, ordering and performing treatments and interventions, ordering and review of laboratory studies, ordering and review of radiographic studies, pulse oximetry and re-evaluation of patient's condition.  Extensive consultation with subspecialist providers.        Glyn Ade, MD 07/01/22 2316

## 2022-07-02 ENCOUNTER — Encounter (HOSPITAL_BASED_OUTPATIENT_CLINIC_OR_DEPARTMENT_OTHER): Payer: Self-pay | Admitting: Internal Medicine

## 2022-07-02 DIAGNOSIS — J189 Pneumonia, unspecified organism: Secondary | ICD-10-CM | POA: Diagnosis not present

## 2022-07-02 DIAGNOSIS — Z7960 Long term (current) use of unspecified immunomodulators and immunosuppressants: Secondary | ICD-10-CM | POA: Diagnosis not present

## 2022-07-02 DIAGNOSIS — I10 Essential (primary) hypertension: Secondary | ICD-10-CM

## 2022-07-02 DIAGNOSIS — Z7901 Long term (current) use of anticoagulants: Secondary | ICD-10-CM | POA: Diagnosis not present

## 2022-07-02 DIAGNOSIS — Z9049 Acquired absence of other specified parts of digestive tract: Secondary | ICD-10-CM | POA: Diagnosis not present

## 2022-07-02 DIAGNOSIS — Z7951 Long term (current) use of inhaled steroids: Secondary | ICD-10-CM | POA: Diagnosis not present

## 2022-07-02 DIAGNOSIS — I480 Paroxysmal atrial fibrillation: Secondary | ICD-10-CM | POA: Diagnosis present

## 2022-07-02 DIAGNOSIS — R053 Chronic cough: Secondary | ICD-10-CM | POA: Diagnosis present

## 2022-07-02 DIAGNOSIS — Z20822 Contact with and (suspected) exposure to covid-19: Secondary | ICD-10-CM | POA: Diagnosis present

## 2022-07-02 DIAGNOSIS — Z942 Lung transplant status: Secondary | ICD-10-CM | POA: Diagnosis not present

## 2022-07-02 DIAGNOSIS — N1832 Chronic kidney disease, stage 3b: Secondary | ICD-10-CM | POA: Diagnosis present

## 2022-07-02 DIAGNOSIS — D6851 Activated protein C resistance: Secondary | ICD-10-CM | POA: Diagnosis present

## 2022-07-02 DIAGNOSIS — R0602 Shortness of breath: Secondary | ICD-10-CM | POA: Diagnosis present

## 2022-07-02 DIAGNOSIS — I82501 Chronic embolism and thrombosis of unspecified deep veins of right lower extremity: Secondary | ICD-10-CM | POA: Diagnosis present

## 2022-07-02 DIAGNOSIS — Y830 Surgical operation with transplant of whole organ as the cause of abnormal reaction of the patient, or of later complication, without mention of misadventure at the time of the procedure: Secondary | ICD-10-CM | POA: Diagnosis present

## 2022-07-02 DIAGNOSIS — I129 Hypertensive chronic kidney disease with stage 1 through stage 4 chronic kidney disease, or unspecified chronic kidney disease: Secondary | ICD-10-CM | POA: Diagnosis present

## 2022-07-02 DIAGNOSIS — T86812 Lung transplant infection: Secondary | ICD-10-CM | POA: Diagnosis present

## 2022-07-02 DIAGNOSIS — E6609 Other obesity due to excess calories: Secondary | ICD-10-CM | POA: Diagnosis present

## 2022-07-02 DIAGNOSIS — Z8249 Family history of ischemic heart disease and other diseases of the circulatory system: Secondary | ICD-10-CM | POA: Diagnosis not present

## 2022-07-02 DIAGNOSIS — Z87891 Personal history of nicotine dependence: Secondary | ICD-10-CM | POA: Diagnosis not present

## 2022-07-02 DIAGNOSIS — Z6834 Body mass index (BMI) 34.0-34.9, adult: Secondary | ICD-10-CM | POA: Diagnosis not present

## 2022-07-02 DIAGNOSIS — Z79899 Other long term (current) drug therapy: Secondary | ICD-10-CM | POA: Diagnosis not present

## 2022-07-02 DIAGNOSIS — D84821 Immunodeficiency due to drugs: Secondary | ICD-10-CM | POA: Diagnosis present

## 2022-07-02 HISTORY — DX: Essential (primary) hypertension: I10

## 2022-07-02 LAB — BASIC METABOLIC PANEL
Anion gap: 11 (ref 5–15)
BUN: 30 mg/dL — ABNORMAL HIGH (ref 8–23)
CO2: 23 mmol/L (ref 22–32)
Calcium: 9.1 mg/dL (ref 8.9–10.3)
Chloride: 101 mmol/L (ref 98–111)
Creatinine, Ser: 1.8 mg/dL — ABNORMAL HIGH (ref 0.61–1.24)
GFR, Estimated: 41 mL/min — ABNORMAL LOW (ref >60)
Glucose, Bld: 109 mg/dL — ABNORMAL HIGH (ref 70–99)
Potassium: 3.9 mmol/L (ref 3.5–5.1)
Sodium: 135 mmol/L (ref 135–145)

## 2022-07-02 LAB — MRSA NEXT GEN BY PCR, NASAL: MRSA by PCR Next Gen: NOT DETECTED

## 2022-07-02 LAB — PROCALCITONIN: Procalcitonin: <0.1 ng/mL

## 2022-07-02 MED ORDER — ALBUTEROL SULFATE (2.5 MG/3ML) 0.083% IN NEBU: 2.5000 mg | INHALATION_SOLUTION | RESPIRATORY_TRACT | Status: DC | PRN

## 2022-07-02 MED ORDER — OXYCODONE HCL 5 MG PO TABS: 5.0000 mg | ORAL_TABLET | ORAL | Status: DC | PRN

## 2022-07-02 MED ORDER — DOCUSATE SODIUM 100 MG PO CAPS
100.0000 mg | ORAL_CAPSULE | Freq: Two times a day (BID) | ORAL | Status: DC
  Filled 2022-07-02: qty 1

## 2022-07-02 MED ORDER — BISACODYL 5 MG PO TBEC: 5.0000 mg | DELAYED_RELEASE_TABLET | Freq: Every day | ORAL | Status: DC | PRN

## 2022-07-02 MED ORDER — PANTOPRAZOLE SODIUM 40 MG PO TBEC
40.0000 mg | DELAYED_RELEASE_TABLET | Freq: Every day | ORAL | Status: DC
  Filled 2022-07-02: qty 1

## 2022-07-02 MED ORDER — MORPHINE SULFATE (PF) 2 MG/ML IV SOLN: 2.0000 mg | INTRAVENOUS | Status: DC | PRN

## 2022-07-02 MED ORDER — ACETAMINOPHEN 325 MG PO TABS: 650.0000 mg | ORAL_TABLET | Freq: Four times a day (QID) | ORAL | Status: DC | PRN

## 2022-07-02 MED ORDER — METOPROLOL TARTRATE 25 MG PO TABS
37.5000 mg | ORAL_TABLET | Freq: Two times a day (BID) | ORAL | Status: DC
  Filled 2022-07-02: qty 1

## 2022-07-02 MED ORDER — APIXABAN 5 MG PO TABS
5.0000 mg | ORAL_TABLET | Freq: Two times a day (BID) | ORAL | Status: DC
  Administered 2022-07-03: 5 mg via ORAL
  Filled 2022-07-02: qty 1

## 2022-07-02 MED ORDER — HYDRALAZINE HCL 20 MG/ML IJ SOLN: 5.0000 mg | INTRAMUSCULAR | Status: DC | PRN

## 2022-07-02 MED ORDER — TACROLIMUS 1 MG PO CAPS
3.0000 mg | ORAL_CAPSULE | Freq: Two times a day (BID) | ORAL | Status: DC
  Filled 2022-07-02 (×3): qty 3

## 2022-07-02 MED ORDER — ATOVAQUONE 750 MG/5ML PO SUSP
1500.0000 mg | Freq: Every day | ORAL | Status: DC
  Administered 2022-07-03: 1500 mg via ORAL
  Filled 2022-07-02: qty 10

## 2022-07-02 MED ORDER — POLYETHYLENE GLYCOL 3350 17 G PO PACK: 17.0000 g | PACK | Freq: Every day | ORAL | Status: DC | PRN

## 2022-07-02 MED ORDER — GUAIFENESIN ER 600 MG PO TB12: 600.0000 mg | ORAL_TABLET | Freq: Two times a day (BID) | ORAL | Status: DC | PRN

## 2022-07-02 MED ORDER — MYCOPHENOLATE MOFETIL 250 MG PO CAPS
1000.0000 mg | ORAL_CAPSULE | Freq: Two times a day (BID) | ORAL | Status: DC
  Filled 2022-07-02 (×3): qty 4

## 2022-07-02 MED ORDER — FAMOTIDINE 20 MG PO TABS
10.0000 mg | ORAL_TABLET | Freq: Every day | ORAL | Status: DC
  Filled 2022-07-02: qty 1

## 2022-07-02 MED ORDER — SODIUM CHLORIDE 0.9 % IV SOLN: INTRAVENOUS | Status: DC

## 2022-07-02 MED ORDER — ONDANSETRON HCL 4 MG PO TABS: 4.0000 mg | ORAL_TABLET | Freq: Four times a day (QID) | ORAL | Status: DC | PRN

## 2022-07-02 MED ORDER — ONDANSETRON HCL 4 MG/2ML IJ SOLN: 4.0000 mg | Freq: Four times a day (QID) | INTRAMUSCULAR | Status: DC | PRN

## 2022-07-02 MED ORDER — PREDNISOLONE 5 MG PO TABS
5.0000 mg | ORAL_TABLET | Freq: Every day | ORAL | Status: DC
  Filled 2022-07-02: qty 1

## 2022-07-02 MED ORDER — PREDNISONE 20 MG PO TABS: 20.0000 mg | ORAL_TABLET | Freq: Every day | ORAL | Status: DC

## 2022-07-02 MED ORDER — ACYCLOVIR 400 MG PO TABS
400.0000 mg | ORAL_TABLET | Freq: Two times a day (BID) | ORAL | Status: DC
  Administered 2022-07-03: 400 mg via ORAL
  Filled 2022-07-02 (×3): qty 1

## 2022-07-02 MED ORDER — ACETAMINOPHEN 650 MG RE SUPP: 650.0000 mg | Freq: Four times a day (QID) | RECTAL | Status: DC | PRN

## 2022-07-02 NOTE — TOC Initial Note (Addendum)
.Transition of Care [facility]) - Initial/Assessment Note    Patient Details  Name: Brent Holloway MRN: [id number] Date of Birth: April 21, 1956  Transition of Care [facility] CM/SW Contact:    Durenda Guthrie, RN Phone Number: 07/02/2022, 2:02 PM  Clinical Narrative:                 Transition of Care Screening Note:  Transition of Care Department [facility]) has reviewed patient and no TOC needs have been identified at this time. We will continue to monitor patient advancement through Interdisciplinary progressions. If new patient transition needs arise, please place a consult           Patient Goals and CMS Choice        Expected Discharge Plan and Services                                                Prior Living Arrangements/Services                       Activities of Daily Living Home Assistive Devices/Equipment: None ADL Screening (condition at time of admission) Patient's cognitive ability adequate to safely complete daily activities?: Yes Is the patient deaf or have difficulty hearing?: No Does the patient have difficulty seeing, even when wearing glasses/contacts?: No Does the patient have difficulty concentrating, remembering, or making decisions?: No Patient able to express need for assistance with ADLs?: Yes Does the patient have difficulty dressing or bathing?: No Independently performs ADLs?: Yes (appropriate for developmental age) Does the patient have difficulty walking or climbing stairs?: No Weakness of Legs: None Weakness of Arms/Hands: None  Permission Sought/Granted                  Emotional Assessment              Admission diagnosis:  Community acquired pneumonia [J18.9] CAP (community acquired pneumonia) [J18.9] Immunocompromised (HCC) [D84.9] Wheezing-associated respiratory infection (WARI) [J98.8] Pneumonia of left lung due to infectious organism, unspecified part of lung [J18.9] Patient Active Problem List    Diagnosis Date Noted   CAP (community acquired pneumonia) 07/02/2022   History of lung transplant (HCC) 07/02/2022   Essential hypertension 07/02/2022   History of exposure to asbestos 09/20/2020   Solitary pulmonary nodule 08/11/2019   OSA (obstructive sleep apnea) 07/12/2019   Chronic hypoxemic respiratory failure (HCC) 07/12/2019   IPF (idiopathic pulmonary fibrosis) (HCC) 07/12/2019   Class 1 obesity due to excess calories with body mass index (BMI) of 34.0 to 34.9 in adult 07/12/2019   Exertional dyspnea 04/15/2019   Snoring 04/15/2019   Interstitial lung disease (HCC) 07/23/2017   Leg DVT (deep venous thromboembolism), chronic, right (HCC) 02/02/2017   PCP:  Ileana Ladd, MD (Inactive) Pharmacy:   [id number] - [place], [place] - [address] [place] [address] Phone: [phone] Fax: [phone]  [facility] [place], [place] - [address] [place] [address] Phone: [phone] Fax: [phone]  [facility] - [place], [place] - [address] [place] [address] Phone: [phone] Fax: [phone]  [facility]) - [place], [place] - [address] [facility] [address] [place] [address] [place] [address] Phone: [phone] Fax: [phone]     Social  Determinants of Health (SDOH) Interventions    Readmission Risk Interventions     No data to display

## 2022-07-02 NOTE — ED Notes (Signed)
Patient resting comfortably at this time, no signs of distress, monitoring devices still in use.

## 2022-07-02 NOTE — Consult Note (Signed)
NAME:  Brent Holloway, MRN:  [id number], DOB:  Aug 30, 1955, LOS: 0 ADMISSION DATE:  07/01/2022, CONSULTATION DATE:  07/02/2022 REFERRING MD:  Dr. Ophelia Charter, [facility], CHIEF COMPLAINT:  Lung Transplant Medicines   History of Present Illness:  66 yo male with hx of ILD had bilateral lung transplant in March 2023 at [facility] was seen in ER on 12/11 with hx of A fib went into A fib on 12/11 and had cardioversion.  He was having cough for several days before this.  He continued to have cough after this and spoke with transplant team who advised him to come back to ER.  CXR showed new infiltrates.  He was started on antibiotics for pneumonia and reports clinical improvement.  PCCM consulted to manage his lung transplant medications.   He had worsening hypoxia in March 2023 and treated with steroids for organizing pneumonia.  He had Rt pleurodesis in April 2023.  He had MSSA in pleural effusion.  Pertinent  Medical History  DVT, Factor 5 Leiden, HTN, ILD  Interim History / Subjective:  He feels better since being started on ABx.  Cough improved and no longer wheezing.  Doesn't have tight/heavy feeling in his chest anymore either.  Objective   Blood pressure 121/81, pulse 89, temperature 98.4 F (36.9 C), temperature source Oral, resp. rate 20, height 5\' 11"  (1.803 m), weight 108.9 kg, SpO2 100 %.        Intake/Output Summary (Last 24 hours) at 07/02/2022 1442 Last data filed at 07/02/2022 07/04/2022 Gross per 24 hour  Intake 43.56 ml  Output 800 ml  Net -756.44 ml   Filed Weights   07/01/22 1234 07/02/22 1334  Weight: 111 kg 108.9 kg    Examination:  General - alert Eyes - pupils reactive ENT - no sinus tenderness, no stridor Cardiac - regular rate/rhythm, no murmur Chest - equal breath sounds b/l, no wheezing or rales Abdomen - soft, non tender, + bowel sounds Extremities - no cyanosis, clubbing, or edema Skin - no rashes Neuro - normal strength, moves extremities, follows commands Psych - normal  mood and behavior  Assessment & Plan:   Pneumonia in pt with hx of bilateral lung transplant on chronic immunosuppressive therapy. - day 2 of ABx - f/u 2 view CXR 12/14 - continue prograf, cellcept, prednisone - acyclovir and atovaquone for prophylaxis  Paroxysmal atrial fibrillation. - continue eliquis, lopressor  Updated his wife at bedside  Labs   CBC: Recent Labs  Lab 06/30/22 1229 07/01/22 1300 07/01/22 2115  WBC 11.7* 11.0* 9.8  NEUTROABS 10.1* 9.6*  --   HGB 13.3 13.0 11.7*  HCT 38.8* 36.6* 32.9*  MCV 87.6 87.6 86.6  PLT 208 195 178    Basic Metabolic Panel: Recent Labs  Lab 06/30/22 1229 07/01/22 1300 07/02/22 0642  NA 136 135 135  K 4.9 4.3 3.9  CL 101 102 101  CO2 25 20* 23  GLUCOSE 130* 125* 109*  BUN 38* 38* 30*  CREATININE 1.82* 1.85* 1.80*  CALCIUM 10.4* 9.4 9.1  MG 1.3* 1.4*  --    GFR: Estimated Creatinine Clearance: 50.6 mL/min (A) (by C-G formula based on SCr of 1.8 mg/dL (H)). Recent Labs  Lab 06/30/22 1229 07/01/22 1300 07/01/22 2115  WBC 11.7* 11.0* 9.8    Liver Function Tests: Recent Labs  Lab 07/01/22 1300  AST 13*  ALT 8  ALKPHOS 49  BILITOT 1.4*  PROT 6.4*  ALBUMIN 4.3   Recent Labs  Lab 07/01/22 1300  LIPASE 14  No results for input(s): "AMMONIA" in the last 168 hours.  ABG No results found for: "PHART", "PCO2ART", "PO2ART", "HCO3", "TCO2", "ACIDBASEDEF", "O2SAT"   Coagulation Profile: No results for input(s): "INR", "PROTIME" in the last 168 hours.  Cardiac Enzymes: No results for input(s): "CKTOTAL", "CKMB", "CKMBINDEX", "TROPONINI" in the last 168 hours.  HbA1C: No results found for: "HGBA1C"  CBG: No results for input(s): "GLUCAP" in the last 168 hours.  Review of Systems:   Reviewed and negative  Past Medical History:  He,  has a past medical history of DVT (deep venous thrombosis) ([place]), Essential hypertension (07/02/2022), Factor 5 Leiden mutation, heterozygous ([place]), and Interstitial lung  disease ([place]).   Surgical History:   Past Surgical History:  Procedure Laterality Date   APPENDECTOMY  1976   KNEE SURGERY Right    LUNG TRANSPLANT       Social History:   reports that he quit smoking about 5 years ago. His smoking use included cigars and cigarettes. He started smoking about 49 years ago. He has a 11.00 pack-year smoking history. He has never used smokeless tobacco. He reports that he does not currently use alcohol. He reports that he does not use drugs.   Family History:  His family history includes Congestive Heart Failure in his father; Dementia in his mother; Heart disease in his father.   Allergies No Known Allergies   Home Medications  Prior to Admission medications   Medication Sig Start Date End Date Taking? Authorizing Provider  acyclovir (ZOVIRAX) 400 MG tablet Take 400 mg by mouth 2 (two) times daily. 06/23/22  Yes [provider]  apixaban (ELIQUIS) 5 MG TABS tablet Take 5 mg by mouth 2 (two) times daily.   Yes [provider]  atovaquone (MEPRON) 750 MG/5ML suspension Take by mouth. 06/23/22  Yes [provider]  famotidine (PEPCID) 10 MG tablet Take 10 mg by mouth daily.   Yes [provider]  metoprolol tartrate (LOPRESSOR) 25 MG tablet Take 1.5 tablets by mouth every 12 (twelve) hours. 05/19/22  Yes [provider]  mycophenolate (CELLCEPT) 250 MG capsule Take 1,000 mg by mouth 2 (two) times daily.   Yes [provider]  omeprazole (PRILOSEC) 20 MG capsule TAKE 1 CAPSULE BY MOUTH  DAILY 02/06/21  Yes Margaretha Seeds, MD  prednisoLONE 5 MG TABS tablet Take 5 mg by mouth daily.   Yes [provider]  Specialty Vitamins Products (MG-PLUS PROTEIN PO) Take 3 tablets by mouth 2 (two) times daily.   Yes [provider]  tacrolimus (PROGRAF) 1 MG capsule Take 3 mg by mouth 2 (two) times daily.   Yes [provider]  VITAMIN D, CHOLECALCIFEROL, PO Take 1 tablet by mouth daily.    Yes [provider]     Signature:  Chesley Mires, MD [place] Pager - [phone] or [phone] 07/02/2022, 3:02 PM

## 2022-07-02 NOTE — H&P (Signed)
History and Physical    Patient: Brent Holloway [id number] DOB: 07-01-1956 DOA: 07/01/2022 DOS: the patient was seen and examined on 07/02/2022 PCP: None Patient coming from: Home - lives with wife; NOK: Wife, [phone]    Chief Complaint: SOB  HPI: Brent Holloway is a 66 y.o. 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   He reports that he had a terrible cough and SOB.  Symptoms started about a week ago, steadily worse.  He has not had issues since his lung transplant, has done well.  No fever.  No sick contacts.  Patient was initially seen at [facility] and then again on arrival at [facility].  His cough appears to be improving and he remains comfortable on RA.  His wife reports bronch on 10/25 (repeat in scheduled for 1/17) with presence of yeast (I do not see this on labs from that [place] on brief look); she reports that subsequent CT did not show evidence of infection so it was not treated.    ER Course:  [facility] to [facility] transfer, per Dr. Bridgett Larsson:  3 days of SOB, in afib yesterday. cardioverted in ER yesterday. persistent cough. left perihilar opacity new on CXR.  follows at [facility]. discussed with EDP that [facility]ology needs to agree to see this patient on a daily basis and actively manage his transplant meds/issues. Admission deferred until [facility] pulmonology agrees to see patient in consult and actively manage patient.   **update. EDP has discussed case with [facility]([place], [place]). they have agreed to consult and see the patient on a daily basis.     Review of Systems: As mentioned in the history of present illness. All other systems reviewed and are negative. Past Medical History:  Diagnosis Date   DVT (deep venous thrombosis) ([place])    Essential hypertension 07/02/2022   Factor 5 Leiden mutation, heterozygous ([place])    Interstitial lung disease ([place])    s/p lung transplant    Past Surgical History:  Procedure Laterality Date   APPENDECTOMY  1976   KNEE SURGERY Right    LUNG TRANSPLANT     Social History:  reports that he quit smoking about 5 years ago. His smoking use included cigars and cigarettes. He started smoking about 49 years ago. He has a 11.00 pack-year smoking history. He has never used smokeless tobacco. He reports that he does not currently use alcohol. He reports that he does not use drugs.  No Known Allergies  Family History  Problem Relation Age of Onset   Heart disease Father    Congestive Heart Failure Father    Dementia Mother     Prior to Admission medications   Medication Sig Start Date End Date Taking? Authorizing Provider  apixaban (ELIQUIS) 5 MG TABS tablet Take 5 mg by mouth 2 (two) times daily.    [provider]  Adair Patter [phone] MCG/ACT AEPB USE 1 INHALATION BY MOUTH  DAILY 08/09/21   Margaretha Seeds, MD  Multiple Vitamins-Minerals (MULTIVITAMIN WITH MINERALS) tablet Take 1 tablet by mouth daily.    [provider]  Nintedanib (OFEV) 150 MG CAPS TAKE 1 CAPSULE BY MOUTH  TWICE A DAY 12 HOURS APART  WITH FOOD 11/23/20   Margaretha Seeds, MD  Omega-3 Fatty Acids (FISH OIL) 1000 MG CAPS Take 1 capsule by mouth daily.    [provider]  omeprazole (PRILOSEC) 20 MG capsule TAKE 1 CAPSULE  BY MOUTH  DAILY 02/06/21   Margaretha Seeds, MD  XARELTO 20 MG TABS tablet Take 20 mg by mouth daily. 01/19/17   [provider]    Physical Exam: Vitals:   07/02/22 0400 07/02/22 0500 07/02/22 0615 07/02/22 0700  BP: 136/81 (!) 143/109 (!) 159/79   Pulse: 85 83 86   Resp: 17 18 18    Temp:    98.3 F (36.8 C)  TempSrc:    Oral  SpO2: 94% 94% 92%   Weight:      Height:       Exam initially performed in person at [facility] and then again upon arrival at [facility]:  General:  Appears calm and comfortable and is in NAD, on RA, harsh cough Eyes:  PERRL, EOMI, normal lids, iris ENT:  grossly normal hearing, lips &  tongue, mmm; suboptimal dentition Neck:  no LAD, masses or thyromegaly Cardiovascular:  RRR, no m/r/g. No LE edema.  Respiratory:   Coarse breath sounds mostly of the LLL but also audible anteriorly.  Normal respiratory effort on RA. Abdomen:  soft, NT, ND Back:   normal alignment, no CVAT Skin:  no rash or induration seen on limited exam Musculoskeletal:  grossly normal tone BUE/BLE, good ROM, no bony abnormality Psychiatric:  blunted mood and affect, speech fluent and appropriate, AOx3 Neurologic:  CN 2-12 grossly intact, moves all extremities in coordinated fashion   Radiological Exams on Admission: Independently reviewed - see discussion in A/P where applicable  DG Chest Port 1 View  Result Date: 07/01/2022 CLINICAL DATA:  Shortness of breath and cough. Cardioversion yesterday for atrial fibrillation. EXAM: PORTABLE CHEST 1 VIEW COMPARISON:  Multiple exams, including 06/30/2022 12:38 p.m. FINDINGS: Again noted are numerous clips along the central mediastinum and findings characteristic for prior lung transplant. Right pleural thickening and blunting the right costophrenic angle, cannot exclude right pleural effusion. There is some progressive opacity at the right lung base probably from atelectasis. Mild interstitial accentuation and indistinctness of the pulmonary vasculature, mild edema is not excluded. Compared to the exam from 1 day ago, there is some faint left perihilar opacity including a 1.4 by 2.2 cm nodular opacity which, being new, is most likely due to edema or infection Indistinctness along the left heart border may reflect mild lingular atelectasis. Suspected enlargement of the cardiopericardial silhouette. IMPRESSION: 1. New 1.4 x 2.2 cm nodular opacity in the left perihilar region, most likely due to edema or infection. 2. Progressive opacity at the right lung base, probably from atelectasis. 3. Suspected enlargement of the cardiopericardial silhouette with mild interstitial  accentuation and indistinctness of the pulmonary vasculature, favoring interstitial edema. 4. Right pleural thickening and blunting of the right costophrenic angle, probable small right pleural effusion. 5. Postoperative findings characteristic for prior lung transplant. Electronically Signed   By: Van Clines M.D.   On: 07/01/2022 13:12   DG Chest Port 1 View  Result Date: 06/30/2022 CLINICAL DATA:  Atrial fibrillation. Fatigue with shortness of breath for 1 week. Previous bilateral lung transplant 9 months ago. EXAM: PORTABLE CHEST 1 VIEW COMPARISON:  Chest radiographs 01/13/2019.  CT 01/09/2019. FINDINGS: 1238 hours. Interval postsurgical changes consistent with bilateral lung transplant. The heart size and mediastinal contours are stable. There are low lung volumes with probable mild atelectasis at both lung bases. There are probable postsurgical changes in the left lung base. No edema, confluent airspace opacity, pleural effusion or pneumothorax. No acute osseous findings are evident. Telemetry leads overlie the chest. IMPRESSION: Interval postsurgical  changes consistent with bilateral lung transplant. Probable mild bibasilar atelectasis. No other acute cardiopulmonary process. Electronically Signed   By: Carey Bullocks M.D.   On: 06/30/2022 12:53    EKG: Independently reviewed.  NSR with rate 76; no evidence of acute ischemia   Labs on Admission: I have personally reviewed the available labs and imaging studies at the time of the admission.  Pertinent labs:    Glucose 109 BUN 30/Creatinine 1.80/GFR 41, stable since 12/11 BNP 508.3 HS troponin 20 WBC 11.7 Hgb 11.7 COVID/flu/RSV negative Blood cultures pending RVP negative   Assessment and Plan: Active Problems:   Leg DVT (deep venous thromboembolism), chronic, right (HCC)   Class 1 obesity due to excess calories with body mass index (BMI) of 34.0 to 34.9 in adult   CAP (community acquired pneumonia)   History of lung  transplant (HCC)   Essential hypertension    CAP in immunocompromised patient -Patient presenting with cough,SOB, mildly decreased oxygen saturation (92%), and infiltrate in left perihilar region on chest x-ray -This appears to be most likely community-acquired pneumonia but patient is immunocompromised which complicates his presentation.  -Influenza negative. -COVID-19 negative. -Blood cultures are pending -Respiratory virus panel negative -Will order lower respiratory tract procalcitonin level.   >0.5 indicates infection and >>0.5 indicates more serious disease.  As the procalcitonin level normalizes, it will be reasonable to consider de-escalation of antibiotic coverage.  The sensitivity of procalcitonin is variable and should not be used alone to guide treatment. -CURB-65 score is 3, meaning that the patient has a 14% risk of death. -Pneumonia Severity Index (PSI) is Class 4, 9% mortality. -Will continue Zosyn at this time given his immunosuppression; will hold further Vanc doses given negative MRSA PCR -Additional complicating factors include:  immunocompromise. -NS @ 75cc/hr -Fever control -Repeat CBC in am -Will add albuterol PRN -Will add Mucinex for cough -Pulmonology will consult   H/o lung transplant -Continue immunosuppressant medications - atovaquone, tacrolimus, Cellcept, prednisolone, acyclovir  H/o DVT, Factor 5 leiden -Continue Eliquis  HTN -Continue metoprolol  Obesity -Body mass index is 34.13 kg/m..  -Weight loss should be encouraged -Outpatient PCP/bariatric medicine f/u encouraged     Advance Care Planning:   Code Status: Full Code - Code status was discussed with the patient at the time of admission.  The patient would want to receive full resuscitative measures at this time.   Consults: Pulmonology; RT; PT/OT  DVT Prophylaxis: Eliquis  Family Communication: None present at [facility]; his wife was present at the time of evaluation at  [facility]  Severity of Illness: The appropriate patient status for this patient is INPATIENT. Inpatient status is judged to be reasonable and necessary in order to provide the required intensity of service to ensure the patient's safety. The patient's presenting symptoms, physical exam findings, and initial radiographic and laboratory data in the context of their chronic comorbidities is felt to place them at high risk for further clinical deterioration. Furthermore, it is not anticipated that the patient will be medically stable for discharge from the hospital within 2 midnights of admission.   * I certify that at the point of admission it is my clinical judgment that the patient will require inpatient hospital care spanning beyond 2 midnights from the point of admission due to high intensity of service, high risk for further deterioration and high frequency of surveillance required.*  Author: Jonah Blue, MD 07/02/2022 9:50 AM  For on call review [email or web address].

## 2022-07-02 NOTE — Evaluation (Signed)
Occupational Therapy Evaluation Patient Details Name: Brent Holloway MRN: [id number] DOB: 1955/09/02 Today's Date: 07/02/2022   History of Present Illness Brent Holloway is a 66 y.o. male presenting with SOB and cough. Pt with medical history significant of ILD s/p lung transplant (09/21/21) and DVT with factor 5 Leiden mutation   Clinical Impression   Brent Holloway was evaluated s/p the above admission list, he is indep at baseline and lives with his wife who can assist as needed at d/c. Upon evaluation pt reported that he feels much better, and back to baseline. He is indep with mobility and ADLs. Encouraged frequent mobility while in acute care and indep self care. He verbalized understandable and is agreeable. OT will sign off, recommend d/c with assist from family as needed.      Recommendations for follow up therapy are one component of a multi-disciplinary discharge planning process, led by the attending physician.  Recommendations may be updated based on patient status, additional functional criteria and insurance authorization.   Follow Up Recommendations  No OT follow up     Assistance Recommended at Discharge None  Patient can return home with the following Assist for transportation    Functional Status Assessment  Patient has had a recent decline in their functional status and demonstrates the ability to make significant improvements in function in a reasonable and predictable amount of time.  Equipment Recommendations  None recommended by OT       Precautions / Restrictions Precautions Precautions: Fall (low) Restrictions Weight Bearing Restrictions: No      Mobility Bed Mobility Overal bed mobility: Independent                  Transfers Overall transfer level: Independent          Balance Overall balance assessment: Independent             ADL either performed or assessed with clinical judgement   ADL Overall ADL's : Independent                  Vision Baseline Vision/History: 0 No visual deficits Vision Assessment?: No apparent visual deficits     Perception Perception Perception Tested?: No   Praxis Praxis Praxis tested?: Within functional limits    Pertinent Vitals/Pain Pain Assessment Pain Assessment: No/denies pain        Extremity/Trunk Assessment Upper Extremity Assessment Upper Extremity Assessment: Overall WFL for tasks assessed   Lower Extremity Assessment Lower Extremity Assessment: Overall WFL for tasks assessed   Cervical / Trunk Assessment Cervical / Trunk Assessment: Normal   Communication Communication Communication: No difficulties   Cognition Arousal/Alertness: Awake/alert Behavior During Therapy: WFL for tasks assessed/performed Overall Cognitive Status: Within Functional Limits for tasks assessed           General Comments  VSS on RA, pt reports he is feeling much better. WIfe present.     Home Living Family/patient expects to be discharged to:: Private residence Living Arrangements: Spouse/significant other Available Help at Discharge: Family;Available 24 hours/day Type of Home: House Home Access: Stairs to enter [employer] of Steps: 4-6 Entrance Stairs-Rails: Right;Left Home Layout: Two level;Able to live on main level with bedroom/bathroom Alternate Level Stairs-Number of Steps: flight   Bathroom Shower/Tub: Occupational psychologist: Standard     Home Equipment: Conservation officer, nature (2 wheels);Cane - single point;BSC/3in1   Additional Comments: DME from lung transplnat, did not use      Prior Functioning/Environment Prior Level of Function : Independent/Modified Independent;Driving  Mobility Comments: no AD ADLs Comments: indep        OT Problem List: Decreased activity tolerance         OT Goals(Current goals can be found in the care plan section) Acute Rehab OT Goals Patient Stated Goal: home OT Goal Formulation: With  patient Time For Goal Achievement: 07/02/22 Potential to Achieve Goals: Good   AM-PAC OT "6 Clicks" Daily Activity     Outcome Measure Help from another person eating meals?: None Help from another person taking care of personal grooming?: None Help from another person toileting, which includes using toliet, bedpan, or urinal?: None Help from another person bathing (including washing, rinsing, drying)?: None Help from another person to put on and taking off regular upper body clothing?: None Help from another person to put on and taking off regular lower body clothing?: None 6 Click Score: 24   End of Session Nurse Communication: Mobility status  Activity Tolerance: Patient tolerated treatment well Patient left: in bed;with call bell/phone within reach;with family/visitor present  OT Visit Diagnosis: Muscle weakness (generalized) (M62.81)                Time: 1440-1456 OT Time Calculation (min): 16 min Charges:  OT General Charges $OT Visit: 1 Visit OT Evaluation $OT Eval Low Complexity: 1 Low   Aimee Timmons D Causey 07/02/2022, 3:12 PM

## 2022-07-02 NOTE — ED Notes (Signed)
Handoff report given to [facility] 

## 2022-07-02 NOTE — ED Notes (Signed)
Called [facility] and spoke to Myrick Pecos; informed that the patient was ready for transport

## 2022-07-02 NOTE — ED Notes (Signed)
Pt's home meds at bedside in weekly organizer; pt will take meds as prescribed

## 2022-07-03 ENCOUNTER — Other Ambulatory Visit (HOSPITAL_COMMUNITY): Payer: Self-pay

## 2022-07-03 ENCOUNTER — Inpatient Hospital Stay (HOSPITAL_COMMUNITY): Payer: Medicare Other

## 2022-07-03 DIAGNOSIS — J189 Pneumonia, unspecified organism: Secondary | ICD-10-CM | POA: Diagnosis not present

## 2022-07-03 LAB — HIV ANTIBODY (ROUTINE TESTING W REFLEX): HIV Screen 4th Generation wRfx: NONREACTIVE

## 2022-07-03 LAB — BASIC METABOLIC PANEL
Anion gap: 11 (ref 5–15)
BUN: 28 mg/dL — ABNORMAL HIGH (ref 8–23)
CO2: 20 mmol/L — ABNORMAL LOW (ref 22–32)
Calcium: 8.8 mg/dL — ABNORMAL LOW (ref 8.9–10.3)
Chloride: 108 mmol/L (ref 98–111)
Creatinine, Ser: 1.79 mg/dL — ABNORMAL HIGH (ref 0.61–1.24)
GFR, Estimated: 41 mL/min — ABNORMAL LOW (ref >60)
Glucose, Bld: 109 mg/dL — ABNORMAL HIGH (ref 70–99)
Potassium: 3.9 mmol/L (ref 3.5–5.1)
Sodium: 139 mmol/L (ref 135–145)

## 2022-07-03 LAB — CBC
HCT: 34.8 % — ABNORMAL LOW (ref 39.0–52.0)
Hemoglobin: 12 g/dL — ABNORMAL LOW (ref 13.0–17.0)
MCH: 30.5 pg (ref 26.0–34.0)
MCHC: 34.5 g/dL (ref 30.0–36.0)
MCV: 88.5 fL (ref 80.0–100.0)
Platelets: 169 10*3/uL (ref 150–400)
RBC: 3.93 MIL/uL — ABNORMAL LOW (ref 4.22–5.81)
RDW: 14.4 % (ref 11.5–15.5)
WBC: 8 10*3/uL (ref 4.0–10.5)
nRBC: 0 % (ref 0.0–0.2)

## 2022-07-03 LAB — MAGNESIUM: Magnesium: 1.6 mg/dL — ABNORMAL LOW (ref 1.7–2.4)

## 2022-07-03 MED ORDER — MAGNESIUM OXIDE -MG SUPPLEMENT 400 (240 MG) MG PO TABS
400.0000 mg | ORAL_TABLET | Freq: Two times a day (BID) | ORAL | Status: DC
  Filled 2022-07-03: qty 1

## 2022-07-03 MED ORDER — AMOXICILLIN-POT CLAVULANATE 875-125 MG PO TABS
1.0000 | ORAL_TABLET | Freq: Two times a day (BID) | ORAL | 0 refills | Status: AC
  Filled 2022-07-03: qty 10, 5d supply, fill #0

## 2022-07-03 MED ORDER — MAGNESIUM OXIDE 400 MG PO TABS
400.0000 mg | ORAL_TABLET | Freq: Two times a day (BID) | ORAL | 0 refills | Status: AC
  Filled 2022-07-03: qty 10, 5d supply, fill #0

## 2022-07-03 MED ORDER — AMOXICILLIN-POT CLAVULANATE 875-125 MG PO TABS: 1.0000 | ORAL_TABLET | Freq: Two times a day (BID) | ORAL | 0 refills | Status: DC

## 2022-07-03 MED ORDER — GUAIFENESIN ER 600 MG PO TB12: 600.0000 mg | ORAL_TABLET | Freq: Two times a day (BID) | ORAL | 0 refills | Status: DC | PRN

## 2022-07-03 MED ORDER — OYSTER SHELL CALCIUM/D3 500-5 MG-MCG PO TABS: 1.0000 | ORAL_TABLET | Freq: Two times a day (BID) | ORAL | Status: DC

## 2022-07-03 MED ORDER — GUAIFENESIN ER 600 MG PO TB12
600.0000 mg | ORAL_TABLET | Freq: Two times a day (BID) | ORAL | 0 refills | Status: AC | PRN
  Filled 2022-07-03: qty 30, 15d supply, fill #0

## 2022-07-03 MED ORDER — MAGNESIUM OXIDE -MG SUPPLEMENT 400 (240 MG) MG PO TABS: 400.0000 mg | ORAL_TABLET | Freq: Two times a day (BID) | ORAL | 0 refills | Status: DC

## 2022-07-03 MED ORDER — AMOXICILLIN-POT CLAVULANATE 875-125 MG PO TABS
1.0000 | ORAL_TABLET | Freq: Two times a day (BID) | ORAL | Status: DC
  Administered 2022-07-03: 1 via ORAL
  Filled 2022-07-03: qty 1

## 2022-07-03 NOTE — Progress Notes (Signed)
2234: This RN went to medicate patient. Patient had pill dividers on bedside table. Patient states he was told he could take his own medication. Pharmacy and Crosley MD notified. Patient educated on hospital policy. Patient is agreeable to have his meds taken to pharmacy. Pharmacy told this RN that a pharmacist would come up to the floor in the morning due to lack of staff to sort medications that It could not be done overnight. Crosley MD aware of situation. Barbaraann Rondo RN notified. Patient is concerned about giving up his medications due to his lung transplant.

## 2022-07-03 NOTE — Plan of Care (Signed)

## 2022-07-03 NOTE — Evaluation (Signed)
Physical Therapy Evaluation/ Discharge Patient Details Name: Freeland Leclair MRN: [id number] DOB: Oct 14, 1955 Today's Date: 07/03/2022  History of Present Illness  66 y.o. male admitted 12/12 with SOB and AFib requiring cardioversion in ED. PMHx:  ILD s/p lung transplant (09/21/21) and DVT with factor 5 Leiden mutation  Clinical Impression  Pt very pleasant and reports no SOB or pain. Pt with long hall ambulation without physical assist with noted desaturation to 89% on RA. Pt with cues and education for energy conservation and standing rest breaks pt able to recover in <49min to 94% with occurrence x 2 during long hall ambulation. Pt with good strength and function and reports understanding of need to monitor desaturation and adjust activity accordingly, RN also made aware. No further acute therapy needs with pt aware and agreeable, will sign off.        Recommendations for follow up therapy are one component of a multi-disciplinary discharge planning process, led by the attending physician.  Recommendations may be updated based on patient status, additional functional criteria and insurance authorization.  Follow Up Recommendations No PT follow up      Assistance Recommended at Discharge    Patient can return home with the following       Equipment Recommendations None recommended by PT  Recommendations for Other Services       Functional Status Assessment Patient has not had a recent decline in their functional status     Precautions / Restrictions Precautions Precautions: Other (comment) Precaution Comments: watch sats      Mobility  Bed Mobility Overal bed mobility: Independent                  Transfers Overall transfer level: Independent                      Ambulation/Gait Ambulation/Gait assistance: Independent Gait Distance (Feet): 550 Feet Assistive device: None Gait Pattern/deviations: WFL(Within Functional Limits)   Gait velocity interpretation:  >4.37 ft/sec, indicative of normal walking speed   General Gait Details: pt with desaturation during gait to 89% on RA with standing rest to recover and education for energy conservation as well as monitoring with pulse ox at home  Stairs Stairs: Yes Stairs assistance: Modified independent (Device/Increase time) Stair Management: Alternating pattern, Forwards Number of Stairs: 11    Wheelchair Mobility    Modified Rankin (Stroke Patients Only)       Balance Overall balance assessment: Independent                                           Pertinent Vitals/Pain Pain Assessment Pain Assessment: No/denies pain    Home Living Family/patient expects to be discharged to:: Private residence Living Arrangements: Spouse/significant other Available Help at Discharge: Family;Available 24 hours/day Type of Home: House Home Access: Stairs to enter Entrance Stairs-Rails: Psychiatric nurse of Steps: 4   Home Layout: Two level;Able to live on main level with bedroom/bathroom Home Equipment: Rolling Walker (2 wheels);Cane - single point;BSC/3in1      Prior Function Prior Level of Function : Independent/Modified Independent;Driving                     Hand Dominance        Extremity/Trunk Assessment   Upper Extremity Assessment Upper Extremity Assessment: Overall WFL for tasks assessed    Lower Extremity Assessment Lower  Extremity Assessment: Overall WFL for tasks assessed    Cervical / Trunk Assessment Cervical / Trunk Assessment: Normal  Communication   Communication: No difficulties  Cognition Arousal/Alertness: Awake/alert Behavior During Therapy: WFL for tasks assessed/performed Overall Cognitive Status: Within Functional Limits for tasks assessed                                          General Comments      Exercises     Assessment/Plan    PT Assessment Patient does not need any further PT  services  PT Problem List         PT Treatment Interventions      PT Goals (Current goals can be found in the Care Plan section)  Acute Rehab PT Goals PT Goal Formulation: All assessment and education complete, DC therapy    Frequency       Co-evaluation               AM-PAC PT "6 Clicks" Mobility  Outcome Measure Help needed turning from your back to your side while in a flat bed without using bedrails?: None Help needed moving from lying on your back to sitting on the side of a flat bed without using bedrails?: None Help needed moving to and from a bed to a chair (including a wheelchair)?: None Help needed standing up from a chair using your arms (e.g., wheelchair or bedside chair)?: None Help needed to walk in hospital room?: None Help needed climbing 3-5 steps with a railing? : None 6 Click Score: 24    End of Session   Activity Tolerance: Patient tolerated treatment well Patient left: in bed;with call bell/phone within reach;with nursing/sitter in room Nurse Communication: Mobility status PT Visit Diagnosis: Other abnormalities of gait and mobility (R26.89)    Time: 4888-9169 PT Time Calculation (min) (ACUTE ONLY): 16 min   Charges:   PT Evaluation $PT Eval Low Complexity: 1 Low          tympanic membrane   Kherington Meraz B Champayne Kocian 07/03/2022, 10:22 AM

## 2022-07-03 NOTE — Progress Notes (Signed)
[facility] PROGRESS NOTE   Brent Holloway GNO:[id number] DOB: September 21, 1955 DOA: 07/01/2022  PCP: Ileana Ladd, MD (Inactive)  Brief History/Interval Summary: 66 y.o. 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   Patient found to have pneumonia.  Started on antibiotics.  Pulmonology was consulted.    Consultants: Pulmonology  Procedures: None    Subjective/Interval History: Patient mentions that he continues to have a cough.  Denies any shortness of breath or chest pain.  No nausea or vomiting.    Assessment/Plan:  Community-acquired pneumonia in immunocompromised patient in the setting of her history of lung transplant Pulmonology is following. COVID-19 test was negative.  Influenza PCR was negative.  Respiratory viral panel unremarkable. Patient currently on Zosyn.  Respiratory status is stable. Follow-up on cultures.  Procalcitonin less than 0.1. Chest x-ray does raise concern for pulm edema though examination does not suggest this.  Saturating normal on room air.  Echocardiogram from March of this year done at [facility] showed normal systolic function.    History of lung transplant Continue with immunosuppressants.  Pulmonology is following. Patient noted to be on acyclovir, atovaquone, mycophenolate, tacrolimus, prednisone.  History of DVT/factor V Ley[place] Continue Eliquis.  Essential hypertension Continue metoprolol  Chronic kidney disease stage IIIb/hypomagnesemia Renal function close to baseline. Magnesium.  Obesity  Estimated body mass index is 33.47 kg/m as calculated from the following:   Height as of this encounter: 5\' 11"  (1.803 m).   Weight as of this encounter: 108.9 kg.   DVT Prophylaxis: On Eliquis Code Status: Full code Family Communication: Discussed with the patient Disposition Plan: Hopefully return home when improved  Status is: Inpatient Remains  inpatient appropriate because: Pneumonia in immunocompromised status      Medications: Scheduled:  acyclovir  400 mg Oral BID   apixaban  5 mg Oral BID   atovaquone  1,500 mg Oral Q breakfast   docusate sodium  100 mg Oral BID   famotidine  10 mg Oral Daily   metoprolol tartrate  37.5 mg Oral Q12H   mycophenolate  1,000 mg Oral BID   pantoprazole  40 mg Oral Daily   prednisoLONE  5 mg Oral Daily   tacrolimus  3 mg Oral BID   Continuous:  sodium chloride Stopped (07/01/22 1639)   sodium chloride Stopped (07/02/22 1142)   piperacillin-tazobactam (ZOSYN)  IV 3.375 g (07/03/22 0007)   VQX:IHWTUU chloride, acetaminophen **OR** acetaminophen, albuterol, bisacodyl, guaiFENesin, hydrALAZINE, morphine injection, ondansetron **OR** ondansetron (ZOFRAN) IV, oxyCODONE, polyethylene glycol  Antibiotics: Anti-infectives (From admission, onward)    Start     Dose/Rate Route Frequency Ordered Stop   07/03/22 0800  atovaquone (MEPRON) 750 MG/5ML suspension 1,500 mg        1,500 mg Oral Daily with breakfast 07/02/22 0943     07/02/22 2200  acyclovir (ZOVIRAX) tablet 400 mg        400 mg Oral 2 times daily 07/02/22 1345     07/02/22 2000  vancomycin (VANCOCIN) IVPB 1000 mg/200 mL premix  Status:  Discontinued        1,000 mg 200 mL/hr over 60 Minutes Intravenous Every 24 hours 07/01/22 2044 07/02/22 0943   07/02/22 0100  piperacillin-tazobactam (ZOSYN) IVPB 3.375 g        3.375 g 12.5 mL/hr over 240 Minutes Intravenous Every 8 hours 07/01/22 2117     07/01/22 1945  vancomycin (VANCOCIN) IVPB 1000 mg/200 mL premix  1,000 mg 200 mL/hr over 60 Minutes Intravenous Every hour 07/01/22 1937 07/01/22 2251   07/01/22 1845  piperacillin-tazobactam (ZOSYN) IVPB 3.375 g        3.375 g 100 mL/hr over 30 Minutes Intravenous  Once 07/01/22 1843 07/01/22 1953   07/01/22 1830  vancomycin (VANCOREADY) IVPB 1500 mg/300 mL  Status:  Discontinued        1,500 mg 150 mL/hr over 120 Minutes Intravenous   Once 07/01/22 1822 07/01/22 1936   07/01/22 1830  ceFEPIme (MAXIPIME) 1 g in sodium chloride 0.9 % 100 mL IVPB  Status:  Discontinued        1 g 200 mL/hr over 30 Minutes Intravenous  Once 07/01/22 1822 07/01/22 1843       Objective:  Vital Signs  Vitals:   07/02/22 1553 07/02/22 1929 07/03/22 0410 07/03/22 0833  BP: 129/83 (!) 142/72 (!) 138/97 (!) 151/93  Pulse: 79 88 85 81  Resp: 18   17  Temp: 98.3 F (36.8 C) 98.1 F (36.7 C) 97.9 F (36.6 C) 97.8 F (36.6 C)  TempSrc: Oral Oral Oral   SpO2: 95% 94% 96% 99%  Weight:      Height:        Intake/Output Summary (Last 24 hours) at 07/03/2022 0933 Last data filed at 07/03/2022 0100 Gross per 24 hour  Intake 110.24 ml  Output --  Net 110.24 ml   Filed Weights   07/01/22 1234 07/02/22 1334  Weight: 111 kg 108.9 kg    General appearance: Awake alert.  In no distress Resp: Clear to auscultation bilaterally.  Normal effort Cardio: S1-S2 is normal regular.  No S3-S4.  No rubs murmurs or bruit GI: Abdomen is soft.  Nontender nondistended.  Bowel sounds are present normal.  No masses organomegaly Extremities: No edema.  Full range of motion of lower extremities. Neurologic: Alert and oriented x3.  No focal neurological deficits.    Lab Results:  Data Reviewed: I have personally reviewed following labs and reports of the imaging studies  CBC: Recent Labs  Lab 06/30/22 1229 07/01/22 1300 07/01/22 2115 07/03/22 0259  WBC 11.7* 11.0* 9.8 8.0  NEUTROABS 10.1* 9.6*  --   --   HGB 13.3 13.0 11.7* 12.0*  HCT 38.8* 36.6* 32.9* 34.8*  MCV 87.6 87.6 86.6 88.5  PLT 208 195 178 [id number]    Basic Metabolic Panel: Recent Labs  Lab 06/30/22 1229 07/01/22 1300 07/02/22 0642 07/03/22 0259  NA 136 135 135 139  K 4.9 4.3 3.9 3.9  CL 101 102 101 108  CO2 25 20* 23 20*  GLUCOSE 130* 125* 109* 109*  BUN 38* 38* 30* 28*  CREATININE 1.82* 1.85* 1.80* 1.79*  CALCIUM 10.4* 9.4 9.1 8.8*  MG 1.3* 1.4*  --  1.6*     GFR: Estimated Creatinine Clearance: 50.9 mL/min (A) (by C-G formula based on SCr of 1.79 mg/dL (H)).  Liver Function Tests: Recent Labs  Lab 07/01/22 1300  AST 13*  ALT 8  ALKPHOS 49  BILITOT 1.4*  PROT 6.4*  ALBUMIN 4.3    Recent Labs  Lab 07/01/22 1300  LIPASE 14     Recent Results (from the past 240 hour(s))  Resp panel by RT-PCR (RSV, Flu A&B, Covid) Anterior Nasal Swab     Status: None   Collection Time: 07/01/22 12:57 PM   Specimen: Anterior Nasal Swab  Result Value Ref Range Status   SARS Coronavirus 2 by RT PCR NEGATIVE NEGATIVE Final    Comment: (  NOTE) SARS-CoV-2 target nucleic acids are NOT DETECTED.  The SARS-CoV-2 RNA is generally detectable in upper respiratory specimens during the acute phase of infection. The lowest concentration of SARS-CoV-2 viral copies this assay can detect is 138 copies/mL. A negative result does not preclude SARS-Cov-2 infection and should not be used as the sole basis for treatment or other patient management decisions. A negative result may occur with  improper specimen collection/handling, submission of specimen other than nasopharyngeal swab, presence of viral mutation(s) within the areas targeted by this assay, and inadequate number of viral copies(<138 copies/mL). A negative result must be combined with clinical observations, patient history, and epidemiological information. The expected result is Negative.  Fact Sheet for Patients:  EntrepreneurPulse.com.au  Fact Sheet for Healthcare Providers:  IncredibleEmployment.be  This test is no t yet approved or cleared by the [place] FDA and  has been authorized for detection and/or diagnosis of SARS-CoV-2 by FDA under an Emergency Use Authorization (EUA). This EUA will remain  in effect (meaning this test can be used) for the duration of the COVID-19 declaration under Section 564(b)(1) of the Act, 21 U.S.C.section  360bbb-3(b)(1), unless the authorization is terminated  or revoked sooner.       Influenza A by PCR NEGATIVE NEGATIVE Final   Influenza B by PCR NEGATIVE NEGATIVE Final    Comment: (NOTE) The Xpert Xpress SARS-CoV-2/FLU/RSV plus assay is intended as an aid in the diagnosis of influenza from Nasopharyngeal swab specimens and should not be used as a sole basis for treatment. Nasal washings and aspirates are unacceptable for Xpert Xpress SARS-CoV-2/FLU/RSV testing.  Fact Sheet for Patients: EntrepreneurPulse.com.au  Fact Sheet for Healthcare Providers: IncredibleEmployment.be  This test is not yet approved or cleared by the [place] FDA and has been authorized for detection and/or diagnosis of SARS-CoV-2 by FDA under an Emergency Use Authorization (EUA). This EUA will remain in effect (meaning this test can be used) for the duration of the COVID-19 declaration under Section 564(b)(1) of the Act, 21 U.S.C. section 360bbb-3(b)(1), unless the authorization is terminated or revoked.     Resp Syncytial Virus by PCR NEGATIVE NEGATIVE Final    Comment: (NOTE) Fact Sheet for Patients: EntrepreneurPulse.com.au  Fact Sheet for Healthcare Providers: IncredibleEmployment.be  This test is not yet approved or cleared by the [place] FDA and has been authorized for detection and/or diagnosis of SARS-CoV-2 by FDA under an Emergency Use Authorization (EUA). This EUA will remain in effect (meaning this test can be used) for the duration of the COVID-19 declaration under Section 564(b)(1) of the Act, 21 U.S.C. section 360bbb-3(b)(1), unless the authorization is terminated or revoked.  Performed at [facility], [address] [place], [place] [address]   Respiratory (~20 pathogens) panel by PCR     Status: None   Collection Time: 07/01/22 12:57 PM   Specimen: Nasopharyngeal Swab;  Respiratory  Result Value Ref Range Status   Adenovirus NOT DETECTED NOT DETECTED Final   Coronavirus 229E NOT DETECTED NOT DETECTED Final    Comment: (NOTE) The Coronavirus on the Respiratory Panel, DOES NOT test for the novel  Coronavirus (2019 nCoV)    Coronavirus HKU1 NOT DETECTED NOT DETECTED Final   Coronavirus NL63 NOT DETECTED NOT DETECTED Final   Coronavirus OC43 NOT DETECTED NOT DETECTED Final   Metapneumovirus NOT DETECTED NOT DETECTED Final   Rhinovirus / Enterovirus NOT DETECTED NOT DETECTED Final   Influenza A NOT DETECTED NOT DETECTED Final   Influenza B NOT DETECTED NOT DETECTED  Final   Parainfluenza Virus 1 NOT DETECTED NOT DETECTED Final   Parainfluenza Virus 2 NOT DETECTED NOT DETECTED Final   Parainfluenza Virus 3 NOT DETECTED NOT DETECTED Final   Parainfluenza Virus 4 NOT DETECTED NOT DETECTED Final   Respiratory Syncytial Virus NOT DETECTED NOT DETECTED Final   Bordetella pertussis NOT DETECTED NOT DETECTED Final   Bordetella Parapertussis NOT DETECTED NOT DETECTED Final   Chlamydophila pneumoniae NOT DETECTED NOT DETECTED Final   Mycoplasma pneumoniae NOT DETECTED NOT DETECTED Final    Comment: Performed at [facility], [address] [place], [place] [address]  Blood culture (routine x 2)     Status: None (Preliminary result)   Collection Time: 07/01/22  6:28 PM   Specimen: BLOOD  Result Value Ref Range Status   Specimen Description   Final    BLOOD BLOOD LEFT FOREARM Performed at [facility], [address], [place], [place] [address]    Special Requests   Final    Blood Culture adequate volume BOTTLES DRAWN AEROBIC AND ANAEROBIC Performed at [facility], [address], [place], [place] [address]    Culture   Final    NO GROWTH < 24 HOURS Performed at [facility], [address] [place], [place] [address]    Report Status PENDING  Incomplete  MRSA Next Gen by PCR, Nasal     Status: None    Collection Time: 07/01/22  9:15 PM   Specimen: Nasal Mucosa; Nasal Swab  Result Value Ref Range Status   MRSA by PCR Next Gen NOT DETECTED NOT DETECTED Final    Comment: (NOTE) The GeneXpert MRSA Assay (FDA approved for NASAL specimens only), is one component of a comprehensive MRSA colonization surveillance program. It is not intended to diagnose MRSA infection nor to guide or monitor treatment for MRSA infections. Test performance is not FDA approved in patients less than 58 years old. Performed at [facility], [address] [place], [place] [address]       Radiology Studies: DG Chest 2 View  Result Date: 07/03/2022 CLINICAL DATA:  Pneumonia. EXAM: CHEST - 2 VIEW COMPARISON:  AP chest 06/24/2022, 06/30/2022, 03/15/2019; CT chest 01/09/2020 FINDINGS: Status post median sternotomy clamshell sutures. Numerous surgical clips again overlie the central mediastinum. Cardiac silhouette is again mildly enlarged. Mildly decreased lung volumes are similar to prior. The previously seen nodular density overlying the left midlung on 07/01/2022 but not 06/30/2022 is again not well visualized and likely previously represented an acute inflammatory process. There is mild bilateral mid and lower lung interstitial thickening that is similar to 07/01/2022 and 06/30/2022. There is again blunting of the right costophrenic angle, possibly a small right pleural effusion, however no definite pleural effusion is seen posteriorly on lateral view. No pneumothorax. No acute skeletal abnormality. IMPRESSION: 1. The previously seen nodular density overlying the left midlung on 07/01/2022 but not 06/30/2022 is again not well visualized. This likely was an acute evolving focus of infection/pneumonitis. 2. Mild bilateral mid and lower lung interstitial thickening, similar to 07/01/2022 and 06/30/2022. This is compatible with interstitial pulmonary edema. 3. Probable small right pleural effusion. Electronically Signed    By: Neita Garnet M.D.   On: 07/03/2022 09:22   DG Chest Port 1 View  Result Date: 07/01/2022 CLINICAL DATA:  Shortness of breath and cough. Cardioversion yesterday for atrial fibrillation. EXAM: PORTABLE CHEST 1 VIEW COMPARISON:  Multiple exams, including 06/30/2022 12:38 p.m. FINDINGS: Again noted are numerous clips  along the central mediastinum and findings characteristic for prior lung transplant. Right pleural thickening and blunting the right costophrenic angle, cannot exclude right pleural effusion. There is some progressive opacity at the right lung base probably from atelectasis. Mild interstitial accentuation and indistinctness of the pulmonary vasculature, mild edema is not excluded. Compared to the exam from 1 day ago, there is some faint left perihilar opacity including a 1.4 by 2.2 cm nodular opacity which, being new, is most likely due to edema or infection Indistinctness along the left heart border may reflect mild lingular atelectasis. Suspected enlargement of the cardiopericardial silhouette. IMPRESSION: 1. New 1.4 x 2.2 cm nodular opacity in the left perihilar region, most likely due to edema or infection. 2. Progressive opacity at the right lung base, probably from atelectasis. 3. Suspected enlargement of the cardiopericardial silhouette with mild interstitial accentuation and indistinctness of the pulmonary vasculature, favoring interstitial edema. 4. Right pleural thickening and blunting of the right costophrenic angle, probable small right pleural effusion. 5. Postoperative findings characteristic for prior lung transplant. Electronically Signed   By: Van Clines M.D.   On: 07/01/2022 13:12       LOS: 1 day   [facility] Pager on [email or web address]  07/03/2022, 9:33 AM

## 2022-07-03 NOTE — Progress Notes (Addendum)
NAME:  Brent Holloway, MRN:  [id number], DOB:  07-30-1955, LOS: 1 ADMISSION DATE:  07/01/2022, CONSULTATION DATE:  07/02/2022 REFERRING MD:  Dr. Ophelia Charter, [facility], CHIEF COMPLAINT:  Lung Transplant Medicines   History of Present Illness:  66 yo male with hx of ILD had bilateral lung transplant in March 2023 at [facility] was seen in ER on 12/11 with hx of A fib went into A fib on 12/11 and had cardioversion.  He was having cough for several days before this.  He continued to have cough after this and spoke with transplant team who advised him to come back to ER.  CXR showed new infiltrates.  He was started on antibiotics for pneumonia and reports clinical improvement.  PCCM consulted to manage his lung transplant medications.   He had worsening hypoxia in March 2023 and treated with steroids for organizing pneumonia.  He had Rt pleurodesis in April 2023.  He had MSSA in pleural effusion.  Pertinent  Medical History  DVT, Factor 5 Leiden, HTN, ILD  Interim History / Subjective:  Continues to improve on daily basis.  Objective   Blood pressure (!) 151/93, pulse 81, temperature 97.8 F (36.6 C), resp. rate 17, height 5\' 11"  (1.803 m), weight 108.9 kg, SpO2 99 %.        Intake/Output Summary (Last 24 hours) at 07/03/2022 0916 Last data filed at 07/03/2022 0100 Gross per 24 hour  Intake 110.24 ml  Output --  Net 110.24 ml   Filed Weights   07/01/22 1234 07/02/22 1334  Weight: 111 kg 108.9 kg    Examination:  General: Well-nourished well-developed male no acute distress HEENT: MM pink/moist no JVD Neuro: Grossly intact without focal defect CV: Heart sounds are regular PULM: Diminished in the bases  GI: soft, bsx4 active  GU: Extremities: warm/dry, voids negative edema  Skin: no rashes or lesions   Assessment & Plan:   Pneumonia in pt with hx of bilateral lung transplant on chronic immunosuppressive therapy.  Day 3 of antimicrobial therapy Reviewed 2 view chest x-ray Continue  current acyclovir and atovaquone for prophylaxis    Paroxysmal atrial fibrillation. Continue Eliquis and Lopressor    Labs   CBC: Recent Labs  Lab 06/30/22 1229 07/01/22 1300 07/01/22 2115 07/03/22 0259  WBC 11.7* 11.0* 9.8 8.0  NEUTROABS 10.1* 9.6*  --   --   HGB 13.3 13.0 11.7* 12.0*  HCT 38.8* 36.6* 32.9* 34.8*  MCV 87.6 87.6 86.6 88.5  PLT 208 195 178 169    Basic Metabolic Panel: Recent Labs  Lab 06/30/22 1229 07/01/22 1300 07/02/22 0642 07/03/22 0259  NA 136 135 135 139  K 4.9 4.3 3.9 3.9  CL 101 102 101 108  CO2 25 20* 23 20*  GLUCOSE 130* 125* 109* 109*  BUN 38* 38* 30* 28*  CREATININE 1.82* 1.85* 1.80* 1.79*  CALCIUM 10.4* 9.4 9.1 8.8*  MG 1.3* 1.4*  --  1.6*   GFR: Estimated Creatinine Clearance: 50.9 mL/min (A) (by C-G formula based on SCr of 1.79 mg/dL (H)). Recent Labs  Lab 06/30/22 1229 07/01/22 1300 07/01/22 2115 07/02/22 1421 07/03/22 0259  PROCALCITON  --   --   --  <0.10  --   WBC 11.7* 11.0* 9.8  --  8.0    Liver Function Tests: Recent Labs  Lab 07/01/22 1300  AST 13*  ALT 8  ALKPHOS 49  BILITOT 1.4*  PROT 6.4*  ALBUMIN 4.3   Recent Labs  Lab 07/01/22 1300  LIPASE 14  No results for input(s): "AMMONIA" in the last 168 hours.  ABG No results found for: "PHART", "PCO2ART", "PO2ART", "HCO3", "TCO2", "ACIDBASEDEF", "O2SAT"   Coagulation Profile: No results for input(s): "INR", "PROTIME" in the last 168 hours.  Cardiac Enzymes: No results for input(s): "CKTOTAL", "CKMB", "CKMBINDEX", "TROPONINI" in the last 168 hours.  HbA1C: No results found for: "HGBA1C"  CBG: No results for input(s): "GLUCAP" in the last 168 hours.    Signature:  Richardson Landry Minor ACNP Acute Care Nurse Practitioner [place] Please consult Amion 07/03/2022, 9:16 AM   Feels better.  No cough.  On room air.  No fever.  BP (!) 151/93 (BP Location: Right Arm)   Pulse 81   Temp 97.8 F (36.6 C)   Resp 17   Ht 5\' 11"   (1.803 m)   Wt 108.9 kg   SpO2 99%   BMI 33.47 kg/m    General - alert Eyes - pupils reactive ENT - no sinus tenderness, no stridor Cardiac - regular rate/rhythm, no murmur Chest - equal breath sounds b/l, no wheezing or rales Abdomen - soft, non tender, + bowel sounds Extremities - no cyanosis, clubbing, or edema Skin - no rashes Neuro - normal strength, moves extremities, follows commands Psych - normal mood and behavior  DG Chest 2 View  Result Date: 07/03/2022 CLINICAL DATA:  Pneumonia. EXAM: CHEST - 2 VIEW COMPARISON:  AP chest 06/24/2022, 06/30/2022, 03/15/2019; CT chest 01/09/2020 FINDINGS: Status post median sternotomy clamshell sutures. Numerous surgical clips again overlie the central mediastinum. Cardiac silhouette is again mildly enlarged. Mildly decreased lung volumes are similar to prior. The previously seen nodular density overlying the left midlung on 07/01/2022 but not 06/30/2022 is again not well visualized and likely previously represented an acute inflammatory process. There is mild bilateral mid and lower lung interstitial thickening that is similar to 07/01/2022 and 06/30/2022. There is again blunting of the right costophrenic angle, possibly a small right pleural effusion, however no definite pleural effusion is seen posteriorly on lateral view. No pneumothorax. No acute skeletal abnormality. IMPRESSION: 1. The previously seen nodular density overlying the left midlung on 07/01/2022 but not 06/30/2022 is again not well visualized. This likely was an acute evolving focus of infection/pneumonitis. 2. Mild bilateral mid and lower lung interstitial thickening, similar to 07/01/2022 and 06/30/2022. This is compatible with interstitial pulmonary edema. 3. Probable small right pleural effusion. Electronically Signed   By: Yvonne Kendall M.D.   On: 07/03/2022 09:22   DG Chest Port 1 View  Result Date: 07/01/2022 CLINICAL DATA:  Shortness of breath and cough. Cardioversion  yesterday for atrial fibrillation. EXAM: PORTABLE CHEST 1 VIEW COMPARISON:  Multiple exams, including 06/30/2022 12:38 p.m. FINDINGS: Again noted are numerous clips along the central mediastinum and findings characteristic for prior lung transplant. Right pleural thickening and blunting the right costophrenic angle, cannot exclude right pleural effusion. There is some progressive opacity at the right lung base probably from atelectasis. Mild interstitial accentuation and indistinctness of the pulmonary vasculature, mild edema is not excluded. Compared to the exam from 1 day ago, there is some faint left perihilar opacity including a 1.4 by 2.2 cm nodular opacity which, being new, is most likely due to edema or infection Indistinctness along the left heart border may reflect mild lingular atelectasis. Suspected enlargement of the cardiopericardial silhouette. IMPRESSION: 1. New 1.4 x 2.2 cm nodular opacity in the left perihilar region, most likely due to edema or infection. 2. Progressive opacity at the right lung base,  probably from atelectasis. 3. Suspected enlargement of the cardiopericardial silhouette with mild interstitial accentuation and indistinctness of the pulmonary vasculature, favoring interstitial edema. 4. Right pleural thickening and blunting of the right costophrenic angle, probable small right pleural effusion. 5. Postoperative findings characteristic for prior lung transplant. Electronically Signed   By: Van Clines M.D.   On: 07/01/2022 13:12     Assessment/plan:  Community acquired pneumonia. - can transition to augmentin to finish 7 days of antibiotics  Hx of ILD s/p bilateral lung transplant. - he is to continue his outpt immunosuppressant and prophylactic regimen - he will follow up with pulmonary transplant team at [facility] to d/c home from pulmonary standpoint.  Please call if additional assistance needed while he is in hospital.  I spent > 50% of time assessing pt  and formulating treatment plan.  Chesley Mires, MD [facility] Pager - [phone] 07/03/2022, 9:56 AM

## 2022-07-03 NOTE — Discharge Summary (Signed)
[facility]  Physician Discharge Summary   Patient ID: Brent Holloway MRN: [id number] DOB/AGE: 66-Mar-1957 66 y.o.  Admit date: 07/01/2022 Discharge date:   07/03/2022   PCP: Ileana Ladd, MD (Inactive)  DISCHARGE DIAGNOSES:    Pneumonia of left lung due to infectious organism   Leg DVT (deep venous thromboembolism), chronic, right (HCC)   Class 1 obesity due to excess calories with body mass index (BMI) of 34.0 to 34.9 in adult   History of lung transplant [facility])   Essential hypertension   RECOMMENDATIONS FOR OUTPATIENT FOLLOW UP: Patient to follow-up with his transplant doctors at [facility].   Home Health: None Equipment/Devices: None  CODE STATUS: Full code  DISCHARGE CONDITION: fair  Diet recommendation: As before  INITIAL HISTORY:  66 y.o. 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   Patient found to have pneumonia.  Started on antibiotics.  Pulmonology was consulted.     Consultants: Pulmonology   Procedures: None     HOSPITAL COURSE:   Community-acquired pneumonia in immunocompromised patient in the setting of her history of lung transplant Pulmonology is following. COVID-19 test was negative.  Influenza PCR was negative.  Respiratory viral panel unremarkable. Patient was started on Zosyn.  Respiratory status is stable.  Cultures have been negative.  Procalcitonin less than 0.1. Chest x-ray does raise concern for pulm edema though examination does not suggest this.  Saturating normal on room air.  Echocardiogram from March of this year done at [facility] showed normal systolic function.   Seen by pulmonology today and cleared for discharge on Augmentin.   History of lung transplant Continue with immunosuppressants. Patient noted to be on acyclovir, atovaquone, mycophenolate, tacrolimus, prednisone.   History of DVT/factor V [place] Continue Eliquis.   Essential  hypertension Continue metoprolol   Chronic kidney disease stage IIIb/hypomagnesemia Renal function close to baseline. Supplement magnesium.   Obesity  Estimated body mass index is 33.47 kg/m as calculated from the following:   Height as of this encounter: 5\' 11"  (1.803 m).   Weight as of this encounter: 108.9 kg.    Patient is stable.  Okay for discharge home today.  Cleared by pulmonology for  PERTINENT LABS:  The results of significant diagnostics from this hospitalization (including imaging, microbiology, ancillary and laboratory) are listed below for reference.    Microbiology: Recent Results (from the past 240 hour(s))  Resp panel by RT-PCR (RSV, Flu A&B, Covid) Anterior Nasal Swab     Status: None   Collection Time: 07/01/22 12:57 PM   Specimen: Anterior Nasal Swab  Result Value Ref Range Status   SARS Coronavirus 2 by RT PCR NEGATIVE NEGATIVE Final    Comment: (NOTE) SARS-CoV-2 target nucleic acids are NOT DETECTED.  The SARS-CoV-2 RNA is generally detectable in upper respiratory specimens during the acute phase of infection. The lowest concentration of SARS-CoV-2 viral copies this assay can detect is 138 copies/mL. A negative result does not preclude SARS-Cov-2 infection and should not be used as the sole basis for treatment or other patient management decisions. A negative result may occur with  improper specimen collection/handling, submission of specimen other than nasopharyngeal swab, presence of viral mutation(s) within the areas targeted by this assay, and inadequate number of viral copies(<138 copies/mL). A negative result must be combined with clinical observations, patient history, and epidemiological information. The expected result is Negative.  Fact Sheet for Patients:  BloggerCourse.com  Fact Sheet for  Healthcare Providers:  SeriousBroker.it  This test is no t yet approved or cleared by the [place] and  has been authorized for detection and/or diagnosis of SARS-CoV-2 by FDA under an Emergency Use Authorization (EUA). This EUA will remain  in effect (meaning this test can be used) for the duration of the COVID-19 declaration under Section 564(b)(1) of the Act, 21 U.S.C.section 360bbb-3(b)(1), unless the authorization is terminated  or revoked sooner.       Influenza A by PCR NEGATIVE NEGATIVE Final   Influenza B by PCR NEGATIVE NEGATIVE Final    Comment: (NOTE) The Xpert Xpress SARS-CoV-2/FLU/RSV plus assay is intended as an aid in the diagnosis of influenza from Nasopharyngeal swab specimens and should not be used as a sole basis for treatment. Nasal washings and aspirates are unacceptable for Xpert Xpress SARS-CoV-2/FLU/RSV testing.  Fact Sheet for Patients: BloggerCourse.com  Fact Sheet for Healthcare Providers: SeriousBroker.it  This test is not yet approved or cleared by the [place] FDA and has been authorized for detection and/or diagnosis of SARS-CoV-2 by FDA under an Emergency Use Authorization (EUA). This EUA will remain in effect (meaning this test can be used) for the duration of the COVID-19 declaration under Section 564(b)(1) of the Act, 21 U.S.C. section 360bbb-3(b)(1), unless the authorization is terminated or revoked.     Resp Syncytial Virus by PCR NEGATIVE NEGATIVE Final    Comment: (NOTE) Fact Sheet for Patients: BloggerCourse.com  Fact Sheet for Healthcare Providers: SeriousBroker.it  This test is not yet approved or cleared by the [place] FDA and has been authorized for detection and/or diagnosis of SARS-CoV-2 by FDA under an Emergency Use Authorization (EUA). This EUA will remain in effect (meaning this test can be used) for the duration of the COVID-19 declaration under Section 564(b)(1) of the Act, 21 U.S.C. section  360bbb-3(b)(1), unless the authorization is terminated or revoked.  Performed at [facility], [address] [place], [place] [address]   Respiratory (~20 pathogens) panel by PCR     Status: None   Collection Time: 07/01/22 12:57 PM   Specimen: Nasopharyngeal Swab; Respiratory  Result Value Ref Range Status   Adenovirus NOT DETECTED NOT DETECTED Final   Coronavirus 229E NOT DETECTED NOT DETECTED Final    Comment: (NOTE) The Coronavirus on the Respiratory Panel, DOES NOT test for the novel  Coronavirus (2019 nCoV)    Coronavirus HKU1 NOT DETECTED NOT DETECTED Final   Coronavirus NL63 NOT DETECTED NOT DETECTED Final   Coronavirus OC43 NOT DETECTED NOT DETECTED Final   Metapneumovirus NOT DETECTED NOT DETECTED Final   Rhinovirus / Enterovirus NOT DETECTED NOT DETECTED Final   Influenza A NOT DETECTED NOT DETECTED Final   Influenza B NOT DETECTED NOT DETECTED Final   Parainfluenza Virus 1 NOT DETECTED NOT DETECTED Final   Parainfluenza Virus 2 NOT DETECTED NOT DETECTED Final   Parainfluenza Virus 3 NOT DETECTED NOT DETECTED Final   Parainfluenza Virus 4 NOT DETECTED NOT DETECTED Final   Respiratory Syncytial Virus NOT DETECTED NOT DETECTED Final   Bordetella pertussis NOT DETECTED NOT DETECTED Final   Bordetella Parapertussis NOT DETECTED NOT DETECTED Final   Chlamydophila pneumoniae NOT DETECTED NOT DETECTED Final   Mycoplasma pneumoniae NOT DETECTED NOT DETECTED Final    Comment: Performed at [facility], [address] [place], [place] [address]  Blood culture (routine x 2)     Status: None (Preliminary result)   Collection Time: 07/01/22  6:28 PM  Specimen: BLOOD  Result Value Ref Range Status   Specimen Description   Final    BLOOD BLOOD LEFT FOREARM Performed at [facility], [address], [place], [place] [address]    Special Requests   Final    Blood Culture adequate volume BOTTLES DRAWN AEROBIC AND  ANAEROBIC Performed at [facility], [address] [place], [place] [address]    Culture   Final    NO GROWTH 2 DAYS Performed at [facility], [place] [address], [place], [place] [address]    Report Status PENDING  Incomplete  MRSA Next Gen by PCR, Nasal     Status: None   Collection Time: 07/01/22  9:15 PM   Specimen: Nasal Mucosa; Nasal Swab  Result Value Ref Range Status   MRSA by PCR Next Gen NOT DETECTED NOT DETECTED Final    Comment: (NOTE) The GeneXpert MRSA Assay (FDA approved for NASAL specimens only), is one component of a comprehensive MRSA colonization surveillance program. It is not intended to diagnose MRSA infection nor to guide or monitor treatment for MRSA infections. Test performance is not FDA approved in patients less than 29 years old. Performed at [facility], [place] [address], [place], [place] [address]      Labs:   Basic Metabolic Panel: Recent Labs  Lab 06/30/22 1229 07/01/22 1300 07/02/22 0642 07/03/22 0259  NA 136 135 135 139  K 4.9 4.3 3.9 3.9  CL 101 102 101 108  CO2 25 20* 23 20*  GLUCOSE 130* 125* 109* 109*  BUN 38* 38* 30* 28*  CREATININE 1.82* 1.85* 1.80* 1.79*  CALCIUM 10.4* 9.4 9.1 8.8*  MG 1.3* 1.4*  --  1.6*   Liver Function Tests: Recent Labs  Lab 07/01/22 1300  AST 13*  ALT 8  ALKPHOS 49  BILITOT 1.4*  PROT 6.4*  ALBUMIN 4.3   Recent Labs  Lab 07/01/22 1300  LIPASE 14   CBC: Recent Labs  Lab 06/30/22 1229 07/01/22 1300 07/01/22 2115 07/03/22 0259  WBC 11.7* 11.0* 9.8 8.0  NEUTROABS 10.1* 9.6*  --   --   HGB 13.3 13.0 11.7* 12.0*  HCT 38.8* 36.6* 32.9* 34.8*  MCV 87.6 87.6 86.6 88.5  PLT 208 195 178 169    BNP: BNP (last 3 results) Recent Labs    07/01/22 1300  BNP 508.3*      IMAGING STUDIES DG Chest 2 View  Result Date: 07/03/2022 CLINICAL DATA:  Pneumonia. EXAM: CHEST - 2 VIEW COMPARISON:  AP chest 06/24/2022, 06/30/2022, 03/15/2019; CT chest 01/09/2020  FINDINGS: Status post median sternotomy clamshell sutures. Numerous surgical clips again overlie the central mediastinum. Cardiac silhouette is again mildly enlarged. Mildly decreased lung volumes are similar to prior. The previously seen nodular density overlying the left midlung on 07/01/2022 but not 06/30/2022 is again not well visualized and likely previously represented an acute inflammatory process. There is mild bilateral mid and lower lung interstitial thickening that is similar to 07/01/2022 and 06/30/2022. There is again blunting of the right costophrenic angle, possibly a small right pleural effusion, however no definite pleural effusion is seen posteriorly on lateral view. No pneumothorax. No acute skeletal abnormality. IMPRESSION: 1. The previously seen nodular density overlying the left midlung on 07/01/2022 but not 06/30/2022 is again not well visualized. This likely was an acute evolving focus of infection/pneumonitis. 2. Mild bilateral mid and lower lung interstitial thickening, similar to 07/01/2022 and 06/30/2022. This is compatible with interstitial pulmonary edema. 3.  Probable small right pleural effusion. Electronically Signed   By: Neita Garnet M.D.   On: 07/03/2022 09:22   DG Chest Port 1 View  Result Date: 07/01/2022 CLINICAL DATA:  Shortness of breath and cough. Cardioversion yesterday for atrial fibrillation. EXAM: PORTABLE CHEST 1 VIEW COMPARISON:  Multiple exams, including 06/30/2022 12:38 p.m. FINDINGS: Again noted are numerous clips along the central mediastinum and findings characteristic for prior lung transplant. Right pleural thickening and blunting the right costophrenic angle, cannot exclude right pleural effusion. There is some progressive opacity at the right lung base probably from atelectasis. Mild interstitial accentuation and indistinctness of the pulmonary vasculature, mild edema is not excluded. Compared to the exam from 1 day ago, there is some faint left perihilar  opacity including a 1.4 by 2.2 cm nodular opacity which, being new, is most likely due to edema or infection Indistinctness along the left heart border may reflect mild lingular atelectasis. Suspected enlargement of the cardiopericardial silhouette. IMPRESSION: 1. New 1.4 x 2.2 cm nodular opacity in the left perihilar region, most likely due to edema or infection. 2. Progressive opacity at the right lung base, probably from atelectasis. 3. Suspected enlargement of the cardiopericardial silhouette with mild interstitial accentuation and indistinctness of the pulmonary vasculature, favoring interstitial edema. 4. Right pleural thickening and blunting of the right costophrenic angle, probable small right pleural effusion. 5. Postoperative findings characteristic for prior lung transplant. Electronically Signed   By: Gaylyn Rong M.D.   On: 07/01/2022 13:12   DG Chest Port 1 View  Result Date: 06/30/2022 CLINICAL DATA:  Atrial fibrillation. Fatigue with shortness of breath for 1 week. Previous bilateral lung transplant 9 months ago. EXAM: PORTABLE CHEST 1 VIEW COMPARISON:  Chest radiographs 01/13/2019.  CT 01/09/2019. FINDINGS: 1238 hours. Interval postsurgical changes consistent with bilateral lung transplant. The heart size and mediastinal contours are stable. There are low lung volumes with probable mild atelectasis at both lung bases. There are probable postsurgical changes in the left lung base. No edema, confluent airspace opacity, pleural effusion or pneumothorax. No acute osseous findings are evident. Telemetry leads overlie the chest. IMPRESSION: Interval postsurgical changes consistent with bilateral lung transplant. Probable mild bibasilar atelectasis. No other acute cardiopulmonary process. Electronically Signed   By: Carey Bullocks M.D.   On: 06/30/2022 12:53    DISCHARGE EXAMINATION: See progress note from earlier today  DISPOSITION: Home  Discharge Instructions     Call MD for:   difficulty breathing, headache or visual disturbances   Complete by: As directed    Call MD for:  extreme fatigue   Complete by: As directed    Call MD for:  persistant dizziness or light-headedness   Complete by: As directed    Call MD for:  persistant nausea and vomiting   Complete by: As directed    Call MD for:  severe uncontrolled pain   Complete by: As directed    Call MD for:  temperature >100.4   Complete by: As directed    Diet - low sodium heart healthy   Complete by: As directed    Discharge instructions   Complete by: As directed    Please be sure to follow-up with your primary care provider and your transplant providers within 1 week.  Take your medications as prescribed.  You were cared for by a hospitalist during your hospital stay. If you have any questions about your discharge medications or the care you received while you were in the hospital after you are discharged,  you can call the unit and asked to speak with the hospitalist on call if the hospitalist that took care of you is not available. Once you are discharged, your primary care physician will handle any further medical issues. Please note that NO REFILLS for any discharge medications will be authorized once you are discharged, as it is imperative that you return to your primary care physician (or establish a relationship with a primary care physician if you do not have one) for your aftercare needs so that they can reassess your need for medications and monitor your lab values. If you do not have a primary care physician, you can call [phone] for a physician referral.   Increase activity slowly   Complete by: As directed           Allergies as of 07/03/2022   No Known Allergies      Medication List     TAKE these medications    acyclovir 400 MG tablet Commonly known as: ZOVIRAX Take 400 mg by mouth 2 (two) times daily.   amoxicillin-clavulanate 875-125 MG tablet Commonly known as: AUGMENTIN Take  1 tablet by mouth every 12 (twelve) hours for 5 days.   atovaquone 750 MG/5ML suspension Commonly known as: MEPRON Take by mouth.   Eliquis 5 MG Tabs tablet Generic drug: apixaban Take 5 mg by mouth 2 (two) times daily.   famotidine 10 MG tablet Commonly known as: PEPCID Take 10 mg by mouth daily.   guaiFENesin 600 MG 12 hr tablet Commonly known as: MUCINEX Take 1 tablet (600 mg total) by mouth 2 (two) times daily as needed for cough.   magnesium oxide 400 MG tablet Commonly known as: MAG-OX Take 1 tablet (400 mg total) by mouth 2 (two) times daily for 5 days.   metoprolol tartrate 25 MG tablet Commonly known as: LOPRESSOR Take 1.5 tablets by mouth every 12 (twelve) hours.   MG-PLUS PROTEIN PO Take 3 tablets by mouth 2 (two) times daily.   mycophenolate 250 MG capsule Commonly known as: CELLCEPT Take 1,000 mg by mouth 2 (two) times daily.   omeprazole 20 MG capsule Commonly known as: PRILOSEC TAKE 1 CAPSULE BY MOUTH  DAILY   prednisoLONE 5 MG Tabs tablet Take 5 mg by mouth daily.   tacrolimus 1 MG capsule Commonly known as: PROGRAF Take 3 mg by mouth 2 (two) times daily.   VITAMIN D (CHOLECALCIFEROL) PO Take 1 tablet by mouth daily.           TOTAL DISCHARGE TIME: 35 minutes  Marshayla Mitschke [employer] on [email or web address]  07/03/2022, 11:42 AM

## 2022-07-06 LAB — CULTURE, BLOOD (ROUTINE X 2)
Culture: NO GROWTH
Special Requests: ADEQUATE

## 2022-07-07 LAB — CULTURE, BLOOD (ROUTINE X 2)
Culture: NO GROWTH
Special Requests: ADEQUATE

## 2022-07-24 ENCOUNTER — Inpatient Hospital Stay (HOSPITAL_BASED_OUTPATIENT_CLINIC_OR_DEPARTMENT_OTHER): Payer: Medicare Other | Admitting: Pulmonary Disease

## 2022-09-12 ENCOUNTER — Other Ambulatory Visit: Payer: Self-pay

## 2022-09-12 ENCOUNTER — Encounter (HOSPITAL_BASED_OUTPATIENT_CLINIC_OR_DEPARTMENT_OTHER): Payer: Self-pay | Admitting: Emergency Medicine

## 2022-09-12 ENCOUNTER — Emergency Department (HOSPITAL_BASED_OUTPATIENT_CLINIC_OR_DEPARTMENT_OTHER)
Admission: EM | Admit: 2022-09-12 | Discharge: 2022-09-12 | Disposition: A | Discharge status: Home / Self Care | Payer: Medicare Other | Attending: Emergency Medicine | Admitting: Emergency Medicine

## 2022-09-12 DIAGNOSIS — Z86718 Personal history of other venous thrombosis and embolism: Secondary | ICD-10-CM | POA: Insufficient documentation

## 2022-09-12 DIAGNOSIS — I10 Essential (primary) hypertension: Secondary | ICD-10-CM | POA: Diagnosis not present

## 2022-09-12 DIAGNOSIS — Z79899 Other long term (current) drug therapy: Secondary | ICD-10-CM | POA: Insufficient documentation

## 2022-09-12 DIAGNOSIS — R002 Palpitations: Secondary | ICD-10-CM

## 2022-09-12 DIAGNOSIS — Z7901 Long term (current) use of anticoagulants: Secondary | ICD-10-CM | POA: Diagnosis not present

## 2022-09-12 DIAGNOSIS — I4891 Unspecified atrial fibrillation: Secondary | ICD-10-CM | POA: Insufficient documentation

## 2022-09-12 LAB — TSH: TSH: 3.289 u[IU]/mL (ref 0.350–4.500)

## 2022-09-12 LAB — CBC
HCT: 40.6 % (ref 39.0–52.0)
Hemoglobin: 14.4 g/dL (ref 13.0–17.0)
MCH: 30.8 pg (ref 26.0–34.0)
MCHC: 35.5 g/dL (ref 30.0–36.0)
MCV: 86.9 fL (ref 80.0–100.0)
Platelets: 256 10*3/uL (ref 150–400)
RBC: 4.67 MIL/uL (ref 4.22–5.81)
RDW: 14.6 % (ref 11.5–15.5)
WBC: 12 10*3/uL — ABNORMAL HIGH (ref 4.0–10.5)
nRBC: 0 % (ref 0.0–0.2)

## 2022-09-12 LAB — COMPREHENSIVE METABOLIC PANEL
ALT: 5 U/L (ref 0–44)
AST: 13 U/L — ABNORMAL LOW (ref 15–41)
Albumin: 4.6 g/dL (ref 3.5–5.0)
Alkaline Phosphatase: 49 U/L (ref 38–126)
Anion gap: 10 (ref 5–15)
BUN: 32 mg/dL — ABNORMAL HIGH (ref 8–23)
CO2: 23 mmol/L (ref 22–32)
Calcium: 10.5 mg/dL — ABNORMAL HIGH (ref 8.9–10.3)
Chloride: 101 mmol/L (ref 98–111)
Creatinine, Ser: 2.12 mg/dL — ABNORMAL HIGH (ref 0.61–1.24)
GFR, Estimated: 34 mL/min — ABNORMAL LOW (ref >60)
Glucose, Bld: 111 mg/dL — ABNORMAL HIGH (ref 70–99)
Potassium: 5 mmol/L (ref 3.5–5.1)
Sodium: 134 mmol/L — ABNORMAL LOW (ref 135–145)
Total Bilirubin: 0.7 mg/dL (ref 0.3–1.2)
Total Protein: 7.1 g/dL (ref 6.5–8.1)

## 2022-09-12 LAB — MAGNESIUM: Magnesium: 1.5 mg/dL — ABNORMAL LOW (ref 1.7–2.4)

## 2022-09-12 MED ORDER — SODIUM CHLORIDE 0.9 % IV BOLUS
500.0000 mL | Freq: Once | INTRAVENOUS | Status: AC
  Administered 2022-09-12: 500 mL via INTRAVENOUS

## 2022-09-12 NOTE — Discharge Instructions (Signed)
Your history, exam, workup today confirmed you were in fact in atrial fibrillation and the fast version of it called RVR when you arrived but then after some fluids and monitoring it resolved.  You then prove stability for several hours and we agree that you feel safe for discharge home.  Your labs did show a bump in your creatinine with your kidney function as we discussed and please increase your hydration.  Your magnesium is also low so please continue taking magnesium supplementation.  Please follow-up with your primary team and transplant team and if any symptoms change or worsen or recur, please return to the nearest emergency department.

## 2022-09-12 NOTE — ED Provider Notes (Signed)
[facility] Provider Note   CSN: [id number] Arrival date & time: 09/12/22  1638     History  Chief Complaint  Patient presents with   Atrial Fibrillation    Brent Holloway is a 67 y.o. male.  The history is provided by the patient and medical records. No language interpreter was used.  Atrial Fibrillation This is a recurrent problem. The current episode started 12 to 24 hours ago. The problem occurs constantly. The problem has not changed since onset.Pertinent negatives include no chest pain, no abdominal pain, no headaches and no shortness of breath. Nothing aggravates the symptoms. Nothing relieves the symptoms. He has tried nothing for the symptoms. The treatment provided no relief.       Home Medications Prior to Admission medications   Medication Sig Start Date End Date Taking? Authorizing Provider  acyclovir (ZOVIRAX) 400 MG tablet Take 400 mg by mouth 2 (two) times daily. 06/23/22   [provider]  apixaban (ELIQUIS) 5 MG TABS tablet Take 5 mg by mouth 2 (two) times daily.    [provider]  atovaquone (MEPRON) 750 MG/5ML suspension Take by mouth. 06/23/22   [provider]  famotidine (PEPCID) 10 MG tablet Take 10 mg by mouth daily.    [provider]  guaiFENesin (MUCINEX) 600 MG 12 hr tablet Take 1 tablet (600 mg total) by mouth 2 (two) times daily as needed for cough. 07/03/22   Bonnielee Haff, MD  metoprolol tartrate (LOPRESSOR) 25 MG tablet Take 1.5 tablets by mouth every 12 (twelve) hours. 05/19/22   [provider]  mycophenolate (CELLCEPT) 250 MG capsule Take 1,000 mg by mouth 2 (two) times daily.    [provider]  omeprazole (PRILOSEC) 20 MG capsule TAKE 1 CAPSULE BY MOUTH  DAILY 02/06/21   Margaretha Seeds, MD  prednisoLONE 5 MG TABS tablet Take 5 mg by mouth daily.    [provider]  Specialty Vitamins Products (MG-PLUS PROTEIN PO) Take 3 tablets by mouth  2 (two) times daily.    [provider]  tacrolimus (PROGRAF) 1 MG capsule Take 3 mg by mouth 2 (two) times daily.    [provider]  VITAMIN D, CHOLECALCIFEROL, PO Take 1 tablet by mouth daily.    [provider]      Allergies    Patient has no known allergies.    Review of Systems   Review of Systems  Constitutional:  Positive for fatigue. Negative for chills and fever.  Eyes:  Negative for visual disturbance.  Respiratory:  Negative for cough, chest tightness and shortness of breath.   Cardiovascular:  Positive for palpitations. Negative for chest pain and leg swelling (unchanged from baseline per pt).  Gastrointestinal:  Negative for abdominal pain, constipation, diarrhea, nausea and vomiting.  Musculoskeletal:  Negative for back pain.  Skin:  Negative for rash and wound.  Neurological:  Positive for light-headedness. Negative for dizziness, weakness, numbness and headaches.  Psychiatric/Behavioral:  Negative for agitation and confusion.   All other systems reviewed and are negative.   Physical Exam Updated Vital Signs BP 116/89 (BP Location: Left Arm)   Pulse (!) 56   Temp 98.1 F (36.7 C) (Oral)   Resp 18   Ht '5\' 11"'$  (1.803 m)   Wt 108.9 kg   SpO2 100%   BMI 33.48 kg/m  Physical Exam Vitals and nursing note reviewed.  Constitutional:      General: He is not in acute distress.  Appearance: He is well-developed. He is not ill-appearing, toxic-appearing or diaphoretic.  HENT:     Head: Normocephalic and atraumatic.     Nose: Nose normal. No congestion or rhinorrhea.     Mouth/Throat:     Mouth: Mucous membranes are dry.  Eyes:     Conjunctiva/sclera: Conjunctivae normal.     Pupils: Pupils are equal, round, and reactive to light.  Cardiovascular:     Rate and Rhythm: Tachycardia present. Rhythm irregular.     Heart sounds: No murmur heard. Pulmonary:     Effort: Pulmonary effort is normal. No respiratory distress.     Breath  sounds: Normal breath sounds. No wheezing, rhonchi or rales.  Chest:     Chest wall: No tenderness.  Abdominal:     General: Abdomen is flat.     Palpations: Abdomen is soft.     Tenderness: There is no abdominal tenderness. There is no right CVA tenderness, left CVA tenderness, guarding or rebound.  Musculoskeletal:        General: No swelling or tenderness.     Cervical back: Neck supple. No tenderness.     Right lower leg: Edema present.     Left lower leg: No edema.  Skin:    General: Skin is warm and dry.     Capillary Refill: Capillary refill takes less than 2 seconds.     Findings: No erythema or rash.  Neurological:     General: No focal deficit present.     Mental Status: He is alert.     Sensory: No sensory deficit.     Motor: No weakness.  Psychiatric:        Mood and Affect: Mood normal.     ED Results / Procedures / Treatments   Labs (all labs ordered are listed, but only abnormal results are displayed) Labs Reviewed  CBC - Abnormal; Notable for the following components:      Result Value   WBC 12.0 (*)    All other components within normal limits  COMPREHENSIVE METABOLIC PANEL - Abnormal; Notable for the following components:   Sodium 134 (*)    Glucose, Bld 111 (*)    BUN 32 (*)    Creatinine, Ser 2.12 (*)    Calcium 10.5 (*)    AST 13 (*)    GFR, Estimated 34 (*)    All other components within normal limits  MAGNESIUM - Abnormal; Notable for the following components:   Magnesium 1.5 (*)    All other components within normal limits  TSH    EKG EKG Interpretation  Date/Time:  Friday September 12 2022 18:48:13 EST Ventricular Rate:  74 PR Interval:  165 QRS Duration: 86 QT Interval:  362 QTC Calculation: 402 R Axis:   -21 Text Interpretation: Sinus rhythm Borderline left axis deviation Consider anterior infarct when compared to prior ECG earlier tonight, now sinus. No STEMI Confirmed by Antony Blackbird [phone]) on 09/12/2022 7:07:47 PM  EKG  Interpretation  Date/Time:  Friday September 12 2022 16:44:34 EST Ventricular Rate:  110 PR Interval:    QRS Duration: 82 QT Interval:  326 QTC Calculation: 441 R Axis:   33 Text Interpretation: Atrial flutter with variable A-V block Anterior infarct (cited on or before 01-Jul-2022) Abnormal ECG When compared with ECG of 01-Jul-2022 12:37, Atrial flutter has replaced Sinus rhythm when compred to most recent ECG, now back in aflutter. No STEMI Confirmed by Antony Blackbird [phone]) on 09/12/2022 5:07:13 PM  Radiology No results found.  Procedures Procedures    Medications Ordered in ED Medications  sodium chloride 0.9 % bolus 500 mL (0 mLs Intravenous Stopped 09/12/22 1835)    ED Course/ Medical Decision Making/ A&P                             Medical Decision Making Amount and/or Complexity of Data Reviewed Labs: ordered.    Brent Holloway is a 67 y.o. male with a past medical history significant for hypertension, factor V Leiden mutation, previous DVT, previous A-fib, and pulmonary fibrosis status post lung transplant who presents with recurrent palpitations.  According to patient, about 2 weeks ago during [employer], he had onset of palpitations and fast heart rate and felt he was back in A-fib.  He said that it then went away and his heart rate has not been fast abnormally since then.  He reports that this morning he noticed the fast palpitations and heart rate again and felt lightheaded.  No syncope and no chest pain.  No shortness of breath.  No other complaints reported.  He called his team and told him to come in for evaluation as it has not improved.  Patient reports he takes anticoagulation for his clot and denies any change in chronic right leg swelling.  Does not feel like there is new clot.  Denies any abdominal pain, nausea, vomiting, constipation, diarrhea, or urinary changes.  Patient reports she has had 3 times he has been electrocardioverted for in the past.  On exam,  lungs clear.  Chest nontender.  Abdomen nontender.  Good pulses in extremities.  Legs nontender.  Right leg is slightly more edematous than the left but he reports this is chronic from his previous DVT.  Otherwise patient well-appearing.  Heart rate is around 130 and appears to be in atrial flutter.  Patient says that he has not checked his rhythm, just the rate over the last 2 weeks so is unclear if he has been in A-fib this entire time and is intermittently having the RVR that he is feeling with palpitations and fast rate.  Patient also says that the first time this happened was right after he had his lung transplant surgery.  He said that he just had reflux surgery 2 weeks ago and is unsure if that has contributed to this at all.  We agreed to give him some fluids to see if the rate improves and get screening labs to look for egregious electrolyte abnormalities.  If workup is reassuring, he may be candidate for ED cardioversion again and follow-up with his primary team.  Anticipate reassessment after labs.  7:06 PM After fluids and reassessment, patient is now what appears to be in sinus rhythm.  Patient proved stability without going back into A-fib.  He is feeling well without any symptoms.  Patient's labs returned similar to prior although his creatinine was slightly more elevated than prior and we discussed this and he will call his transplant team tomorrow to get seen next week and increase his hydration.  His magnesium is also similar with him in the past and low which she manages at home.  They had no other questions or concerns and patient is feeling well.  He will continue his home medications and follow-up with his primary team.  Patient discharged in good condition after his atrial fibrillation resolved in the ED.         Final Clinical Impression(s) / ED Diagnoses Final  diagnoses:  Atrial fibrillation with RVR (HCC)  Palpitations  Hypomagnesemia    Clinical Impression: 1.  Atrial fibrillation with RVR (HCC)   2. Palpitations   3. Hypomagnesemia     Disposition: Discharge  Condition: Good  I have discussed the results, Dx and Tx plan with the pt(& family if present). He/she/they expressed understanding and agree(s) with the plan. Discharge instructions discussed at great length. Strict return precautions discussed and pt &/or family have verbalized understanding of the instructions. No further questions at time of discharge.    New Prescriptions   No medications on file    Follow Up: Vernie Shanks, MD [place] [address] [place] [facility] [place] [phone]          Demitrios Molyneux, Gwenyth Allegra, MD 09/12/22 2031

## 2022-09-12 NOTE — ED Triage Notes (Signed)
Pt arrives to ED with c/o atrial fibrillation with rapid rate that started this morning when pt felt his heart starting to race.

## 2022-09-15 ENCOUNTER — Telehealth (HOSPITAL_COMMUNITY): Payer: Self-pay

## 2022-09-15 NOTE — Telephone Encounter (Signed)
Called to offer patient ED follow up appointment to be seen at the [facility]. Patient declined.

## 2023-01-10 ENCOUNTER — Emergency Department (HOSPITAL_BASED_OUTPATIENT_CLINIC_OR_DEPARTMENT_OTHER): Payer: Medicare Other

## 2023-01-10 ENCOUNTER — Encounter (HOSPITAL_BASED_OUTPATIENT_CLINIC_OR_DEPARTMENT_OTHER): Payer: Self-pay

## 2023-01-10 ENCOUNTER — Emergency Department (HOSPITAL_BASED_OUTPATIENT_CLINIC_OR_DEPARTMENT_OTHER)
Admission: EM | Admit: 2023-01-10 | Discharge: 2023-01-10 | Disposition: A | Discharge status: Other Acute Inpatient Hospital | Payer: Medicare Other | Attending: Emergency Medicine | Admitting: Emergency Medicine

## 2023-01-10 DIAGNOSIS — R0602 Shortness of breath: Secondary | ICD-10-CM | POA: Diagnosis present

## 2023-01-10 DIAGNOSIS — Z7901 Long term (current) use of anticoagulants: Secondary | ICD-10-CM | POA: Diagnosis not present

## 2023-01-10 DIAGNOSIS — E871 Hypo-osmolality and hyponatremia: Secondary | ICD-10-CM | POA: Diagnosis not present

## 2023-01-10 DIAGNOSIS — Z8616 Personal history of COVID-19: Secondary | ICD-10-CM | POA: Insufficient documentation

## 2023-01-10 DIAGNOSIS — J189 Pneumonia, unspecified organism: Secondary | ICD-10-CM | POA: Diagnosis not present

## 2023-01-10 DIAGNOSIS — Z942 Lung transplant status: Secondary | ICD-10-CM | POA: Insufficient documentation

## 2023-01-10 LAB — COMPREHENSIVE METABOLIC PANEL
ALT: 12 U/L (ref 0–44)
AST: 17 U/L (ref 15–41)
Albumin: 4.1 g/dL (ref 3.5–5.0)
Alkaline Phosphatase: 66 U/L (ref 38–126)
Anion gap: 12 (ref 5–15)
BUN: 43 mg/dL — ABNORMAL HIGH (ref 8–23)
CO2: 23 mmol/L (ref 22–32)
Calcium: 9.8 mg/dL (ref 8.9–10.3)
Chloride: 93 mmol/L — ABNORMAL LOW (ref 98–111)
Creatinine, Ser: 2.17 mg/dL — ABNORMAL HIGH (ref 0.61–1.24)
GFR, Estimated: 33 mL/min — ABNORMAL LOW (ref >60)
Glucose, Bld: 136 mg/dL — ABNORMAL HIGH (ref 70–99)
Potassium: 4.3 mmol/L (ref 3.5–5.1)
Sodium: 128 mmol/L — ABNORMAL LOW (ref 135–145)
Total Bilirubin: 0.8 mg/dL (ref 0.3–1.2)
Total Protein: 7.1 g/dL (ref 6.5–8.1)

## 2023-01-10 LAB — CBC WITH DIFFERENTIAL/PLATELET
Abs Immature Granulocytes: 0.25 10*3/uL — ABNORMAL HIGH (ref 0.00–0.07)
Basophils Absolute: 0 10*3/uL (ref 0.0–0.1)
Basophils Relative: 0 %
Eosinophils Absolute: 0 10*3/uL (ref 0.0–0.5)
Eosinophils Relative: 0 %
HCT: 32.1 % — ABNORMAL LOW (ref 39.0–52.0)
Hemoglobin: 11.5 g/dL — ABNORMAL LOW (ref 13.0–17.0)
Immature Granulocytes: 2 %
Lymphocytes Relative: 1 %
Lymphs Abs: 0.1 10*3/uL — ABNORMAL LOW (ref 0.7–4.0)
MCH: 30.2 pg (ref 26.0–34.0)
MCHC: 35.8 g/dL (ref 30.0–36.0)
MCV: 84.3 fL (ref 80.0–100.0)
Monocytes Absolute: 0.9 10*3/uL (ref 0.1–1.0)
Monocytes Relative: 8 %
Neutro Abs: 9 10*3/uL — ABNORMAL HIGH (ref 1.7–7.7)
Neutrophils Relative %: 89 %
Platelets: 245 10*3/uL (ref 150–400)
RBC: 3.81 MIL/uL — ABNORMAL LOW (ref 4.22–5.81)
RDW: 13.6 % (ref 11.5–15.5)
WBC: 10.2 10*3/uL (ref 4.0–10.5)
nRBC: 0 % (ref 0.0–0.2)

## 2023-01-10 LAB — PROTIME-INR
INR: 1.7 — ABNORMAL HIGH (ref 0.8–1.2)
Prothrombin Time: 19.8 seconds — ABNORMAL HIGH (ref 11.4–15.2)

## 2023-01-10 LAB — LACTIC ACID, PLASMA
Lactic Acid, Venous: 1 mmol/L (ref 0.5–1.9)
Lactic Acid, Venous: 0.6 mmol/L (ref 0.5–1.9)

## 2023-01-10 MED ORDER — ACETAMINOPHEN 500 MG PO TABS
1000.0000 mg | ORAL_TABLET | Freq: Once | ORAL | Status: AC
  Administered 2023-01-10: 1000 mg via ORAL
  Filled 2023-01-10: qty 2

## 2023-01-10 MED ORDER — SODIUM CHLORIDE 0.9 % IV SOLN
1.0000 g | Freq: Once | INTRAVENOUS | Status: AC
  Administered 2023-01-10: 1 g via INTRAVENOUS

## 2023-01-10 MED ORDER — VANCOMYCIN HCL IN DEXTROSE 1-5 GM/200ML-% IV SOLN
1000.0000 mg | Freq: Once | INTRAVENOUS | Status: AC
  Administered 2023-01-10: 1000 mg via INTRAVENOUS
  Filled 2023-01-10: qty 200

## 2023-01-10 MED ORDER — VANCOMYCIN HCL 1750 MG/350ML IV SOLN
1750.0000 mg | INTRAVENOUS | Status: DC
  Filled 2023-01-10: qty 350

## 2023-01-10 NOTE — ED Notes (Signed)
Called [facility] for transport.  Pt is going to [facility] to Bed 7805  Report number is [phone]  Onalee Hua states that he will give [place] a call when he has a ETA for transport   Park Meo is person who set up the hospital info

## 2023-01-10 NOTE — ED Notes (Signed)
Called [facility] for consult with Lung Transplant team for Dr. Rosalia Hammers

## 2023-01-10 NOTE — ED Notes (Signed)
Pt transferred to [facility] Transplant unit.

## 2023-01-10 NOTE — ED Provider Notes (Signed)
[facility]rent Hollow[facility] EMERGENCY DEPARTMENT AT [facility] Provider Note   CSN: [id number] Arrival date & time: 01/10/23  1114     History  Chief Complaint  Patient presents with   Shortness of Breath    Brent Holloway is a 67 y.o. male.  HPI 67 year old male presents today complaining of cough, fever, chills, dyspnea.  Patient had bilateral lung transplant in 2023.  He is taking his immunosuppression medications as prescribed.  He had COVID the beginning of May and had bronchoscopy done in early June.  At that time he was positive for Staph aureus and was placed on Keflex.  He was on Keflex until last week.  He stopped the Keflex as per plan.  He began having some return of symptoms and was restarted on the Keflex on Thursday.  During the past 24 hours he has had increased dyspnea, ongoing cough, shaking chills, sweating, and temp to 102 at home today.  He presents to the ED for evaluation     Home Medications Prior to Admission medications   Medication Sig Start Date End Date Taking? Authorizing Provider  acyclovir (ZOVIRAX) 400 MG tablet Take 400 mg by mouth 2 (two) times daily. 06/23/22  Yes [provider]  apixaban (ELIQUIS) 5 MG TABS tablet Take 5 mg by mouth 2 (two) times daily.   Yes [provider]  atovaquone (MEPRON) 750 MG/5ML suspension Take by mouth. 06/23/22  Yes [provider]  famotidine (PEPCID) 10 MG tablet Take 10 mg by mouth daily.   Yes [provider]  guaiFENesin (MUCINEX) 600 MG 12 hr tablet Take 1 tablet (600 mg total) by mouth 2 (two) times daily as needed for cough. 07/03/22  Yes Osvaldo Shipper, MD  metoprolol tartrate (LOPRESSOR) 25 MG tablet Take 1.5 tablets by mouth every 12 (twelve) hours. 05/19/22  Yes [provider]  mycophenolate (CELLCEPT) 250 MG capsule Take 1,000 mg by mouth 2 (two) times daily.   Yes [provider]  Specialty Vitamins Products (MG-PLUS PROTEIN PO) Take 3 tablets by mouth 2 (two)  times daily.   Yes [provider]  tacrolimus (PROGRAF) 1 MG capsule Take 3 mg by mouth 2 (two) times daily.   Yes [provider]  VITAMIN D, CHOLECALCIFEROL, PO Take 1 tablet by mouth daily.   Yes [provider]  omeprazole (PRILOSEC) 20 MG capsule TAKE 1 CAPSULE BY MOUTH  DAILY 02/06/21   Luciano Cutter, MD  prednisoLONE 5 MG TABS tablet Take 5 mg by mouth daily.    [provider]      Allergies    Patient has no known allergies.    Review of Systems   Review of Systems  Physical Exam Updated Vital Signs BP (!) 152/107   Pulse 91   Temp 99.8 F (37.7 C) (Oral)   Resp 17   Ht 1.803 m (5\' 11" )   Wt 108.9 kg   SpO2 99%   BMI 33.47 kg/m  Physical Exam Vitals and nursing note reviewed.  Constitutional:      General: He is not in acute distress.    Appearance: He is well-developed. He is not ill-appearing.  HENT:     Head: Normocephalic.     Mouth/Throat:     Mouth: Mucous membranes are moist.  Eyes:     Pupils: Pupils are equal, round, and reactive to light.  Cardiovascular:     Rate and Rhythm: Normal rate and regular rhythm.  Pulmonary:     Effort:  Pulmonary effort is normal.     Breath sounds: Normal breath sounds.  Abdominal:     General: Bowel sounds are normal.     Palpations: Abdomen is soft.  Musculoskeletal:        General: Normal range of motion.     Cervical back: Normal range of motion.  Skin:    General: Skin is warm and dry.     Capillary Refill: Capillary refill takes less than 2 seconds.  Neurological:     General: No focal deficit present.     Mental Status: He is alert.  Psychiatric:        Mood and Affect: Mood normal.     ED Results / Procedures / Treatments   Labs (all labs ordered are listed, but only abnormal results are displayed) Labs Reviewed  COMPREHENSIVE METABOLIC PANEL - Abnormal; Notable for the following components:      Result Value   Sodium 128 (*)    Chloride 93 (*)    Glucose,  Bld 136 (*)    BUN 43 (*)    Creatinine, Ser 2.17 (*)    GFR, Estimated 33 (*)    All other components within normal limits  CBC WITH DIFFERENTIAL/PLATELET - Abnormal; Notable for the following components:   RBC 3.81 (*)    Hemoglobin 11.5 (*)    HCT 32.1 (*)    Neutro Abs 9.0 (*)    Lymphs Abs 0.1 (*)    Abs Immature Granulocytes 0.25 (*)    All other components within normal limits  PROTIME-INR - Abnormal; Notable for the following components:   Prothrombin Time 19.8 (*)    INR 1.7 (*)    All other components within normal limits  CULTURE, BLOOD (ROUTINE X 2)  CULTURE, BLOOD (ROUTINE X 2)  LACTIC ACID, PLASMA  LACTIC ACID, PLASMA    EKG EKG Interpretation  Date/Time:  Saturday January 10 2023 11:33:30 EDT Ventricular Rate:  94 PR Interval:  162 QRS Duration: 78 QT Interval:  328 QTC Calculation: 410 R Axis:   1 Text Interpretation: Normal sinus rhythm Right atrial enlargement Anterior infarct , age undetermined Abnormal ECG When compared with ECG of 12-Sep-2022 18:48, PREVIOUS ECG IS PRESENT Confirmed by Margarita Grizzle [phone]) on 01/10/2023 2:05:54 PM  Radiology CT Chest Wo Contrast  Result Date: 01/10/2023 CLINICAL DATA:  Lung transplant, immunosuppression, fever, staff culture from recent bronchoscopy, on Keflex EXAM: CT CHEST WITHOUT CONTRAST TECHNIQUE: Multidetector CT imaging of the chest was performed following the standard protocol without IV contrast. RADIATION DOSE REDUCTION: This exam was performed according to the departmental dose-optimization program which includes automated exposure control, adjustment of the mA and/or kV according to patient size and/or use of iterative reconstruction technique. COMPARISON:  01/09/2020 FINDINGS: Cardiovascular: Aortic atherosclerosis. Cardiomegaly. Left coronary artery calcifications. Gross enlargement of the main pulmonary artery, measuring up to 4.4 cm in caliber. No pericardial effusion. Mediastinum/Nodes: No enlarged mediastinal,  hilar, or axillary lymph nodes. Thyroid gland, trachea, and esophagus demonstrate no significant findings. Lungs/Pleura: Status post bilateral lung transplantation. Extensive, nodular heterogeneous and ground-glass airspace opacity throughout the anterior left upper lobe and lingula (series 4, image 69). Additional clustered nodularity in the right middle lobe (series 4, image 88) and dependent bilateral lower lobes (series 4, image 101). No pleural effusion or pneumothorax. Upper Abdomen: No acute abnormality. Multiple fluid attenuation cysts throughout the liver, benign. Benign adenomatous hypertrophy of the adrenal glands for which no further follow-up or characterization required. Musculoskeletal: No chest wall abnormality. No  acute osseous findings. IMPRESSION: 1. Extensive, nodular heterogeneous and ground-glass airspace opacity throughout the anterior left upper lobe and lingula. Additional clustered nodularity in the right middle lobe and dependent bilateral lower lobes. Findings are consistent with multifocal infection. 2. Status post bilateral lung transplantation. 3. Cardiomegaly and coronary artery disease. 4. Gross enlargement of the main pulmonary artery, measuring up to 4.4 cm in caliber, as can be seen in pulmonary hypertension. Aortic Atherosclerosis (ICD10-I70.0). Electronically Signed   By: Jearld Lesch M.D.   On: 01/10/2023 14:24   DG Chest Port 1 View  Result Date: 01/10/2023 CLINICAL DATA:  Atrial fibrillation. EXAM: PORTABLE CHEST 1 VIEW COMPARISON:  July 02, 2022. FINDINGS: Stable cardiomediastinal silhouette. Bony thorax is unremarkable. Minimal right basilar atelectasis or scarring is noted. Multiple nodular opacities are noted in the left midlung which appear to be more prominent compared to prior exam. Left basilar scarring or postoperative changes noted as well. IMPRESSION: Multiple irregular nodular opacities are noted in left midlung which are slightly more prominent compared  to prior exam and may represent atypical infection such as mycobacterium, but CT scan is recommended to rule out other pathology. Electronically Signed   By: Lupita Raider M.D.   On: 01/10/2023 12:17    Procedures .Critical Care  Performed by: Margarita Grizzle, MD Authorized by: Margarita Grizzle, MD   Critical care provider statement:    Critical care time (minutes):  45   Critical care was time spent personally by me on the following activities:  Development of treatment plan with patient or surrogate, discussions with consultants, evaluation of patient's response to treatment, examination of patient, ordering and review of laboratory studies, ordering and review of radiographic studies, ordering and performing treatments and interventions, pulse oximetry, re-evaluation of patient's condition and review of old charts     Medications Ordered in ED Medications  vancomycin (VANCOCIN) IVPB 1000 mg/200 mL premix (1,000 mg Intravenous New Bag/Given 01/10/23 1451)    Followed by  vancomycin (VANCOCIN) IVPB 1000 mg/200 mL premix (has no administration in time range)  ceFEPIme (MAXIPIME) 1 g in sodium chloride 0.9 % 100 mL IVPB (1 g Intravenous New Bag/Given 01/10/23 1448)    ED Course/ Medical Decision Making/ A&P                             Medical Decision Making Amount and/or Complexity of Data Reviewed Labs: ordered. Radiology: ordered.  Risk Prescription drug management.   67 year old male history of lung transplant presents today with cough and fever. Pain send had bronchoscopy that grew out Staph aureus and has been on Keflex.  He was off Keflex for couple of days this past week.  He began having some worsening symptoms and they restarted his Keflex via phone on Wednesday or Thursday.  He has had generalized weakness increased cough and fevers today. Here in the emergency department patient is hemodynamically stable with a temp of 99.8. Chest x-[place] shows some infiltrates and subsequent  CT showed extensive nodular heterogenous and groundglass airspace opacity to the anterior left upper lobe and lingula with additional clustered nodularity in the right middle lobe and dependent bilateral lower lobes. Maxipime and vancomycin ordered Patient has remained hemodynamically stable Creatinine 2.17 which is stable from first prior Sodium 128 with first prior 134 1 extensive pneumonia and lung transplant patient 2 stable CKD 3-Hyponatremia Care discussed with [facility] transfer center, Jae Dire, RN They will call back to discuss transfer  Discussed with Dr. Acquanetta Belling, on-call for transplant at [facility].  He has accepted patient.  They will call back with bed assignment.  I have discussed this with the patient and his wife.  They voiced understanding of plan. Patient continues to be hemodynamically stable.  Vancomycin is currently infusing. Care discussed with Dr. Wallace Cullens who has assumed care and will coordinate care for transfer       Final Clinical Impression(s) / ED Diagnoses Final diagnoses:  Pneumonia due to infectious organism, unspecified laterality, unspecified part of lung  History of lung transplant [facility])    Rx / DC Orders ED Discharge Orders     None         Margarita Grizzle, MD 01/10/23 1538

## 2023-01-10 NOTE — ED Triage Notes (Addendum)
Pt started on keflex for staph aureus from bronch cultures in lungs approx 2 weeks ago (6/5) and has done 2 rounds of keflex. 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 Pt had a fever per family, tMax 102.  Pt had 2 syncopal episodes yesterday per wife.  Pt woke up covered in sweat.  Last dose of tylenol at midnight last night. Keflex today.

## 2023-01-10 NOTE — ED Notes (Signed)
Eta [id number]

## 2023-01-10 NOTE — ED Provider Notes (Signed)
3:40 PM Patient signed out to me by previous ED physician. Patient is a 67 yo make with hx of lung transplant presenting for sob, fever, chills. CXR demonstrates pneumonia. Never hypoxia.   Mild hyponatremia. 128 Aki constant with previous studies Antibiotics started.    Accepted to [facility]. Pending transfer.   Physical Exam  BP (!) 152/107   Pulse 91   Temp 99.8 F (37.7 C) (Oral)   Resp 17   Ht 5\' 11"  (1.803 m)   Wt 108.9 kg   SpO2 99%   BMI 33.47 kg/m   Physical Exam  Procedures  Procedures  ED Course / MDM    Medical Decision Making Amount and/or Complexity of Data Reviewed Labs: ordered. Radiology: ordered.  Risk Prescription drug management.   ***

## 2023-01-10 NOTE — ED Notes (Signed)
[facility] arrived

## 2023-01-10 NOTE — ED Notes (Signed)
Report given to Dia Sitter, Charity fundraiser at [facility]

## 2023-01-10 NOTE — ED Notes (Signed)
Attempted to call report; Dia Sitter to call me back shortly.

## 2023-01-10 NOTE — Progress Notes (Signed)
Pharmacy Antibiotic Note  Brent Holloway is a 67 y.o. male for which pharmacy has been consulted for cefepime and vancomycin dosing for pneumonia.  Patient with a history of bilateral lung transplant in 2023. Patient presenting with cough and fever. Bronchoscopy that grew out MSSA and has been on Keflex.  CXR w/ infiltrates CT w/ extensive nodular heterogenous and groundglass airspace opacity to the anterior left upper lobe and lingula with additional clustered nodularity in the right middle lobe and dependent bilateral lower lobes   SCr 2.17 - 2.12 in Feb. Late in 2023 was around 1.8 WBC 10.2; LA 0.6; T 103; HR 94; RR 26  Plan: Cefepime 1g x q given in ED -- repeat per attending Vancomycin 2000 mg once then 1750 mg q48hr (eAUC 478.2) unless change in renal function Monitor WBC, fever, renal function, cultures De-escalate when able Levels at steady state F/u MRSA PCR  Height: 5\' 11"  (180.3 cm) Weight: 108.9 kg (240 lb) IBW/kg (Calculated) : 75.3  Temp (24hrs), Avg:99.8 F (37.7 C), Min:99.8 F (37.7 C), Max:99.8 F (37.7 C)  Recent Labs  Lab 01/10/23 1219  WBC 10.2  CREATININE 2.17*  LATICACIDVEN 1.0    Estimated Creatinine Clearance: 41.4 mL/min (A) (by C-G formula based on SCr of 2.17 mg/dL (H)).    No Known Allergies  Microbiology results: Pending  Thank you for allowing pharmacy to be a part of this patient's care.  Delmar Landau, PharmD, BCPS 01/10/2023 2:03 PM ED Clinical Pharmacist -  [phone]

## 2023-01-11 LAB — CULTURE, BLOOD (ROUTINE X 2)

## 2023-01-12 LAB — CULTURE, BLOOD (ROUTINE X 2)

## 2023-01-14 LAB — CULTURE, BLOOD (ROUTINE X 2)

## 2023-01-15 LAB — CULTURE, BLOOD (ROUTINE X 2)
Culture: NO GROWTH
Culture: NO GROWTH
